# Patient Record
Sex: Male | Born: 1935
Health system: Southern US, Community
[De-identification: ages and names within clinical notes are randomized; demographics above are authoritative.]

## PROBLEM LIST (undated history)

## (undated) DIAGNOSIS — E785 Hyperlipidemia, unspecified: Secondary | ICD-10-CM

## (undated) DIAGNOSIS — D649 Anemia, unspecified: Secondary | ICD-10-CM

## (undated) DIAGNOSIS — E119 Type 2 diabetes mellitus without complications: Secondary | ICD-10-CM

## (undated) DIAGNOSIS — I255 Ischemic cardiomyopathy: Secondary | ICD-10-CM

## (undated) DIAGNOSIS — I6529 Occlusion and stenosis of unspecified carotid artery: Secondary | ICD-10-CM

## (undated) DIAGNOSIS — C189 Malignant neoplasm of colon, unspecified: Secondary | ICD-10-CM

## (undated) DIAGNOSIS — I2510 Atherosclerotic heart disease of native coronary artery without angina pectoris: Secondary | ICD-10-CM

## (undated) HISTORY — PX: INGUINAL HERNIA REPAIR: SUR1180

## (undated) HISTORY — DX: Hyperlipidemia, unspecified: E78.5

## (undated) HISTORY — DX: Type 2 diabetes mellitus without complications: E11.9

## (undated) HISTORY — DX: Anemia, unspecified: D64.9

## (undated) HISTORY — PX: CORONARY ANGIOPLASTY WITH STENT PLACEMENT: SHX49

## (undated) HISTORY — DX: Atherosclerotic heart disease of native coronary artery without angina pectoris: I25.10

## (undated) HISTORY — PX: COLECTOMY: SHX59

## (undated) HISTORY — DX: Ischemic cardiomyopathy: I25.5

## (undated) HISTORY — DX: Occlusion and stenosis of unspecified carotid artery: I65.29

## (undated) HISTORY — PX: CATARACT EXTRACTION W/ INTRAOCULAR LENS  IMPLANT, BILATERAL: SHX1307

---

## 2004-04-18 ENCOUNTER — Ambulatory Visit: Payer: Self-pay | Admitting: Family Medicine

## 2004-07-25 ENCOUNTER — Ambulatory Visit: Payer: Self-pay | Admitting: Family Medicine

## 2004-10-03 ENCOUNTER — Ambulatory Visit: Payer: Self-pay | Admitting: Family Medicine

## 2005-01-04 ENCOUNTER — Ambulatory Visit: Payer: Self-pay | Admitting: Family Medicine

## 2005-02-27 ENCOUNTER — Ambulatory Visit: Payer: Self-pay | Admitting: Family Medicine

## 2005-03-23 ENCOUNTER — Ambulatory Visit: Payer: Self-pay | Admitting: Family Medicine

## 2005-05-24 ENCOUNTER — Ambulatory Visit: Payer: Self-pay | Admitting: Family Medicine

## 2005-08-29 ENCOUNTER — Ambulatory Visit: Payer: Self-pay | Admitting: Family Medicine

## 2005-12-04 ENCOUNTER — Ambulatory Visit: Payer: Self-pay | Admitting: Family Medicine

## 2006-03-12 ENCOUNTER — Ambulatory Visit: Payer: Self-pay | Admitting: Family Medicine

## 2006-05-15 ENCOUNTER — Ambulatory Visit: Payer: Self-pay | Admitting: Family Medicine

## 2006-08-20 ENCOUNTER — Ambulatory Visit: Payer: Self-pay | Admitting: Family Medicine

## 2006-11-26 ENCOUNTER — Ambulatory Visit: Payer: Self-pay | Admitting: Family Medicine

## 2007-12-17 ENCOUNTER — Encounter: Admission: RE | Admit: 2007-12-17 | Discharge: 2007-12-30 | Payer: Self-pay | Admitting: Orthopedic Surgery

## 2009-04-14 ENCOUNTER — Ambulatory Visit: Payer: Self-pay | Admitting: Cardiovascular Disease

## 2009-04-14 ENCOUNTER — Ambulatory Visit: Payer: Self-pay | Admitting: Internal Medicine

## 2009-04-14 ENCOUNTER — Inpatient Hospital Stay (HOSPITAL_COMMUNITY): Admission: RE | Admit: 2009-04-14 | Discharge: 2009-04-20 | Payer: Self-pay | Admitting: Cardiology

## 2009-04-15 ENCOUNTER — Encounter: Payer: Self-pay | Admitting: Cardiovascular Disease

## 2009-04-16 ENCOUNTER — Encounter (INDEPENDENT_AMBULATORY_CARE_PROVIDER_SITE_OTHER): Payer: Self-pay | Admitting: Internal Medicine

## 2009-04-18 ENCOUNTER — Encounter: Payer: Self-pay | Admitting: Cardiology

## 2009-04-30 ENCOUNTER — Encounter: Payer: Self-pay | Admitting: Cardiovascular Disease

## 2009-04-30 DIAGNOSIS — I10 Essential (primary) hypertension: Secondary | ICD-10-CM | POA: Insufficient documentation

## 2009-04-30 DIAGNOSIS — I2510 Atherosclerotic heart disease of native coronary artery without angina pectoris: Secondary | ICD-10-CM

## 2009-04-30 DIAGNOSIS — E119 Type 2 diabetes mellitus without complications: Secondary | ICD-10-CM

## 2009-04-30 DIAGNOSIS — I2589 Other forms of chronic ischemic heart disease: Secondary | ICD-10-CM | POA: Insufficient documentation

## 2009-04-30 DIAGNOSIS — E785 Hyperlipidemia, unspecified: Secondary | ICD-10-CM | POA: Insufficient documentation

## 2009-04-30 DIAGNOSIS — I25118 Atherosclerotic heart disease of native coronary artery with other forms of angina pectoris: Secondary | ICD-10-CM | POA: Insufficient documentation

## 2009-05-04 ENCOUNTER — Ambulatory Visit: Payer: Self-pay | Admitting: Cardiovascular Disease

## 2009-06-21 ENCOUNTER — Ambulatory Visit: Payer: Self-pay | Admitting: Cardiology

## 2009-06-21 ENCOUNTER — Ambulatory Visit: Payer: Self-pay

## 2009-06-21 ENCOUNTER — Encounter: Payer: Self-pay | Admitting: Cardiovascular Disease

## 2009-06-21 ENCOUNTER — Ambulatory Visit (HOSPITAL_COMMUNITY): Admission: RE | Admit: 2009-06-21 | Discharge: 2009-06-21 | Payer: Self-pay | Admitting: Cardiovascular Disease

## 2009-06-21 ENCOUNTER — Ambulatory Visit: Payer: Self-pay | Admitting: Cardiovascular Disease

## 2009-06-23 ENCOUNTER — Encounter: Payer: Self-pay | Admitting: Cardiovascular Disease

## 2009-09-17 ENCOUNTER — Ambulatory Visit: Payer: Self-pay | Admitting: Cardiovascular Disease

## 2009-09-17 DIAGNOSIS — R0989 Other specified symptoms and signs involving the circulatory and respiratory systems: Secondary | ICD-10-CM | POA: Insufficient documentation

## 2009-09-24 ENCOUNTER — Encounter: Payer: Self-pay | Admitting: Cardiovascular Disease

## 2009-10-05 ENCOUNTER — Encounter: Payer: Self-pay | Admitting: Cardiovascular Disease

## 2009-10-05 ENCOUNTER — Ambulatory Visit: Payer: Self-pay

## 2009-11-19 ENCOUNTER — Encounter: Payer: Self-pay | Admitting: Cardiovascular Disease

## 2009-12-27 ENCOUNTER — Ambulatory Visit: Payer: Self-pay | Admitting: Cardiovascular Disease

## 2009-12-27 DIAGNOSIS — I739 Peripheral vascular disease, unspecified: Secondary | ICD-10-CM

## 2010-04-06 ENCOUNTER — Ambulatory Visit: Payer: Self-pay

## 2010-04-06 ENCOUNTER — Encounter: Payer: Self-pay | Admitting: Cardiovascular Disease

## 2010-06-09 ENCOUNTER — Ambulatory Visit
Admission: RE | Admit: 2010-06-09 | Discharge: 2010-06-09 | Payer: Self-pay | Source: Home / Self Care | Attending: Cardiovascular Disease | Admitting: Cardiovascular Disease

## 2010-06-09 ENCOUNTER — Encounter: Payer: Self-pay | Admitting: Cardiovascular Disease

## 2010-07-05 NOTE — Assessment & Plan Note (Signed)
Summary: 3 MONTH/DMILLER   Visit Type:  Follow-up Primary Provider:  dr nyland  CC:  sob with exertion and pt states he stopped taken crestor due to leg and feet pain .  History of Present Illness: The patient is an 77 year old gentleman who presented in November 2010 with an acute anterior wall myocardial infarction. He was found to have a totally occluded LAD at cardiac catheterization. He was treated with bare-metal stent. His LVEF initially was 30%. He also had significant disease in the right coronary artery and left circumflex, that is being managed medically. His LV function recovered post-MI with LVEF 55% on f/u echo.  The pt complains of cough and congestion now for over 2 weeks. He has had white sputum, no fever/chills, mild exertional dyspnea. Denies chest pain or edema. No orthopnea or PND.   He was intolerant to crestor and stopped this medication because of leg pain and weakness.  Current Medications (verified): 1)  Aspirin Ec 325 Mg Tbec (Aspirin) .... Take One Tablet By Mouth Daily 2)  Acetaminophen 325 Mg Tabs (Acetaminophen) .... As Needed 3)  Famotidine 40 Mg Tabs (Famotidine) .... Take 1 Tablet By Mouth Once A Day 4)  Levothyroxine Sodium 100 Mcg Tabs (Levothyroxine Sodium) .... Take 1 Tablet By Mouth Once A Day 5)  Nitrostat 0.4 Mg Subl (Nitroglycerin) .[name].. 1 Tablet Under Tongue At Onset of Chest Pain; You May Repeat Every 5 Minutes For Up To 3 Doses. 6)  Potassium Chloride Crys Cr 20 Meq Cr-Tabs (Potassium Chloride Crys Cr) .... Take One Tablet By Mouth Daily 7)  Lisinopril 10 Mg Tabs (Lisinopril) .... Take One Tablet By Mouth Daily 8)  Carvedilol 12.5 Mg Tabs (Carvedilol) .... Take One Tablet By Mouth Twice A Day 9)  Plavix 75 Mg Tabs (Clopidogrel Bisulfate) .... Take One Tablet By Mouth Daily 10)  Furosemide 40 Mg Tabs (Furosemide) .... Take 1 Tablet By Mouth Two Times A Day  Allergies: No Known Drug Allergies  Past History:  Past medical history reviewed for  relevance to current acute and chronic problems.  Past Medical History: Reviewed history from 05/04/2009 and no changes required. Current Problems:  CAD (ICD-414.00) status post anterior wall MI November 2010 ISCHEMIC CARDIOMYOPATHY (ICD-414.8) ESSENTIAL HYPERTENSION (ICD-401.9) HYPERLIPIDEMIA (ICD-272.4) DIABETES MELLITUS, TYPE II (ICD-250.00)  Review of Systems       Negative except as per HPI   Vital Signs:  Patient profile:   77 year old male Height:      71 inches Weight:      168 pounds Pulse rate:   60 / minute Resp:     18 per minute BP sitting:   144 / 70  (right arm)  Vitals Entered By: Kem Parkinson (September 17, 2009 10:05 AM)  Serial Vital Signs/Assessments:  Time      Position  BP       Pulse  Resp  Temp     By           Lying RA  144/70                         Kimalexis Barnes           Lying LA  140/60                         Kimalexis Barnes   Physical Exam  General:  Pt is alert and oriented, elderly male, in no acute distress. HEENT:  normal Neck: normal carotid upstrokes with a right carotid bruit, JVP normal Lungs: clear with end-expiratory wheezing CV: RRR without murmur or gallop Abd: soft, NT, positive BS, no bruit, no organomegaly Ext: no clubbing, cyanosis, or edema. peripheral pulses 2+ and equal Skin: warm and dry without rash    Impression & Recommendations:  Problem # 1:  CAD (ICD-414.00) Pt stable s/p MI with recovery of LV function. He has no symptoms of angina at present.   Continue current medical therapy - tolerating DAPT without bleeding problems.  His updated medication list for this problem includes:    Aspirin Ec 325 Mg Tbec (Aspirin) .[name]... Take one tablet by mouth daily    Nitrostat 0.4 Mg Subl (Nitroglycerin) .[name]... 1 tablet under tongue at onset of chest pain; you may repeat every 5 minutes for up to 3 doses.    Lisinopril 10 Mg Tabs (Lisinopril) .[name]... Take one tablet by mouth daily    Carvedilol 12.5 Mg Tabs (Carvedilol)  .[name]... Take one tablet by mouth twice a day    Plavix 75 Mg Tabs (Clopidogrel bisulfate) .[name]... Take one tablet by mouth daily  Problem # 2:  CAROTID BRUIT (ICD-785.9) Prominent right carotid bruit on exam. Will check a carotid duplex at the time of his f/u visit in 3 months.  Orders: Carotid Duplex (Carotid Duplex)  Problem # 3:  HYPERLIPIDEMIA (ICD-272.4) Intolerant to Crestor. Lipids were at goal on this medication. Will give him a trial of low-dose Pravachol and ask that lipids repeated with Dr Lysbeth Galas in a few months.  The following medications were removed from the medication list:    Crestor 20 Mg Tabs (Rosuvastatin calcium) .[name]... Take 1 tablet by mouth once a day His updated medication list for this problem includes:    Pravastatin Sodium 20 Mg Tabs (Pravastatin sodium) .[name]... Take one tablet by mouth daily at bedtime  Patient Instructions: 1)  Your physician recommends that you schedule a follow-up appointment in: 3months 2)  Your physician has recommended you make the following change in your medication: please stop your afternoon dose of lasix--also start pravochol 20mg  for your cholesterol and start z-pac for your cold 3)  Your physician has requested that you have a carotid duplex. This test is an ultrasound of the carotid arteries in your neck. It looks at blood flow through these arteries that supply the brain with blood. Allow one hour for this exam. There are no restrictions or special instructions. Prescriptions: PRAVASTATIN SODIUM 20 MG TABS (PRAVASTATIN SODIUM) Take one tablet by mouth daily at bedtime  #90 x 0   Entered by:   Ledon Snare, RN   Authorized by:   Norva Karvonen, MD   Signed by:   Ledon Snare, RN on 09/17/2009   Method used:   Electronically to        [facility] [phone]* (retail)       [address] [place], [place]  [address]       Ph: [phone] or [phone]       Fax: [phone]   RxID:    [id number] ZITHROMAX Z-PAK 250 MG TABS (AZITHROMYCIN) take as directed  #1 x 0   Entered by:   Ledon Snare, RN   Authorized by:   Norva Karvonen, MD   Signed by:   Ledon Snare, RN on 09/17/2009   Method used:   Electronically to  [facility] [phone]* (retail)       [address] [place], [place]  [address]       Ph: [phone] or [phone]       Fax: [phone]   RxID:   [id number]

## 2010-07-05 NOTE — Assessment & Plan Note (Signed)
Summary: per check out/also having echo/saf   Visit Type:  Follow-up Primary Provider:  dr nyland  CC:  No complaints.  History of Present Illness: The patient is an 77 year old gentleman who presented in November 2010 with an acute anterior wall myocardial infarction. He was found to have a totally occluded LAD at cardiac catheterization. He was treated with bare-metal stent. His LVEF initially was 30%. He also had significant disease in the right coronary artery and left circumflex, that is being managed medically. He presents today for followup evaluation.  The patient is feeling well at present. He denies chest pain, dyspnea, edema, outpatient, orthopnea, or PND. He lives in an apartment complex and has been walking down the hallway for exercise. He also rides a stationary bike.   Current Medications (verified): 1)  Aspirin Ec 325 Mg Tbec (Aspirin) .... Take One Tablet By Mouth Daily 2)  Acetaminophen 325 Mg Tabs (Acetaminophen) .... As Needed 3)  Famotidine 40 Mg Tabs (Famotidine) .... Take 1 Tablet By Mouth Once A Day 4)  Levothyroxine Sodium 100 Mcg Tabs (Levothyroxine Sodium) .... Take 1 Tablet By Mouth Once A Day 5)  Nitrostat 0.4 Mg Subl (Nitroglycerin) .[name].. 1 Tablet Under Tongue At Onset of Chest Pain; You May Repeat Every 5 Minutes For Up To 3 Doses. 6)  Potassium Chloride Crys Cr 20 Meq Cr-Tabs (Potassium Chloride Crys Cr) .... Take One Tablet By Mouth Daily 7)  Crestor 20 Mg Tabs (Rosuvastatin Calcium) .... Take 1 Tablet By Mouth Once A Day 8)  Lisinopril 10 Mg Tabs (Lisinopril) .... Take One Tablet By Mouth Daily 9)  Carvedilol 12.5 Mg Tabs (Carvedilol) .... Take One Tablet By Mouth Twice A Day 10)  Plavix 75 Mg Tabs (Clopidogrel Bisulfate) .... Take One Tablet By Mouth Daily 11)  Furosemide 40 Mg Tabs (Furosemide) .... Take 1 Tablet By Mouth Two Times A Day  Allergies (verified): No Known Drug Allergies  Past History:  Past medical history reviewed for relevance to  current acute and chronic problems.  Past Medical History: Reviewed history from 05/04/2009 and no changes required. Current Problems:  CAD (ICD-414.00) status post anterior wall MI November 2010 ISCHEMIC CARDIOMYOPATHY (ICD-414.8) ESSENTIAL HYPERTENSION (ICD-401.9) HYPERLIPIDEMIA (ICD-272.4) DIABETES MELLITUS, TYPE II (ICD-250.00)  Review of Systems       Negative except as per HPI   Vital Signs:  Patient profile:   77 year old male Height:      71 inches Weight:      170.25 pounds Pulse rate:   60 / minute Pulse rhythm:   regular Resp:     18 per minute BP sitting:   130 / 62  (left arm) Cuff size:   large  Vitals Entered By: Vikki Ports (June 21, 2009 11:22 AM)  Physical Exam  General:  Pt is alert and oriented, elderly male, in no acute distress. HEENT: normal Neck: normal carotid upstrokes with a right carotid bruits, JVP normal Lungs: CTA CV: RRR without murmur or gallop Abd: soft, NT, positive BS, no bruit, no organomegaly Ext: no clubbing, cyanosis, or edema. peripheral pulses 2+ and equal Skin: warm and dry without rash    Echocardiogram  Procedure date:  06/21/2009  Findings:      Study Conclusions            - Left ventricle: The cavity size was normal. Wall thickness was       normal. Systolic function was normal. The estimated ejection       fraction was in  the range of 55% to 60%. Wall motion was normal;       there were no regional wall motion abnormalities. Doppler       parameters are consistent with abnormal left ventricular       relaxation (grade 1 diastolic dysfunction).     - Aortic valve: Mildly calcified annulus. Trileaflet. Sclerosis       without significant stenosis. Mean gradient: 9mm Hg (S).     - Mitral valve: Trivial regurgitation.     - Left atrium: The atrium was mildly dilated, LA volume 68 mL.     - Common pulmonary vein: The Doppler velocity and flow profile were       normal.     - Right ventricle: The cavity size  was normal. Systolic function was       normal.     - Pulmonary arteries: PA peak pressure: 28mm Hg (S).     - Inferior vena cava: The vessel was normal in size; the       respirophasic diameter changes were in the normal range (= 50%);       findings are consistent with normal central venous pressure.     Impressions:            - Normal LV size and systolic function, EF 55-60%. No regional wall       motion abnormalities. Mild diastolic dysfunction. Aortic sclerosis       without significant stenosis. No pulmonary hypertension.         Impression & Recommendations:  Problem # 1:  CAD (ICD-414.00) The patient is now 2 months out from his myocardial infarction. His echocardiogram images were reviewed this morning. His initial LVEF was approximately 30%, and this is now improved to the normal range. I do not appreciate any wall motion abnormalities. The LVEF is estimated at 55-60%. I reviewed with the patient that this is an excellent prognostic sign following an anterior infarction. He should remain on dual antiplatelet therapy with aspirin and Plavix, as well as secondary risk reduction measures with his statin, ACE inhibitor, and beta blocker. His updated medication list for this problem includes:    Aspirin Ec 325 Mg Tbec (Aspirin) .[name]... Take one tablet by mouth daily    Nitrostat 0.4 Mg Subl (Nitroglycerin) .[name]... 1 tablet under tongue at onset of chest pain; you may repeat every 5 minutes for up to 3 doses.    Lisinopril 10 Mg Tabs (Lisinopril) .[name]... Take one tablet by mouth daily    Carvedilol 12.5 Mg Tabs (Carvedilol) .[name]... Take one tablet by mouth twice a day    Plavix 75 Mg Tabs (Clopidogrel bisulfate) .[name]... Take one tablet by mouth daily  Problem # 2:  ESSENTIAL HYPERTENSION (ICD-401.9) Blood pressure controlled on current medical regimen. His updated medication list for this problem includes:    Aspirin Ec 325 Mg Tbec (Aspirin) .[name]... Take one tablet by mouth daily    Lisinopril  10 Mg Tabs (Lisinopril) .[name]... Take one tablet by mouth daily    Carvedilol 12.5 Mg Tabs (Carvedilol) .[name]... Take one tablet by mouth twice a day    Furosemide 40 Mg Tabs (Furosemide) .[name]... Take 1 tablet by mouth two times a day  BP today: 130/62 Prior BP: 150/67 (05/04/2009)  Problem # 3:  HYPERLIPIDEMIA (ICD-272.4) The patient is nonfasting today. He was written a prescription for a lipid panel and LFTs. He would like to have this drawn at Dr. Joyce Copa office. Goal LDL is less than  70.  His updated medication list for this problem includes:    Crestor 20 Mg Tabs (Rosuvastatin calcium) .[name]... Take 1 tablet by mouth once a day  Patient Instructions: 1)  Your physician recommends that you schedule a follow-up appointment in:  3 months 2)  Your physician recommends that you return for lab work in: labs will be drawn prior to office visit--CMET/LIVER AND LIPIDS--pt reminded to fast

## 2010-07-05 NOTE — Assessment & Plan Note (Signed)
Summary: 3 month rov   Visit Type:  3 MONTHS FOLLOE UP Primary Provider:  dr nyland  CC:  Chest pain last wednesday morning.  History of Present Illness: The patient is an 77 year old gentleman who presented in November 2010 with an acute anterior wall myocardial infarction. He was found to have a totally occluded LAD at cardiac catheterization. He was treated with bare-metal stent. His LVEF initially was 30%. He also had significant disease in the right coronary artery and left circumflex, that is being managed medically. His LV function recovered post-MI with LVEF 55% on f/u echo.  He developed marked anemia on dual antiplatelet Rx, but this has resolved with Iron supplementation. He apparently declined EGD and colonoscopy. He has been off of ASA and plavix since April 2011. He was also intolerant to statin Rx secondary to leg myalgias.  He had a single episode of chest pain last week when he developed substernal pain in the middle of the night. He sat up and belched, then felt better after less than 10 minutes. Denies exertional chest pain or dyspnea. No edema, dizziness, or other complaints.  Current Medications (verified): 1)  Famotidine 40 Mg Tabs (Famotidine) .... Take 1 Tablet By Mouth Once A Day 2)  Levothyroxine Sodium 100 Mcg Tabs (Levothyroxine Sodium) .... Take 1 Tablet By Mouth Once A Day 3)  Nitrostat 0.4 Mg Subl (Nitroglycerin) .[name].. 1 Tablet Under Tongue At Onset of Chest Pain; You May Repeat Every 5 Minutes For Up To 3 Doses. 4)  Potassium Chloride Crys Cr 20 Meq Cr-Tabs (Potassium Chloride Crys Cr) .... Take One Tablet By Mouth Daily 5)  Lisinopril 10 Mg Tabs (Lisinopril) .... Take One Tablet By Mouth Daily 6)  Carvedilol 12.5 Mg Tabs (Carvedilol) .... Take One Tablet By Mouth Twice A Day 7)  Furosemide 40 Mg Tabs (Furosemide) .... Take 1 Tablet By Mouth Two Times A Day  Allergies (verified): No Known Drug Allergies  Past History:  Past medical history reviewed for  relevance to current acute and chronic problems.  Past Medical History: Current Problems:  CAD (ICD-414.00) status post anterior wall MI November 2010 ISCHEMIC CARDIOMYOPATHY (ICD-414.8) ESSENTIAL HYPERTENSION (ICD-401.9) HYPERLIPIDEMIA (ICD-272.4) DIABETES MELLITUS, TYPE II (ICD-250.00) Carotid Stenosis with probable occlusion of the LICA.  Review of Systems       Negative except as per HPI   Vital Signs:  Patient profile:   77 year old male Height:      71 inches Weight:      173.25 pounds BMI:     24.25 Pulse rate:   55 / minute Pulse rhythm:   regular Resp:     18 per minute BP sitting:   140 / 84  (left arm) Cuff size:   large  Vitals Entered By: Vikki Ports (December 27, 2009 9:41 AM)  Physical Exam  General:  Pt is alert and oriented, in no acute distress. HEENT: normal Neck: normal carotid upstrokes with a right carotid bruit, JVP normal Lungs: CTA CV: RRR without murmur or gallop Abd: soft, NT, positive BS, no bruit, no organomegaly Ext: no clubbing, cyanosis, or edema. peripheral pulses 2+ and equal Skin: warm and dry without rash    EKG  Procedure date:  12/27/2009  Findings:      Sinus brady with rare PAC's, HR 55 bpm, nonspecific ST change, otherwise WNL.  Impression & Recommendations:  Problem # 1:  CAD (ICD-414.00) Stable without exertional angina. I suspect the isolated episode of nocturnal pain last week was GI  in origin since it was transient and resolved with belching. Will follow for now. I spoke with Dr Lysbeth Galas, and decided to restart Mr Fuchs on ASA 81 mg daily. He will continue to have CBC monitoring through Dr Joyce Copa office.  The following medications were removed from the medication list:    Aspirin Ec 325 Mg Tbec (Aspirin) .[name]... Take one tablet by mouth daily    Plavix 75 Mg Tabs (Clopidogrel bisulfate) .[name]... Take one tablet by mouth daily His updated medication list for this problem includes:    Nitrostat 0.4 Mg Subl  (Nitroglycerin) .[name]... 1 tablet under tongue at onset of chest pain; you may repeat every 5 minutes for up to 3 doses.    Lisinopril 10 Mg Tabs (Lisinopril) .[name]... Take one tablet by mouth daily    Carvedilol 12.5 Mg Tabs (Carvedilol) .[name]... Take one tablet by mouth twice a day    Aspirin 81 Mg Tbec (Aspirin) .[name]... Take one tablet by mouth daily  Orders: EKG w/ Interpretation (93000)  Problem # 2:  CAROTID ARTERY DISEASE (ICD-433.10) Pt has probable occlusion of the the LICA. He is asymptomatic. Recommend 6 month f/u carotid duplex and I reviewed stroke/TIA symptoms.  The following medications were removed from the medication list:    Aspirin Ec 325 Mg Tbec (Aspirin) .[name]... Take one tablet by mouth daily    Plavix 75 Mg Tabs (Clopidogrel bisulfate) .[name]... Take one tablet by mouth daily His updated medication list for this problem includes:    Aspirin 81 Mg Tbec (Aspirin) .[name]... Take one tablet by mouth daily  Orders: Carotid Duplex (Carotid Duplex)  Problem # 3:  ESSENTIAL HYPERTENSION (ICD-401.9) BP reasonably controlled on current regimen.  The following medications were removed from the medication list:    Aspirin Ec 325 Mg Tbec (Aspirin) .[name]... Take one tablet by mouth daily His updated medication list for this problem includes:    Lisinopril 10 Mg Tabs (Lisinopril) .[name]... Take one tablet by mouth daily    Carvedilol 12.5 Mg Tabs (Carvedilol) .[name]... Take one tablet by mouth twice a day    Furosemide 40 Mg Tabs (Furosemide) .[name]... Take 1 tablet by mouth two times a day    Aspirin 81 Mg Tbec (Aspirin) .[name]... Take one tablet by mouth daily  BP today: 140/84 Prior BP: 144/70 (09/17/2009)  Problem # 4:  HYPERLIPIDEMIA (ICD-272.4) PT hasn't been able to tolerate statins secondary to myalgias. He has been tried on rosuvastatin and pravastatin. I spoke with Dr Lysbeth Galas and he may give him a trial of another agent.  The following medications were removed from the medication list:    Pravastatin  Sodium 20 Mg Tabs (Pravastatin sodium) .[name]... Take one tablet by mouth daily at bedtime  Patient Instructions: 1)  Start Aspirin 81mg  once daily. 2)  Your physician wants you to follow-up in: 6 months.  You will receive a reminder letter in the mail two months in advance. If you don't receive a letter, please call our office to schedule the follow-up appointment. 3)  Your physician has requested that you have a carotid duplex in November. This test is an ultrasound of the carotid arteries in your neck. It looks at blood flow through these arteries that supply the brain with blood. Allow one hour for this exam. There are no restrictions or special instructions.

## 2010-07-07 NOTE — Assessment & Plan Note (Signed)
Summary: ROV   Visit Type:  Follow-up Primary Provider:  dr nyland  CC:  Chest tightness- cough.  History of Present Illness: The patient is an 77 year old gentleman who presented in November 2010 with an acute anterior wall myocardial infarction. He was found to have a totally occluded LAD at cardiac catheterization. He was treated with bare-metal stent. His LVEF initially was 30%. He also had significant disease in the right coronary artery and left circumflex, that is being managed medically. His LV function recovered post-MI with LVEF 55% on f/u echo.  He developed marked anemia on dual antiplatelet Rx, but this has resolved with Iron supplementation. He apparently declined EGD and colonoscopy.  He had been off of both ASA and plavix, but ASA was restarted at his last visit 3 months ago.  Overall he is doing well, but complains of a productive cough and clear sputum. He has some chest tightness with this, but it is relieved after he coughs. Denies dyspnea or exertional chest pain. He has no symptoms like those of his MI. No edema or other complaints. He notes a recent change from an ACE to ARB because of a cough.     Current Medications (verified): 1)  Famotidine 40 Mg Tabs (Famotidine) .... Take 1 Tablet By Mouth Once A Day 2)  Levothyroxine Sodium 100 Mcg Tabs (Levothyroxine Sodium) .... Take 1 Tablet By Mouth Once A Day 3)  Nitrostat 0.4 Mg Subl (Nitroglycerin) .[name].. 1 Tablet Under Tongue At Onset of Chest Pain; You May Repeat Every 5 Minutes For Up To 3 Doses. 4)  Potassium Chloride Crys Cr 20 Meq Cr-Tabs (Potassium Chloride Crys Cr) .... Take One Tablet By Mouth Daily 5)  Losartan Potassium 100 Mg Tabs (Losartan Potassium) 6)  Carvedilol 12.5 Mg Tabs (Carvedilol) .... Take One Tablet By Mouth Twice A Day 7)  Furosemide 40 Mg Tabs (Furosemide) .... Take 1 Tablet By Mouth Two Times A Day 8)  Aspirin 81 Mg Tbec (Aspirin) .... Take One Tablet By Mouth Daily  Allergies (verified): No  Known Drug Allergies  Past History:  Past medical history reviewed for relevance to current acute and chronic problems.  Past Medical History: Current Problems:  CAD (ICD-414.00) status post anterior wall MI November 2010 ISCHEMIC CARDIOMYOPATHY (ICD-414.8) ESSENTIAL HYPERTENSION (ICD-401.9) HYPERLIPIDEMIA (ICD-272.4) DIABETES MELLITUS, TYPE II (ICD-250.00) Carotid Stenosis with chronic occlusion of the LICA and moderate RICA stenosis. Anemia, Fe deficient  Review of Systems       Negative except as per HPI   Vital Signs:  Patient profile:   77 year old male Height:      71 inches Weight:      177 pounds BMI:     24.78 Pulse rate:   49 / minute Pulse rhythm:   irregular Resp:     18 per minute BP sitting:   194 / 100  (left arm) Cuff size:   large  Vitals Entered By: Vikki Ports (June 09, 2010 10:08 AM)  Physical Exam  General:  Pt is alert and oriented, in no acute distress. HEENT: normal Neck: normal carotid upstrokes with a right carotid bruit, JVP normal Lungs: CTA CV: bradycardic and regular without murmur or gallop Abd: soft, NT, positive BS, no bruit, no organomegaly Ext: no clubbing, cyanosis, or edema. peripheral pulses 2+ and equal Skin: warm and dry without rash    EKG  Procedure date:  06/09/2010  Findings:      Sinus brady 49 bpm, PAC, otherwise within normal limits.  Impression & Recommendations:  Problem # 1:  CAD (ICD-414.00) Pt stable without angina. Continue antiplatelet Rx with ASA and f/u 6 months.  His updated medication list for this problem includes:    Nitrostat 0.4 Mg Subl (Nitroglycerin) .[name]... 1 tablet under tongue at onset of chest pain; you may repeat every 5 minutes for up to 3 doses.    Carvedilol 12.5 Mg Tabs (Carvedilol) .[name]... Take one tablet by mouth twice a day    Aspirin 81 Mg Tbec (Aspirin) .[name]... Take one tablet by mouth daily    Amlodipine Besylate 5 Mg Tabs (Amlodipine besylate) .[name]... Take one tablet by mouth  daily  Orders: EKG w/ Interpretation (93000)  Problem # 2:  ESSENTIAL HYPERTENSION (ICD-401.9) Poorly controlled. Add amlodipine 5 mg. Pt to see Dr Lysbeth Galas next week for followup. Continue coreg and losartan.  His updated medication list for this problem includes:    Losartan Potassium 100 Mg Tabs (Losartan potassium)    Carvedilol 12.5 Mg Tabs (Carvedilol) .[name]... Take one tablet by mouth twice a day    Furosemide 40 Mg Tabs (Furosemide) .[name]... Take 1 tablet by mouth two times a day    Aspirin 81 Mg Tbec (Aspirin) .[name]... Take one tablet by mouth daily    Amlodipine Besylate 5 Mg Tabs (Amlodipine besylate) .[name]... Take one tablet by mouth daily  BP today: 194/100 Prior BP: 140/84 (12/27/2009)  Problem # 3:  CAROTID ARTERY DISEASE (ICD-433.10) Chronic LICA occlusion. No neurologic symptoms. Recent carotid duplex reviewed. Continue surveillance duplex scan at 12 month intervals.  His updated medication list for this problem includes:    Aspirin 81 Mg Tbec (Aspirin) .[name]... Take one tablet by mouth daily  Orders: EKG w/ Interpretation (93000)  Problem # 4:  HYPERLIPIDEMIA (ICD-272.4) Pt statin-intolerant.   Orders: EKG w/ Interpretation (93000)  Patient Instructions: 1)  Your physician has recommended you make the following change in your medication: START Amlodipine 5mg  take one tablet by mouth daily 2)  Your physician wants you to follow-up in: 6 MONTHS.  You will receive a reminder letter in the mail two months in advance. If you don't receive a letter, please call our office to schedule the follow-up appointment. Prescriptions: AMLODIPINE BESYLATE 5 MG TABS (AMLODIPINE BESYLATE) Take one tablet by mouth daily  #90 x 3   Entered by:   Julieta Gutting, RN, BSN   Authorized by:   Norva Karvonen, MD   Signed by:   Julieta Gutting, RN, BSN on 06/09/2010   Method used:   Electronically to        [facility] [phone]* (retail)       [address] [place], [place]  [address]       Ph: [phone] or [phone]       Fax: [phone]   RxID:   [id number]

## 2010-09-07 LAB — CBC
HCT: 37.4 % — ABNORMAL LOW (ref 39.0–52.0)
HCT: 44.8 % (ref 39.0–52.0)
Hemoglobin: 12.8 g/dL — ABNORMAL LOW (ref 13.0–17.0)
Hemoglobin: 14.9 g/dL (ref 13.0–17.0)
Hemoglobin: 15.4 g/dL (ref 13.0–17.0)
MCHC: 34.9 g/dL (ref 30.0–36.0)
MCHC: 34.2 g/dL (ref 30.0–36.0)
MCHC: 34.2 g/dL (ref 30.0–36.0)
MCHC: 34.3 g/dL (ref 30.0–36.0)
MCV: 95.6 fL (ref 78.0–100.0)
MCV: 96.5 fL (ref 78.0–100.0)
Platelets: 119 10*3/uL — ABNORMAL LOW (ref 150–400)
Platelets: 153 10*3/uL (ref 150–400)
Platelets: 195 10*3/uL (ref 150–400)
RBC: 3.25 MIL/uL — ABNORMAL LOW (ref 4.22–5.81)
RBC: 3.87 MIL/uL — ABNORMAL LOW (ref 4.22–5.81)
RBC: 4.51 MIL/uL (ref 4.22–5.81)
RDW: 13.4 % (ref 11.5–15.5)
RDW: 13.5 % (ref 11.5–15.5)
RDW: 13.8 % (ref 11.5–15.5)
WBC: 4 10*3/uL (ref 4.0–10.5)
WBC: 10.3 10*3/uL (ref 4.0–10.5)
WBC: 8.8 10*3/uL (ref 4.0–10.5)

## 2010-09-07 LAB — COMPREHENSIVE METABOLIC PANEL
Alkaline Phosphatase: 59 U/L (ref 39–117)
BUN: 13 mg/dL (ref 6–23)
BUN: 14 mg/dL (ref 6–23)
CO2: 22 mEq/L (ref 19–32)
Calcium: 8.9 mg/dL (ref 8.4–10.5)
Chloride: 103 mEq/L (ref 96–112)
Creatinine, Ser: 1.05 mg/dL (ref 0.4–1.5)
GFR calc non Af Amer: >60 mL/min (ref >60)
GFR calc non Af Amer: >60 mL/min (ref >60)
Glucose, Bld: 196 mg/dL — ABNORMAL HIGH (ref 70–99)
Total Bilirubin: 0.7 mg/dL (ref 0.3–1.2)
Total Protein: 6.9 g/dL (ref 6.0–8.3)

## 2010-09-07 LAB — PROTIME-INR
INR: 1.52 — ABNORMAL HIGH (ref 0.00–1.49)
INR: 1.04 (ref 0.00–1.49)
INR: 5.91 (ref 0.00–1.49)
Prothrombin Time: 18.2 seconds — ABNORMAL HIGH (ref 11.6–15.2)
Prothrombin Time: 13.5 seconds (ref 11.6–15.2)
Prothrombin Time: 13.9 seconds (ref 11.6–15.2)
Prothrombin Time: 52.5 seconds — ABNORMAL HIGH (ref 11.6–15.2)

## 2010-09-07 LAB — GLUCOSE, CAPILLARY
Glucose-Capillary: 114 mg/dL — ABNORMAL HIGH (ref 70–99)
Glucose-Capillary: 115 mg/dL — ABNORMAL HIGH (ref 70–99)
Glucose-Capillary: 123 mg/dL — ABNORMAL HIGH (ref 70–99)
Glucose-Capillary: 124 mg/dL — ABNORMAL HIGH (ref 70–99)
Glucose-Capillary: 128 mg/dL — ABNORMAL HIGH (ref 70–99)
Glucose-Capillary: 136 mg/dL — ABNORMAL HIGH (ref 70–99)
Glucose-Capillary: 152 mg/dL — ABNORMAL HIGH (ref 70–99)
Glucose-Capillary: 195 mg/dL — ABNORMAL HIGH (ref 70–99)

## 2010-09-07 LAB — BASIC METABOLIC PANEL
BUN: 31 mg/dL — ABNORMAL HIGH (ref 6–23)
Calcium: 8.5 mg/dL (ref 8.4–10.5)
Calcium: 8.6 mg/dL (ref 8.4–10.5)
Calcium: 8.8 mg/dL (ref 8.4–10.5)
Creatinine, Ser: 1.39 mg/dL (ref 0.4–1.5)
Creatinine, Ser: 1.35 mg/dL (ref 0.4–1.5)
Creatinine, Ser: 1.36 mg/dL (ref 0.4–1.5)
GFR calc Af Amer: 59 mL/min — ABNORMAL LOW (ref >60)
GFR calc Af Amer: >60 mL/min (ref >60)
GFR calc Af Amer: >60 mL/min (ref >60)
GFR calc non Af Amer: 49 mL/min — ABNORMAL LOW (ref >60)
GFR calc non Af Amer: 50 mL/min — ABNORMAL LOW (ref >60)
GFR calc non Af Amer: 50 mL/min — ABNORMAL LOW (ref >60)
GFR calc non Af Amer: 56 mL/min — ABNORMAL LOW (ref >60)
Glucose, Bld: 136 mg/dL — ABNORMAL HIGH (ref 70–99)
Glucose, Bld: 207 mg/dL — ABNORMAL HIGH (ref 70–99)
Potassium: 4.1 mEq/L (ref 3.5–5.1)
Sodium: 134 mEq/L — ABNORMAL LOW (ref 135–145)
Sodium: 136 mEq/L (ref 135–145)
Sodium: 137 mEq/L (ref 135–145)

## 2010-09-07 LAB — CARDIAC PANEL(CRET KIN+CKTOT+MB+TROPI)
CK, MB: 214.9 ng/mL — ABNORMAL HIGH (ref 0.3–4.0)
CK, MB: 7.5 ng/mL — ABNORMAL HIGH (ref 0.3–4.0)
Troponin I: 0.39 ng/mL — ABNORMAL HIGH (ref 0.00–0.06)

## 2010-09-07 LAB — DIFFERENTIAL
Eosinophils Relative: 0 % (ref 0–5)
Lymphocytes Relative: 11 % — ABNORMAL LOW (ref 12–46)
Lymphs Abs: 1 10*3/uL (ref 0.7–4.0)
Monocytes Absolute: 1 10*3/uL (ref 0.1–1.0)

## 2010-09-07 LAB — LIPID PANEL
Total CHOL/HDL Ratio: 5.6 RATIO
VLDL: 16 mg/dL (ref 0–40)

## 2010-09-07 LAB — HEMOGLOBIN A1C: Hgb A1c MFr Bld: 7.4 % — ABNORMAL HIGH (ref 4.6–6.1)

## 2010-09-07 LAB — TSH: TSH: 1.525 u[IU]/mL (ref 0.350–4.500)

## 2010-09-07 LAB — APTT: aPTT: 135 seconds — ABNORMAL HIGH (ref 24–37)

## 2010-09-07 LAB — POCT I-STAT, CHEM 8
BUN: 14 mg/dL (ref 6–23)
Calcium, Ion: 1.17 mmol/L (ref 1.12–1.32)
TCO2: 22 mmol/L (ref 0–100)

## 2010-09-07 LAB — CK TOTAL AND CKMB (NOT AT ARMC): Total CK: 386 U/L — ABNORMAL HIGH (ref 7–232)

## 2010-09-07 LAB — BRAIN NATRIURETIC PEPTIDE: Pro B Natriuretic peptide (BNP): 1032 pg/mL — ABNORMAL HIGH (ref 0.0–100.0)

## 2010-10-18 NOTE — Letter (Signed)
April 20, 2009    Delaney Meigs, MD  [address]  [place], [place] [address]   RE:  Sean Hartman, Sean Hartman  MRN:  [id number]  /  DOB:  1923/03/02   Dear Darcel Bayley:   This note is to follow up on our conversation regarding Sean Hartman.  As we discussed, Sean Hartman came in on November 10 with a large  anterior wall MI.  He was treated with a bare-metal stent in the  proximal LAD, but had a large infarct with severe LV dysfunction.  His  post MI echo raised a question of LV apical thrombus, but without a  clear diagnosis, I elected not to place him on Coumadin because of the  high bleeding risk on combination of aspirin, Plavix, and Coumadin.  He  is leaving the hospital today on full-dose aspirin 325 mg and Plavix 75  mg.  I wanted to follow up with you because I stopped his metformin in  the setting of new systolic heart failure following his infarct.  [name] is going to follow up with you in the next week or two for  reassessment of his type 2 diabetes.  His blood glucoses have been  running from the 80s into the 160s here in the hospital.  He will  receive a formal discharge summary as well.  Overall, Sean Hartman is  doing quite well considering his advanced age and large infarct size.   He is going to follow up with Dr. Antoine Poche in our [facility].   Darcel Bayley, as always, feel free to call at any time if we can be of  further assistance.    Sincerely,      Veverly Fells. Excell Seltzer, MD  Electronically Signed    MDC/MedQ  DD: 04/20/2009  DT: 04/20/2009  Job #: [id number]   CC:    Rollene Rotunda, MD, [facility]

## 2010-12-23 ENCOUNTER — Encounter: Payer: Self-pay | Admitting: Cardiovascular Disease

## 2010-12-27 ENCOUNTER — Ambulatory Visit (INDEPENDENT_AMBULATORY_CARE_PROVIDER_SITE_OTHER): Payer: 59 | Admitting: Cardiovascular Disease

## 2010-12-27 ENCOUNTER — Encounter: Payer: Self-pay | Admitting: Cardiovascular Disease

## 2010-12-27 DIAGNOSIS — E785 Hyperlipidemia, unspecified: Secondary | ICD-10-CM

## 2010-12-27 DIAGNOSIS — I252 Old myocardial infarction: Secondary | ICD-10-CM

## 2010-12-27 DIAGNOSIS — R0989 Other specified symptoms and signs involving the circulatory and respiratory systems: Secondary | ICD-10-CM

## 2010-12-27 DIAGNOSIS — I2510 Atherosclerotic heart disease of native coronary artery without angina pectoris: Secondary | ICD-10-CM

## 2010-12-27 DIAGNOSIS — I10 Essential (primary) hypertension: Secondary | ICD-10-CM

## 2010-12-27 DIAGNOSIS — R0609 Other forms of dyspnea: Secondary | ICD-10-CM

## 2010-12-27 NOTE — Patient Instructions (Signed)
Your physician wants you to follow-up in: 6 months with Dr. Excell Seltzer. You will receive a reminder letter in the mail two months in advance. If you don't receive a letter, please call our office to schedule the follow-up appointment.  Your physician has requested that you have a lexiscan myoview. For further information please visit [email or web address] Please follow instruction sheet, as given.  Your physician has requested that you have an echocardiogram. Echocardiography is a painless test that uses sound waves to create images of your heart. It provides your doctor with information about the size and shape of your heart and how well your heart's chambers and valves are working. This procedure takes approximately one hour. There are no restrictions for this procedure.

## 2011-01-04 ENCOUNTER — Encounter: Payer: Self-pay | Admitting: *Deleted

## 2011-01-05 ENCOUNTER — Ambulatory Visit (HOSPITAL_COMMUNITY): Payer: Medicare HMO | Attending: Cardiovascular Disease | Admitting: Radiology

## 2011-01-05 ENCOUNTER — Ambulatory Visit (HOSPITAL_COMMUNITY): Payer: 59 | Attending: Cardiovascular Disease | Admitting: Radiology

## 2011-01-05 DIAGNOSIS — E119 Type 2 diabetes mellitus without complications: Secondary | ICD-10-CM | POA: Insufficient documentation

## 2011-01-05 DIAGNOSIS — I2589 Other forms of chronic ischemic heart disease: Secondary | ICD-10-CM | POA: Insufficient documentation

## 2011-01-05 DIAGNOSIS — I10 Essential (primary) hypertension: Secondary | ICD-10-CM | POA: Insufficient documentation

## 2011-01-05 DIAGNOSIS — R0609 Other forms of dyspnea: Secondary | ICD-10-CM | POA: Insufficient documentation

## 2011-01-05 DIAGNOSIS — I252 Old myocardial infarction: Secondary | ICD-10-CM | POA: Insufficient documentation

## 2011-01-05 DIAGNOSIS — E785 Hyperlipidemia, unspecified: Secondary | ICD-10-CM | POA: Insufficient documentation

## 2011-01-05 DIAGNOSIS — R0989 Other specified symptoms and signs involving the circulatory and respiratory systems: Secondary | ICD-10-CM | POA: Insufficient documentation

## 2011-01-05 DIAGNOSIS — I2510 Atherosclerotic heart disease of native coronary artery without angina pectoris: Secondary | ICD-10-CM | POA: Insufficient documentation

## 2011-01-05 DIAGNOSIS — R0602 Shortness of breath: Secondary | ICD-10-CM

## 2011-01-05 MED ORDER — TECHNETIUM TC 99M TETROFOSMIN IV KIT
11.0000 | PACK | Freq: Once | INTRAVENOUS | Status: AC | PRN
  Administered 2011-01-05: 11 via INTRAVENOUS

## 2011-01-05 MED ORDER — TECHNETIUM TC 99M TETROFOSMIN IV KIT
33.0000 | PACK | Freq: Once | INTRAVENOUS | Status: AC | PRN
  Administered 2011-01-05: 33 via INTRAVENOUS

## 2011-01-05 MED ORDER — REGADENOSON 0.4 MG/5ML IV SOLN
0.4000 mg | Freq: Once | INTRAVENOUS | Status: AC
  Administered 2011-01-05: 0.4 mg via INTRAVENOUS

## 2011-01-05 NOTE — Progress Notes (Signed)
[facility] SITE 3 NUCLEAR MED [address] [place] [address] [phone]  Cardiology Nuclear Med Study  SACRAMENTO MONDS is a 77 y.o. male [id number] Aug 16, 1922   Nuclear Med Background Indication for Stress Test:  Evaluation for Ischemia, Stent Patency and PTCA Patency History: 11/10 Angioplasty: LAD, 01/11 Echo: EF 55-60%, 11/10 Heart Catheterization: VFIB, 11/10 Myocardial Infarction: AWMI, '94 Myocardial Perfusion Study: inf defect ? ischemia and 11/10 Stents: LAD with residual Cardiac Risk Factors: Carotid Disease, History of Smoking, Hypertension, Lipids and NIDDM  Symptoms:  DOE   Nuclear Pre-Procedure Caffeine/Decaff Intake:  None NPO After: 9:30pm   Lungs:  clear IV 0.9% NS with Angio Cath:  20g  IV Site: R Antecubital  IV Started by:  Stanton Kidney, EMT-P  Chest Size (in):  42 Cup Size: NA  Height: 5\' 11"  (1.803 m)  Weight:  174 lb (78.926 kg)  BMI:  Body mass index is 24.27 kg/(m^2). Tech Comments:  All meds held this morning, per patient    Nuclear Med Study 1 or 2 day study: 1 day  Stress Test Type:  Eugenie Birks  Reading MD: Olga Millers, MD  Order Authorizing Provider:  M.Cooper  Resting Radionuclide: Technetium 15m Tetrofosmin  Resting Radionuclide Dose: 10.7 mCi   Stress Radionuclide:  Technetium 5m Tetrofosmin  Stress Radionuclide Dose: 33.0 mCi           Stress Protocol Rest HR: 56 Stress HR: 76  Rest BP: 147/72 Stress BP: 132/74  Exercise Time (min): n/a METS: n/a   Predicted Max HR: 135 bpm % Max HR: 56.3 bpm Rate Pressure Product: 21308   Dose of Adenosine (mg):  n/a Dose of Lexiscan: 0.4 mg  Dose of Atropine (mg): n/a Dose of Dobutamine: n/a mcg/kg/min (at max HR)  Stress Test Technologist: Milana Na, EMT-P  Nuclear Technologist:  Doyne Keel, CNMT     Rest Procedure:  Myocardial perfusion imaging was performed at rest 45 minutes following the intravenous administration of Technetium 78m Tetrofosmin. Rest ECG:  Sinus Bradycardia  Stress Procedure:  The patient received IV Lexiscan 0.4 mg over 15-seconds.  Technetium 13m Tetrofosmin injected at 30-seconds.  There were no significant changes with Lexiscan.  Quantitative spect images were obtained after a 45 minute delay. Stress ECG: No significant ST segment change suggestive of ischemia.  QPS Raw Data Images:  Acquisition technically good; normal left ventricular size. Stress Images:  There is decreased uptake in the basal anterior wall, inferoseptal wall and apex. Rest Images:  There is decreased uptake in the basal anterior wall, inferoseptal wall and apex. Subtraction (SDS):  No evidence of ischemia. Transient Ischemic Dilatation (Normal <1.22):  0.99 Lung/Heart Ratio (Normal <0.45):  0.27  Quantitative Gated Spect Images QGS EDV:  98 ml QGS ESV:  40 ml QGS cine images:  NL LV Function; NL Wall Motion QGS EF: 59%  Impression Exercise Capacity:  Lexiscan with no exercise. BP Response:  Normal blood pressure response. Clinical Symptoms:  No chest pain. ECG Impression:  No significant ST segment change suggestive of ischemia. Comparison with Prior Nuclear Study: No images to compare  Overall Impression:  Abnormal stress nuclear study with prior basal anterior, inferoseptal infarcts; no ischemia.   Olga Millers

## 2011-01-06 NOTE — Progress Notes (Signed)
Nuclear report routed to Dr. Cooper. Sean Hartman H  

## 2011-01-08 NOTE — Progress Notes (Signed)
No ischemia noted. Continue current medical therapy. 

## 2011-01-11 ENCOUNTER — Telehealth: Payer: Self-pay | Admitting: Cardiovascular Disease

## 2011-01-11 NOTE — Telephone Encounter (Signed)
Pt aware of echo results 

## 2011-01-11 NOTE — Telephone Encounter (Signed)
Test results

## 2011-01-15 ENCOUNTER — Encounter: Payer: Self-pay | Admitting: Cardiovascular Disease

## 2011-01-15 NOTE — Assessment & Plan Note (Signed)
The patient is statin intolerant. He is treated with WelChol.

## 2011-01-15 NOTE — Progress Notes (Signed)
HPI:  This is an 77 year old gentleman presented for followup evaluation. The patient has a history of coronary artery disease and myocardial infarction. He was treated with PCI. He's had no recurrent ischemic event since his initial presentation a few years ago. However, the patient complains of worsening shortness of breath with exertion. He describes dyspnea with fairly low level exertion such as carrying a jug of milk. He denies chest pain or pressure. He has no edema. His symptoms are unlike those at the time of his myocardial infarction when he experienced significant chest pain.  Outpatient Encounter Prescriptions as of 12/27/2010  Medication Sig Dispense Refill  . amLODipine (NORVASC) 5 MG tablet Take 5 mg by mouth daily.        [name] aspirin (ASPIR-81) 81 MG EC tablet Take 81 mg by mouth daily.        . carvedilol (COREG) 12.5 MG tablet Take 12.5 mg by mouth 2 (two) times daily.        . colesevelam (WELCHOL) 625 MG tablet 1 tab twice  A day       . famotidine (PEPCID) 40 MG tablet Take 40 mg by mouth daily.        . furosemide (LASIX) 40 MG tablet Take 40 mg by mouth 2 (two) times daily.        [name] levothyroxine (SYNTHROID, LEVOTHROID) 100 MCG tablet Take 100 mcg by mouth daily.        [name] losartan (COZAAR) 100 MG tablet Take 100 mg by mouth.        . nitroGLYCERIN (NITROSTAT) 0.4 MG SL tablet Place 0.4 mg under the tongue every 5 (five) minutes as needed.        [name] DISCONTD: potassium chloride (KLOR-CON) 20 MEQ packet Take 20 mEq by mouth daily.          No Known Allergies  Past Medical History  Diagnosis Date  . CAD (coronary artery disease)     status post anterior wall MI November 2010  . Ischemic cardiomyopathy   . Essential hypertension   . HLD (hyperlipidemia)   . DM2 (diabetes mellitus, type 2)   . Carotid stenosis     with chronic occlusion of LICA and moderate RICA stenosis  . Anemia     fe deficient    ROS: Negative except as per HPI  BP 150/74  Pulse 55  Ht 5\' 11"   (1.803 m)  Wt 175 lb (79.379 kg)  BMI 24.41 kg/m2  PHYSICAL EXAM: Pt is alert and oriented,elderly male in NAD HEENT: normal Neck: JVP - normal, carotids 2+= with a right carotid bruit Lungs: CTA bilaterally with prolonged expiration CV: RRR without murmur or gallop Abd: soft, NT, Positive BS, no hepatomegaly Ext: no C/C/E, distal pulses intact and equal Skin: warm/dry no rash  EKG:  Sinus bradycardia 51 beats per minute, nonspecific ST/T-wave abnormality.  ASSESSMENT AND PLAN:

## 2011-01-15 NOTE — Assessment & Plan Note (Signed)
Systolic blood pressure is greater than 140, but in this gentleman and his mid 55s and going to stick with his current medical program which includes carvedilol and losartan. He also is on amlodipine. I don't think we can increase his beta blocker anymore because of resting bradycardia.

## 2011-01-15 NOTE — Assessment & Plan Note (Signed)
The patient does not have clear angina, however he does describe progressive dyspnea. I'm concerned that this may be an anginal equivalent.  Will check an echocardiogram to evaluate for new LV dysfunction or diastolic abnormalities. Also will check a pharmacologic nuclear scan to rule out significant ischemia.

## 2011-01-16 NOTE — Progress Notes (Signed)
Left message to call back  

## 2011-01-16 NOTE — Progress Notes (Signed)
Son notified 

## 2011-01-23 ENCOUNTER — Other Ambulatory Visit: Payer: Self-pay

## 2011-01-23 MED ORDER — CARVEDILOL 12.5 MG PO TABS: 12.5000 mg | ORAL_TABLET | Freq: Two times a day (BID) | ORAL | Status: DC

## 2011-02-22 ENCOUNTER — Other Ambulatory Visit: Payer: Self-pay | Admitting: Cardiovascular Disease

## 2011-04-13 ENCOUNTER — Other Ambulatory Visit: Payer: Self-pay | Admitting: Cardiology

## 2011-04-13 DIAGNOSIS — I6529 Occlusion and stenosis of unspecified carotid artery: Secondary | ICD-10-CM

## 2011-04-17 ENCOUNTER — Encounter (INDEPENDENT_AMBULATORY_CARE_PROVIDER_SITE_OTHER): Payer: Medicare HMO | Admitting: Cardiology

## 2011-04-17 DIAGNOSIS — I6529 Occlusion and stenosis of unspecified carotid artery: Secondary | ICD-10-CM

## 2011-04-18 ENCOUNTER — Other Ambulatory Visit: Payer: Self-pay | Admitting: Cardiovascular Disease

## 2011-06-02 ENCOUNTER — Other Ambulatory Visit: Payer: Self-pay | Admitting: Cardiovascular Disease

## 2011-06-05 ENCOUNTER — Other Ambulatory Visit: Payer: Self-pay | Admitting: Cardiovascular Disease

## 2011-06-29 ENCOUNTER — Ambulatory Visit (INDEPENDENT_AMBULATORY_CARE_PROVIDER_SITE_OTHER): Payer: Medicare HMO | Admitting: Cardiovascular Disease

## 2011-06-29 ENCOUNTER — Encounter: Payer: Self-pay | Admitting: Cardiovascular Disease

## 2011-06-29 DIAGNOSIS — I6529 Occlusion and stenosis of unspecified carotid artery: Secondary | ICD-10-CM

## 2011-06-29 DIAGNOSIS — I2510 Atherosclerotic heart disease of native coronary artery without angina pectoris: Secondary | ICD-10-CM

## 2011-06-29 DIAGNOSIS — I10 Essential (primary) hypertension: Secondary | ICD-10-CM

## 2011-06-29 NOTE — Patient Instructions (Signed)
Your physician has requested that you have a carotid duplex in 6 MONTHS. This test is an ultrasound of the carotid arteries in your neck. It looks at blood flow through these arteries that supply the brain with blood. Allow one hour for this exam. There are no restrictions or special instructions.  Your physician wants you to follow-up in: 6 MONTHS. You will receive a reminder letter in the mail two months in advance. If you don't receive a letter, please call our office to schedule the follow-up appointment.  Your physician recommends that you continue on your current medications as directed. Please refer to the Current Medication list given to you today.  Please send [place] a copy of your recent lab results.

## 2011-06-29 NOTE — Progress Notes (Signed)
HPI:  This is an 78 year old gentleman presenting for followup of coronary artery disease. The patient has a history of myocardial infarction treated with primary PCI.  His left ventricular function has been preserved. He is doing well at present and specifically denies chest pain or pressure. He denies dyspnea, edema, orthopnea, or PND. The patient has no complaints today.  Outpatient Encounter Prescriptions as of 06/29/2011  Medication Sig Dispense Refill  . amLODipine (NORVASC) 5 MG tablet TAKE ONE TABLET BY MOUTH ONE TIME DAILY  90 tablet  3  . aspirin (ASPIR-81) 81 MG EC tablet Take 81 mg by mouth daily.        . carvedilol (COREG) 12.5 MG tablet TAKE ONE TABLET BY MOUTH TWICE A DAY  60 tablet  5  . colesevelam (WELCHOL) 625 MG tablet 1 tab twice  A day       . famotidine (PEPCID) 40 MG tablet TAKE ONE TABLET BY MOUTH ONE TIME DAILY  30 tablet  9  . furosemide (LASIX) 40 MG tablet TAKE ONE TABLET BY MOUTH TWICE DAILY  60 tablet  10  . levothyroxine (SYNTHROID, LEVOTHROID) 100 MCG tablet Take 100 mcg by mouth daily.        [name] losartan (COZAAR) 100 MG tablet Take 100 mg by mouth.        . nitroGLYCERIN (NITROSTAT) 0.4 MG SL tablet Place 0.4 mg under the tongue every 5 (five) minutes as needed.          No Known Allergies  Past Medical History  Diagnosis Date  . CAD (coronary artery disease)     status post anterior wall MI November 2010  . Ischemic cardiomyopathy   . Essential hypertension   . HLD (hyperlipidemia)   . DM2 (diabetes mellitus, type 2)   . Carotid stenosis     with chronic occlusion of LICA and moderate RICA stenosis  . Anemia     fe deficient    ROS: Negative except as per HPI  BP 122/66  Pulse 51  Ht 5\' 10"  (1.778 m)  Wt 79.561 kg (175 lb 6.4 oz)  BMI 25.17 kg/m2  PHYSICAL EXAM: Pt is alert and oriented, pleasant elderly man in NAD HEENT: normal Neck: JVP - normal, carotids 2+= with bilateral bruits Lungs: CTA bilaterally CV: RRR without murmur or  gallop Abd: soft, NT, Positive BS, no hepatomegaly Ext: no C/C/E, distal pulses intact and equal Skin: warm/dry no rash  EKG:  Sinus bradycardia with first degree AV block, heart rate 51 beats per minute, otherwise within normal limits.  MYOVIEW: Quantitative Gated Spect Images  QGS EDV: 98 ml  QGS ESV: 40 ml  QGS cine images: NL LV Function; NL Wall Motion  QGS EF: 59%  Impression  Exercise Capacity: Lexiscan with no exercise.  BP Response: Normal blood pressure response.  Clinical Symptoms: No chest pain.  ECG Impression: No significant ST segment change suggestive of ischemia.  Comparison with Prior Nuclear Study: No images to compare  Overall Impression: Abnormal stress nuclear study with prior basal anterior, inferoseptal infarcts; no ischemia.   ASSESSMENT AND PLAN:

## 2011-07-05 NOTE — Assessment & Plan Note (Signed)
The patient has total occlusion of the left internal carotid artery. He has never had a neurologic event. He has moderate right internal carotid artery stenosis and is followed with serial imaging.

## 2011-07-05 NOTE — Assessment & Plan Note (Signed)
Well-controlled on current medical therapy. 

## 2011-07-05 NOTE — Assessment & Plan Note (Signed)
The patient is stable without anginal symptoms. He will continue on his current medical program which includes aspirin, and beta blocker, and an ARB.

## 2011-09-21 ENCOUNTER — Other Ambulatory Visit: Payer: Self-pay | Admitting: *Deleted

## 2011-09-21 MED ORDER — CARVEDILOL 12.5 MG PO TABS: 12.5000 mg | ORAL_TABLET | Freq: Two times a day (BID) | ORAL | Status: DC

## 2011-12-27 ENCOUNTER — Ambulatory Visit: Payer: Medicare HMO | Admitting: Cardiovascular Disease

## 2012-01-03 ENCOUNTER — Encounter (INDEPENDENT_AMBULATORY_CARE_PROVIDER_SITE_OTHER): Payer: Medicare HMO

## 2012-01-03 ENCOUNTER — Ambulatory Visit (INDEPENDENT_AMBULATORY_CARE_PROVIDER_SITE_OTHER): Payer: Medicare HMO | Admitting: Cardiovascular Disease

## 2012-01-03 ENCOUNTER — Ambulatory Visit (INDEPENDENT_AMBULATORY_CARE_PROVIDER_SITE_OTHER)
Admission: RE | Admit: 2012-01-03 | Discharge: 2012-01-03 | Disposition: A | Discharge status: Home / Self Care | Payer: Medicare HMO | Source: Ambulatory Visit | Attending: Cardiovascular Disease | Admitting: Cardiovascular Disease

## 2012-01-03 ENCOUNTER — Encounter: Payer: Self-pay | Admitting: Cardiovascular Disease

## 2012-01-03 VITALS — BP 175/85 | HR 55 | Ht 70.5 in | Wt 171.0 lb

## 2012-01-03 DIAGNOSIS — R05 Cough: Secondary | ICD-10-CM

## 2012-01-03 DIAGNOSIS — I10 Essential (primary) hypertension: Secondary | ICD-10-CM

## 2012-01-03 DIAGNOSIS — I6529 Occlusion and stenosis of unspecified carotid artery: Secondary | ICD-10-CM

## 2012-01-03 DIAGNOSIS — R0602 Shortness of breath: Secondary | ICD-10-CM

## 2012-01-03 DIAGNOSIS — I2510 Atherosclerotic heart disease of native coronary artery without angina pectoris: Secondary | ICD-10-CM

## 2012-01-03 DIAGNOSIS — R0989 Other specified symptoms and signs involving the circulatory and respiratory systems: Secondary | ICD-10-CM

## 2012-01-03 MED ORDER — AZITHROMYCIN 250 MG PO TABS: ORAL_TABLET | ORAL | Status: AC

## 2012-01-03 NOTE — Patient Instructions (Addendum)
Please have a chest x-ray performed at the [place] office located on [facility].  Your physician has recommended you make the following change in your medication: START Z-pak  Your physician wants you to follow-up in: 6 MONTHS with Dr Excell Seltzer. You will receive a reminder letter in the mail two months in advance. If you don't receive a letter, please call our office to schedule the follow-up appointment.

## 2012-01-03 NOTE — Assessment & Plan Note (Signed)
Known carotid disease with no prior history of stroke or TIA. Continue conservative management.

## 2012-01-03 NOTE — Assessment & Plan Note (Signed)
Suspect this is multifactorial. I don't appreciate signs of heart failure, but he probably has diastolic dysfunction considering his ischemic heart disease and advanced age. His exam is suggestive of some component of COPD. With this prolonged cough I think it would be reasonable to give him a course of oral antibiotics as he has failed conservative management. I'm going to have him get a chest x-ray today. He follows up with Dr. Lysbeth Galas next week and this should give some time to see if he has improved with these measures. A prescription for azithromycin will be written.

## 2012-01-03 NOTE — Progress Notes (Signed)
   HPI:  Mr. Sean Hartman returns for followup. He is an 78 year old gentleman with coronary artery disease who presented with an anterior MI about 3 years ago. He was treated with stenting of the LAD and has been noted to have preserved LV function. He was noted to have moderately tight left circumflex stenosis at the time of cardiac cath and he has been treated medically. His LAD was treated with a bare-metal stent platform. A followup echocardiogram in August 2012 demonstrated normal left ventricular systolic function with an ejection fraction of 55-60% and no significant valvular disease.  The patient complains of continued shortness of breath. He's had a cough now for greater than one month. He has taken Mucinex but continues to have productive cough with clear sputum. His breathing has been a little bit worse. He's had no exertional chest pain. He's had some pain in the chest that resolves when he coughs up sputum. He denies orthopnea, PND, or leg swelling. He's had no fever or chills.  Outpatient Encounter Prescriptions as of 01/03/2012  Medication Sig Dispense Refill  . amLODipine (NORVASC) 5 MG tablet TAKE ONE TABLET BY MOUTH ONE TIME DAILY  90 tablet  3  . aspirin (ASPIR-81) 81 MG EC tablet Take 81 mg by mouth daily.        . carvedilol (COREG) 12.5 MG tablet Take 1 tablet (12.5 mg total) by mouth 2 (two) times daily with a meal.  60 tablet  5  . famotidine (PEPCID) 40 MG tablet TAKE ONE TABLET BY MOUTH ONE TIME DAILY  30 tablet  9  . furosemide (LASIX) 40 MG tablet TAKE ONE TABLET BY MOUTH TWICE DAILY  60 tablet  10  . levothyroxine (SYNTHROID, LEVOTHROID) 100 MCG tablet Take 100 mcg by mouth daily.        [name] losartan (COZAAR) 100 MG tablet Take 100 mg by mouth.        . nitroGLYCERIN (NITROSTAT) 0.4 MG SL tablet Place 0.4 mg under the tongue every 5 (five) minutes as needed.        [name] DISCONTD: colesevelam (WELCHOL) 625 MG tablet 1 tab twice  A day         No Known Allergies  Past Medical  History  Diagnosis Date  . CAD (coronary artery disease)     status post anterior wall MI November 2010  . Ischemic cardiomyopathy   . Essential hypertension   . HLD (hyperlipidemia)   . DM2 (diabetes mellitus, type 2)   . Carotid stenosis     with chronic occlusion of LICA and moderate RICA stenosis  . Anemia     fe deficient    ROS: Negative except as per HPI  BP 175/85  Pulse 55  Ht 5' 10.5" (1.791 m)  Wt 171 lb (77.565 kg)  BMI 24.19 kg/m2  PHYSICAL EXAM: Pt is alert and oriented, pleasant elderly male in NAD HEENT: normal Neck: JVP - normal, carotids 2+= with soft bilateral bruits Lungs: CTA bilaterally but prolonged expiratory phase noted CV: RRR without murmur or gallop, distant heart sounds Abd: soft, NT, Positive BS, no hepatomegaly Ext: no C/C/E, distal pulses intact and equal Skin: warm/dry no rash  EKG:  Sinus bradycardia 51 beats per minute, first degree AV block, otherwise within normal limits the  ASSESSMENT AND PLAN:

## 2012-01-03 NOTE — Assessment & Plan Note (Signed)
Blood pressure is elevated today, but the patient is not feeling well and he has a respiratory infection. I'm not going to change his antihypertensive medicines today

## 2012-01-03 NOTE — Assessment & Plan Note (Signed)
The patient is stable without angina. His most recent echocardiogram was reviewed and shows normal LV function. He will continue on his same medications and followup in 6 months

## 2012-01-29 ENCOUNTER — Other Ambulatory Visit: Payer: Self-pay | Admitting: Cardiovascular Disease

## 2012-03-15 ENCOUNTER — Other Ambulatory Visit: Payer: Self-pay | Admitting: Cardiovascular Disease

## 2012-03-15 NOTE — Telephone Encounter (Signed)
Fax Received. Refill Completed. Sean Hartman (R.M.A)   

## 2012-04-22 ENCOUNTER — Emergency Department (HOSPITAL_COMMUNITY): Payer: Medicare HMO

## 2012-04-22 ENCOUNTER — Encounter (HOSPITAL_COMMUNITY): Payer: Self-pay | Admitting: Emergency Medicine

## 2012-04-22 ENCOUNTER — Emergency Department (HOSPITAL_COMMUNITY)
Admission: EM | Admit: 2012-04-22 | Discharge: 2012-04-22 | Disposition: A | Discharge status: Home / Self Care | Payer: Medicare HMO | Attending: Emergency Medicine | Admitting: Emergency Medicine

## 2012-04-22 DIAGNOSIS — S0003XA Contusion of scalp, initial encounter: Secondary | ICD-10-CM | POA: Insufficient documentation

## 2012-04-22 DIAGNOSIS — E119 Type 2 diabetes mellitus without complications: Secondary | ICD-10-CM | POA: Insufficient documentation

## 2012-04-22 DIAGNOSIS — Z87891 Personal history of nicotine dependence: Secondary | ICD-10-CM | POA: Insufficient documentation

## 2012-04-22 DIAGNOSIS — R059 Cough, unspecified: Secondary | ICD-10-CM | POA: Insufficient documentation

## 2012-04-22 DIAGNOSIS — E785 Hyperlipidemia, unspecified: Secondary | ICD-10-CM | POA: Insufficient documentation

## 2012-04-22 DIAGNOSIS — Z79899 Other long term (current) drug therapy: Secondary | ICD-10-CM | POA: Insufficient documentation

## 2012-04-22 DIAGNOSIS — I10 Essential (primary) hypertension: Secondary | ICD-10-CM | POA: Insufficient documentation

## 2012-04-22 DIAGNOSIS — I658 Occlusion and stenosis of other precerebral arteries: Secondary | ICD-10-CM | POA: Insufficient documentation

## 2012-04-22 DIAGNOSIS — Y9389 Activity, other specified: Secondary | ICD-10-CM | POA: Insufficient documentation

## 2012-04-22 DIAGNOSIS — Y9289 Other specified places as the place of occurrence of the external cause: Secondary | ICD-10-CM | POA: Insufficient documentation

## 2012-04-22 DIAGNOSIS — I2510 Atherosclerotic heart disease of native coronary artery without angina pectoris: Secondary | ICD-10-CM | POA: Insufficient documentation

## 2012-04-22 DIAGNOSIS — S0083XA Contusion of other part of head, initial encounter: Secondary | ICD-10-CM | POA: Insufficient documentation

## 2012-04-22 DIAGNOSIS — R42 Dizziness and giddiness: Secondary | ICD-10-CM | POA: Insufficient documentation

## 2012-04-22 DIAGNOSIS — R404 Transient alteration of awareness: Secondary | ICD-10-CM | POA: Insufficient documentation

## 2012-04-22 DIAGNOSIS — R296 Repeated falls: Secondary | ICD-10-CM | POA: Insufficient documentation

## 2012-04-22 DIAGNOSIS — R05 Cough: Secondary | ICD-10-CM | POA: Insufficient documentation

## 2012-04-22 DIAGNOSIS — Z7982 Long term (current) use of aspirin: Secondary | ICD-10-CM | POA: Insufficient documentation

## 2012-04-22 DIAGNOSIS — R55 Syncope and collapse: Secondary | ICD-10-CM | POA: Insufficient documentation

## 2012-04-22 DIAGNOSIS — I2589 Other forms of chronic ischemic heart disease: Secondary | ICD-10-CM | POA: Insufficient documentation

## 2012-04-22 DIAGNOSIS — D509 Iron deficiency anemia, unspecified: Secondary | ICD-10-CM | POA: Insufficient documentation

## 2012-04-22 LAB — CBC WITH DIFFERENTIAL/PLATELET
Basophils Relative: 0 % (ref 0–1)
Eosinophils Absolute: 0.2 10*3/uL (ref 0.0–0.7)
Eosinophils Relative: 3 % (ref 0–5)
Lymphs Abs: 1.2 10*3/uL (ref 0.7–4.0)
MCH: 32 pg (ref 26.0–34.0)
MCHC: 35 g/dL (ref 30.0–36.0)
MCV: 91.5 fL (ref 78.0–100.0)
Platelets: 170 10*3/uL (ref 150–400)
RBC: 4.37 MIL/uL (ref 4.22–5.81)
RDW: 12.8 % (ref 11.5–15.5)

## 2012-04-22 LAB — POCT I-STAT TROPONIN I

## 2012-04-22 LAB — BASIC METABOLIC PANEL
Calcium: 9.5 mg/dL (ref 8.4–10.5)
GFR calc Af Amer: 84 mL/min — ABNORMAL LOW (ref >90)
GFR calc non Af Amer: 72 mL/min — ABNORMAL LOW (ref >90)
Glucose, Bld: 152 mg/dL — ABNORMAL HIGH (ref 70–99)
Sodium: 139 mEq/L (ref 135–145)

## 2012-04-22 NOTE — ED Provider Notes (Signed)
Medical screening examination/treatment/procedure(s) were conducted as a shared visit with non-physician practitioner(s) and myself.  I personally evaluated the patient during the encounter  Patient's labs and x-rays reviewed. Patient evaluated. Large frontal hematoma. Patient likely with cough syncope. Stable for discharge  Toy Baker, MD 04/22/12 [id number]

## 2012-04-22 NOTE — ED Notes (Signed)
Passed out and hit his head has lasrge bruise to rt forehead and cheek

## 2012-04-22 NOTE — ED Provider Notes (Signed)
History     CSN: [id number]  Arrival date & time 04/22/12  1131   First MD Initiated Contact with Patient 04/22/12 1532      No chief complaint on file.   (Consider location/radiation/quality/duration/timing/severity/associated sxs/prior treatment) HPI Comments: Patient presents today after he had a syncopal episode this morning.  Patient reports that he was sitting down and began coughing very hard.  He states that he then became dizzy and had a syncopal event.  He did hit his forehead on the floor when he fell.  He thinks that he loss consciousness lasted for approximately one minute.  He was ambulatory after the event and did not have any symptoms after.  He is currently asymptomatic.  He denies SOB and CP.  He denies dizziness and lightheadedness at this time.  He does have a hematoma on the right side of his forehead.  He is having mild pain at the site of the hematoma, but no other pain.  He denies nausea, vomiting, or vision changes.  Denies neck or back pain.   He is currently not on any blood thinning medication, aside from a daily 81 mg Aspirin.  He reports that he has had a productive cough for several months.  He has a history of COPD.  Denies fever or chills.    The history is provided by the patient.    Past Medical History  Diagnosis Date  . CAD (coronary artery disease)     status post anterior wall MI November 2010  . Ischemic cardiomyopathy   . Essential hypertension   . HLD (hyperlipidemia)   . DM2 (diabetes mellitus, type 2)   . Carotid stenosis     with chronic occlusion of LICA and moderate RICA stenosis  . Anemia     fe deficient    Past Surgical History  Procedure Date  . Left inguinal hernia repair   . Percutaneous transluminal coronary angioplasty and bare-metal stent to left anterior descending     No family history on file.  History  Substance Use Topics  . Smoking status: Former Games developer  . Smokeless tobacco: Never Used  . Alcohol Use: No       Review of Systems  Constitutional: Negative for fever and chills.  HENT: Negative for neck pain and neck stiffness.   Eyes: Negative for visual disturbance.  Respiratory: Positive for cough. Negative for chest tightness, shortness of breath and wheezing.   Cardiovascular: Negative for chest pain, palpitations and leg swelling.  Gastrointestinal: Negative for nausea and vomiting.  Musculoskeletal: Negative for back pain and gait problem.  Skin:       Forehead hematoma  Neurological: Positive for dizziness and syncope. Negative for seizures, facial asymmetry, speech difficulty, weakness, numbness and headaches.  All other systems reviewed and are negative.    Allergies  Review of patient's allergies indicates no known allergies.  Home Medications   Current Outpatient Rx  Name  Route  Sig  Dispense  Refill  . AMLODIPINE BESYLATE 5 MG PO TABS   Oral   Take 5 mg by mouth daily.         . ASPIRIN 81 MG PO TBEC   Oral   Take 81 mg by mouth daily.           [name] CARVEDILOL 12.5 MG PO TABS   Oral   Take 12.5 mg by mouth 2 (two) times daily with a meal.         . MUCINEX DM PO  Oral   Take 10 mLs by mouth every 8 (eight) hours as needed. For cough         . FAMOTIDINE 40 MG PO TABS   Oral   Take 40 mg by mouth daily.         . FUROSEMIDE 40 MG PO TABS   Oral   Take 40 mg by mouth 2 (two) times daily.         [name] LEVOTHYROXINE SODIUM 100 MCG PO TABS   Oral   Take 100 mcg by mouth daily.           [name] LOSARTAN POTASSIUM 100 MG PO TABS   Oral   Take 100 mg by mouth daily.          [name] NITROGLYCERIN 0.4 MG SL SUBL   Sublingual   Place 0.4 mg under the tongue every 5 (five) minutes as needed.             BP 162/58  Pulse 60  Temp 98.3 F (36.8 C) (Oral)  Resp 20  SpO2 94%  Physical Exam  Nursing note and vitals reviewed. Constitutional: He appears well-developed and well-nourished. No distress.  HENT:  Head:    Mouth/Throat: Oropharynx is  clear and moist.  Eyes: Conjunctivae normal and EOM are normal. Pupils are equal, round, and reactive to light.  Neck: Normal range of motion. Neck supple.  Cardiovascular: Normal rate, regular rhythm and normal heart sounds.   Pulmonary/Chest: Effort normal and breath sounds normal.  Abdominal: Soft. There is no tenderness.  Musculoskeletal: Normal range of motion.       Cervical back: He exhibits normal range of motion, no tenderness, no bony tenderness, no swelling and no edema.       Thoracic back: He exhibits normal range of motion, no tenderness, no bony tenderness, no swelling, no edema and no deformity.       Lumbar back: He exhibits normal range of motion, no tenderness, no bony tenderness, no swelling, no edema and no deformity.       Full ROM of all extremities without pain  Neurological: He is alert. He has normal strength. No cranial nerve deficit or sensory deficit. Gait normal.  Skin: Skin is warm and dry. He is not diaphoretic.  Psychiatric: He has a normal mood and affect.    ED Course  Procedures (including critical care time)  Labs Reviewed  BASIC METABOLIC PANEL - Abnormal; Notable for the following:    Glucose, Bld 152 (*)     GFR calc non Af Amer 72 (*)     GFR calc Af Amer 84 (*)     All other components within normal limits  CBC WITH DIFFERENTIAL  POCT I-STAT TROPONIN I    Date: 04/23/2012  Rate:  58  Rhythm: atrial flutter  QRS Axis: normal  Intervals: normal  ST/T Wave abnormalities: normal  Conduction Disutrbances:none  Narrative Interpretation:   Old EKG Reviewed: changes noted   Dg Chest 2 View  04/22/2012  *RADIOLOGY REPORT*  Clinical Data: Cough, dizziness  CHEST - 2 VIEW  Comparison: 01/03/2012  Findings: Chronic left perihilar and lingular densities, suspect scarring.  Stable borderline heart size but normal vascularity.  No CHF or definite pneumonia.  Mild hyperinflation noted, suspect background COPD/emphysema.  No effusion or pneumothorax.   Trachea is midline. Atherosclerosis noted of the aorta.  Mild scoliosis of the spine.  IMPRESSION: Chronic lingula scarring.  Hyperinflation.  No superimposed acute process   Original Report  Authenticated By: Judie Petit. Miles Costain, M.D.    Ct Head Wo Contrast  04/22/2012  *RADIOLOGY REPORT*  Clinical Data: Fall,  large scalp hematoma.  CT HEAD WITHOUT CONTRAST  Technique:  Contiguous axial images were obtained from the base of the skull through the vertex without contrast.  Comparison: None.  Findings: Right frontal large scalp hematoma noted with swelling. No underlying depressed skull fracture.  Mild brain atrophy noted.  No acute intracranial hemorrhage, mass lesion, infarction, midline shift, herniation, or extra-axial fluid collection.  Gray-white matter differentiation maintained. Cisterns patent.  No cerebellar abnormality.  Symmetric orbits. Extensive maxillary sinus opacification compatible with chronic sinus disease and a.  Mastoids clear.  IMPRESSION: Large right frontal scalp hematoma.  No underlying depressed fracture or acute intracranial hemorrhage.  Mild brain atrophy.   Original Report Authenticated By: Judie Petit. Miles Costain, M.D.      No diagnosis found.    MDM  Patient presenting today after a syncopal event that occurred after coughing really hard.  He did hit his head and had a hematoma on his forehead.  CT head negative.  No acute findings on CXR.  Labs unremarkable.  No ischemic changes on EKG.  Troponin negative.  Patient is completely asymptomatic upon arrival in the ED.  Patient did not have any CP or SOB with this syncopal event.  Therefore, feel that patient can be discharged home.  Return precautions discussed.        Pascal Lux [place], PA-C 04/23/12 1535

## 2012-04-22 NOTE — ED Notes (Signed)
Has a bad cold coughed and got dizzy and passed out

## 2012-04-23 NOTE — ED Provider Notes (Signed)
Medical screening examination/treatment/procedure(s) were conducted as a shared visit with non-physician practitioner(s) and myself.  I personally evaluated the patient during the encounter  Sou Nohr T Kemesha Mosey, MD 04/23/12 2240 

## 2012-05-27 ENCOUNTER — Other Ambulatory Visit: Payer: Self-pay | Admitting: Cardiovascular Disease

## 2012-07-04 ENCOUNTER — Encounter: Payer: Self-pay | Admitting: Cardiovascular Disease

## 2012-07-04 ENCOUNTER — Ambulatory Visit (INDEPENDENT_AMBULATORY_CARE_PROVIDER_SITE_OTHER): Payer: Medicare Other | Admitting: Cardiovascular Disease

## 2012-07-04 VITALS — BP 146/78 | HR 63 | Ht 70.0 in | Wt 176.0 lb

## 2012-07-04 DIAGNOSIS — I6529 Occlusion and stenosis of unspecified carotid artery: Secondary | ICD-10-CM

## 2012-07-04 DIAGNOSIS — I2510 Atherosclerotic heart disease of native coronary artery without angina pectoris: Secondary | ICD-10-CM

## 2012-07-04 NOTE — Patient Instructions (Addendum)
Your physician wants you to follow-up in: 6 MONTHS with Dr Cooper.  You will receive a reminder letter in the mail two months in advance. If you don't receive a letter, please call our office to schedule the follow-up appointment.  Your physician has requested that you have a carotid duplex in 6 MONTHS. This test is an ultrasound of the carotid arteries in your neck. It looks at blood flow through these arteries that supply the brain with blood. Allow one hour for this exam. There are no restrictions or special instructions.  Your physician recommends that you continue on your current medications as directed. Please refer to the Current Medication list given to you today.   

## 2012-07-04 NOTE — Progress Notes (Signed)
HPI:  79 year old gentleman presenting for followup evaluation. The patient has coronary artery disease and he initially presented with an anterior wall MI in 2010. He was treated with stenting of the LAD. He had residual coronary disease in the left circumflex and this was treated medically.  Overall patient is doing well. He did have a syncopal episode in December that was associated with heavy coughing. He's had no further episodes of lightheadedness or syncope. He denies chest pain or pressure. He has chronic dyspnea unchanged over time. He was prescribed Spiriva and this has helped. He's been statin intolerant. Dr. Lysbeth Galas gave him some WelChol samples and I have encouraged him to start this.  Outpatient Encounter Prescriptions as of 07/04/2012  Medication Sig Dispense Refill  . amLODipine (NORVASC) 5 MG tablet Take 5 mg by mouth daily.      [name] amLODipine (NORVASC) 5 MG tablet TAKE ONE TABLET BY MOUTH ONE  TIME DAILY  90 tablet  2  . aspirin (ASPIR-81) 81 MG EC tablet Take 81 mg by mouth daily.        . carvedilol (COREG) 12.5 MG tablet Take 12.5 mg by mouth 2 (two) times daily with a meal.      . Dextromethorphan-Guaifenesin (MUCINEX DM PO) Take 10 mLs by mouth every 8 (eight) hours as needed. For cough      . famotidine (PEPCID) 40 MG tablet Take 40 mg by mouth daily.      . furosemide (LASIX) 40 MG tablet Take 40 mg by mouth 2 (two) times daily.      [name] levothyroxine (SYNTHROID, LEVOTHROID) 100 MCG tablet Take 100 mcg by mouth daily.        [name] losartan (COZAAR) 100 MG tablet Take 100 mg by mouth daily.       . nitroGLYCERIN (NITROSTAT) 0.4 MG SL tablet Place 0.4 mg under the tongue every 5 (five) minutes as needed.        [name] SPIRIVA HANDIHALER 18 MCG inhalation capsule Take one tablet daily        No Known Allergies  Past Medical History  Diagnosis Date  . CAD (coronary artery disease)     status post anterior wall MI November 2010  . Ischemic cardiomyopathy   . Essential hypertension    . HLD (hyperlipidemia)   . DM2 (diabetes mellitus, type 2)   . Carotid stenosis     with chronic occlusion of LICA and moderate RICA stenosis  . Anemia     fe deficient   ROS: Positive for knee and ankle pain bilaterally, otherwise negative except as per HPI  BP 146/78  Pulse 63  Ht 5\' 10"  (1.778 m)  Wt 79.833 kg (176 lb)  BMI 25.25 kg/m2  SpO2 94%  PHYSICAL EXAM: Pt is alert and oriented, pleasant elderly male in NAD HEENT: normal Neck: JVP - normal, carotids 2+= with a right carotid bruit Lungs: Clear with prolonged expiration CV: RRR without murmur or gallop Abd: soft, NT, Positive BS, no hepatomegaly Ext: no C/C/E, distal pulses intact and equal Skin: warm/dry no rash  ASSESSMENT AND PLAN: 1. Coronary artery disease, native vessel. The patient is stable without anginal symptoms. We'll continue his current medical program.  2. Essential hypertension. He's on a combination of amlodipine and carvedilol as well as losartan. Will continue his same medical program. Recent labs show creatinine of 0.9. Potassium was 3.6.  3. Carotid stenosis. The patient has chronic occlusion of the left internal carotid artery and 60-79% stenosis on  the right. We are going to repeat her carotid duplex scan when he returns in 6 months.  4. Hyperlipidemia. He is statin intolerant. Recent lipids showed a cholesterol of 195, triglycerides 1:30, HDL 37, and LDL 132. He is going to start WelChol.  Tonny Bollman 07/04/2012 12:50 PM

## 2012-07-06 ENCOUNTER — Other Ambulatory Visit: Payer: Self-pay | Admitting: Cardiovascular Disease

## 2012-07-15 ENCOUNTER — Encounter (INDEPENDENT_AMBULATORY_CARE_PROVIDER_SITE_OTHER): Payer: Medicare Other

## 2012-07-15 DIAGNOSIS — I2510 Atherosclerotic heart disease of native coronary artery without angina pectoris: Secondary | ICD-10-CM

## 2012-07-15 DIAGNOSIS — I6529 Occlusion and stenosis of unspecified carotid artery: Secondary | ICD-10-CM

## 2012-09-12 ENCOUNTER — Other Ambulatory Visit: Payer: Self-pay | Admitting: Cardiovascular Disease

## 2012-11-21 ENCOUNTER — Encounter: Payer: Self-pay | Admitting: Cardiovascular Disease

## 2012-11-28 ENCOUNTER — Other Ambulatory Visit: Payer: Self-pay | Admitting: Cardiovascular Disease

## 2012-11-28 NOTE — Telephone Encounter (Signed)
Medication filled per Julieta Gutting, RN

## 2012-12-30 ENCOUNTER — Ambulatory Visit: Payer: Medicare Other | Admitting: Cardiovascular Disease

## 2012-12-31 ENCOUNTER — Ambulatory Visit: Payer: Medicare Other | Admitting: Cardiovascular Disease

## 2013-01-10 ENCOUNTER — Encounter: Payer: Self-pay | Admitting: Cardiovascular Disease

## 2013-01-10 ENCOUNTER — Ambulatory Visit (INDEPENDENT_AMBULATORY_CARE_PROVIDER_SITE_OTHER): Payer: Medicare Other | Admitting: Cardiovascular Disease

## 2013-01-10 VITALS — BP 132/60 | HR 58 | Ht 71.0 in | Wt 169.4 lb

## 2013-01-10 DIAGNOSIS — I252 Old myocardial infarction: Secondary | ICD-10-CM

## 2013-01-10 DIAGNOSIS — I2510 Atherosclerotic heart disease of native coronary artery without angina pectoris: Secondary | ICD-10-CM

## 2013-01-10 MED ORDER — FLUTICASONE PROPIONATE 50 MCG/ACT NA SUSP: 2.0000 | Freq: Every day | NASAL | Status: DC

## 2013-01-10 NOTE — Progress Notes (Signed)
HPI:  79 year old gentleman presenting for followup evaluation. The patient is followed for coronary artery disease. He's had an anterior MI with good recovery of LV function. He also has carotid disease with chronic occlusion of the left internal carotid artery and 40-59% stenosis of the right internal carotid artery. He has no history of stroke or TIA. His last carotid duplex was in February 2014 and 1 year followup is recommended.  His only complaint is sinus congestion and difficulty breathing at night. He denies chest pain, cough, orthopnea, PND, edema, amaurosis, or extremity weakness. We discussed his history of statin intolerance and he cannot take these medications because of severe myalgias and weakness. He was unable to take WelChol because of constipation.  Outpatient Encounter Prescriptions as of 01/10/2013  Medication Sig Dispense Refill  . amLODipine (NORVASC) 5 MG tablet Take 5 mg by mouth daily.      [name] aspirin (ASPIR-81) 81 MG EC tablet Take 81 mg by mouth daily.        . carvedilol (COREG) 12.5 MG tablet TAKE ONE TABLET BY MOUTH TWICE A DAY WITH MEALS  60 tablet  4  . Dextromethorphan-Guaifenesin (MUCINEX DM PO) Take 10 mLs by mouth every 8 (eight) hours as needed. For cough      . famotidine (PEPCID) 40 MG tablet TAKE ONE TABLET BY MOUTH ONE TIME DAILY  30 tablet  8  . furosemide (LASIX) 40 MG tablet Take 40 mg by mouth 2 (two) times daily.      [name] levothyroxine (SYNTHROID, LEVOTHROID) 100 MCG tablet Take 100 mcg by mouth daily.        [name] losartan (COZAAR) 100 MG tablet Take 100 mg by mouth daily.       . nitroGLYCERIN (NITROSTAT) 0.4 MG SL tablet Place 0.4 mg under the tongue every 5 (five) minutes as needed.        [name] SPIRIVA HANDIHALER 18 MCG inhalation capsule Take one tablet daily      . fluticasone (FLONASE) 50 MCG/ACT nasal spray Place 2 sprays into the nose daily.  16 g  6  . [DISCONTINUED] famotidine (PEPCID) 40 MG tablet Take 40 mg by mouth daily.      . [DISCONTINUED]  furosemide (LASIX) 40 MG tablet TAKE ONE TABLET BY MOUTH TWICE DAILY  60 tablet  10   No facility-administered encounter medications on file as of 01/10/2013.    No Known Allergies  Past Medical History  Diagnosis Date  . CAD (coronary artery disease)     status post anterior wall MI November 2010  . Ischemic cardiomyopathy   . Essential hypertension   . HLD (hyperlipidemia)   . DM2 (diabetes mellitus, type 2)   . Carotid stenosis     with chronic occlusion of LICA and moderate RICA stenosis  . Anemia     fe deficient    ROS: Negative except as per HPI  BP 132/60  Pulse 58  Ht 5\' 11"  (1.803 m)  Wt 76.839 kg (169 lb 6.4 oz)  BMI 23.64 kg/m2  SpO2 94%  PHYSICAL EXAM: Pt is alert and oriented, pleasant elderly man in NAD HEENT: normal Neck: JVP - normal, carotids 2+= with bilateral bruits, high-pitched on the left Lungs: CTA bilaterally CV: RRR without murmur or gallop Abd: soft, NT, Positive BS, no hepatomegaly Ext: no C/C/E, distal pulses intact and equal Skin: warm/dry no rash  EKG:  Sinus rhythm 58 beats per minute, within normal limits. Rare PAC.  ASSESSMENT AND PLAN: 1. Coronary  artery disease, native vessel. The patient is stable without anginal symptoms. He will continue on aspirin, carvedilol, and losartan. I'll see him back in 6 months.  2. Carotid stenosis without history of stroke or TIA. Followup carotid duplex in February 2015 with an office visit that same day. Continue risk reduction measures.  3. Hypertension. Blood pressure is well controlled on his current program which includes a calcium blocker, beta blocker, and ARB.  4. Sinus congestion. He requested antibiotics. There is no sign of infection. I wrote him a prescription for Flonase.  He brought labs in for review today. Labs from June 19 showed a creatinine of 1.03, normal liver function tests, cholesterol 205, trig was read 163, HDL 31, and LDL 141. We discussed statin drugs and non-statin  lipid-lowering drugs. He is not interested in cholesterol-lowering medication after discussion of the pros and cons.  Tonny Bollman 01/10/2013 1:23 PM

## 2013-01-10 NOTE — Patient Instructions (Addendum)
Your physician wants you to follow-up in: February 2015 You will receive a reminder letter in the mail two months in advance. If you don't receive a letter, please call our office to schedule the follow-up appointment.   Start flonase two sprays to each nostril once daily  Your physician has requested that you have a carotid duplex. This test is an ultrasound of the carotid arteries in your neck. It looks at blood flow through these arteries that supply the brain with blood. Allow one hour for this exam. There are no restrictions or special instructions. Schedule in February sameday as appt with dr Excell Seltzer

## 2013-02-07 ENCOUNTER — Ambulatory Visit: Payer: Medicare Other | Admitting: Cardiovascular Disease

## 2013-02-11 ENCOUNTER — Other Ambulatory Visit: Payer: Self-pay | Admitting: Cardiovascular Disease

## 2013-02-28 ENCOUNTER — Other Ambulatory Visit: Payer: Self-pay | Admitting: Cardiovascular Disease

## 2013-07-08 ENCOUNTER — Ambulatory Visit (HOSPITAL_COMMUNITY): Payer: Medicare HMO | Attending: Cardiology

## 2013-07-08 DIAGNOSIS — I658 Occlusion and stenosis of other precerebral arteries: Secondary | ICD-10-CM | POA: Insufficient documentation

## 2013-07-08 DIAGNOSIS — I2510 Atherosclerotic heart disease of native coronary artery without angina pectoris: Secondary | ICD-10-CM | POA: Insufficient documentation

## 2013-07-08 DIAGNOSIS — E119 Type 2 diabetes mellitus without complications: Secondary | ICD-10-CM | POA: Insufficient documentation

## 2013-07-08 DIAGNOSIS — I10 Essential (primary) hypertension: Secondary | ICD-10-CM | POA: Insufficient documentation

## 2013-07-08 DIAGNOSIS — E785 Hyperlipidemia, unspecified: Secondary | ICD-10-CM | POA: Insufficient documentation

## 2013-07-08 DIAGNOSIS — I6529 Occlusion and stenosis of unspecified carotid artery: Secondary | ICD-10-CM | POA: Insufficient documentation

## 2013-07-09 ENCOUNTER — Encounter: Payer: Self-pay | Admitting: Cardiovascular Disease

## 2013-07-09 ENCOUNTER — Ambulatory Visit (INDEPENDENT_AMBULATORY_CARE_PROVIDER_SITE_OTHER): Payer: Medicare HMO | Admitting: Cardiovascular Disease

## 2013-07-09 VITALS — BP 134/58 | HR 64 | Ht 71.0 in | Wt 170.4 lb

## 2013-07-09 DIAGNOSIS — I2510 Atherosclerotic heart disease of native coronary artery without angina pectoris: Secondary | ICD-10-CM

## 2013-07-09 NOTE — Patient Instructions (Signed)
Your physician recommends that you continue on your current medications as directed. Please refer to the Current Medication list given to you today.  Your physician wants you to follow-up in: 6 months with Dr. Cooper.  You will receive a reminder letter in the mail two months in advance. If you don't receive a letter, please call our office to schedule the follow-up appointment.  

## 2013-07-09 NOTE — Progress Notes (Signed)
HPI:  80 year old gentleman presenting for followup evaluation. The patient has coronary artery disease and he initially presented with an anterior MI in 2010. He was treated with PCI of the LAD and has done well since that time. He also has carotid stenosis with chronic occlusion of the left internal carotid artery and moderate stenosis of the right internal carotid artery. He has no history of clinical stroke or TIA.  The patient has been doing well. He denies cough, shortness of breath, or chest pain. He's had no leg swelling. He does experience pain in both ankles and lower legs with ambulation. No symptoms with standing. Pain resolves with rest.  Outpatient Encounter Prescriptions as of 07/09/2013  Medication Sig  . amLODipine (NORVASC) 5 MG tablet Take 5 mg by mouth daily.  [name] aspirin (ASPIR-81) 81 MG EC tablet Take 81 mg by mouth daily.    . carvedilol (COREG) 12.5 MG tablet TAKE ONE TABLET BY MOUTH TWICE A DAY WITH MEALS  . Dextromethorphan-Guaifenesin (MUCINEX DM PO) Take 10 mLs by mouth every 8 (eight) hours as needed. For cough  . famotidine (PEPCID) 40 MG tablet TAKE ONE TABLET BY MOUTH ONE TIME DAILY  . fluticasone (FLONASE) 50 MCG/ACT nasal spray Place 2 sprays into the nose daily.  . furosemide (LASIX) 40 MG tablet Take 40 mg by mouth 2 (two) times daily.  [name] levothyroxine (SYNTHROID, LEVOTHROID) 100 MCG tablet Take 100 mcg by mouth daily.    [name] losartan (COZAAR) 100 MG tablet Take 100 mg by mouth daily.   . nitroGLYCERIN (NITROSTAT) 0.4 MG SL tablet Place 0.4 mg under the tongue every 5 (five) minutes as needed.    [name] SPIRIVA HANDIHALER 18 MCG inhalation capsule Take one tablet daily  . [DISCONTINUED] amLODipine (NORVASC) 5 MG tablet TAKE ONE TABLET BY MOUTH ONE TIME DAILY    No Known Allergies  Past Medical History  Diagnosis Date  . CAD (coronary artery disease)     status post anterior wall MI November 2010  . Ischemic cardiomyopathy   . Essential hypertension   . HLD  (hyperlipidemia)   . DM2 (diabetes mellitus, type 2)   . Carotid stenosis     with chronic occlusion of LICA and moderate RICA stenosis  . Anemia     fe deficient    ROS: Negative except as per HPI  BP 134/58  Pulse 64  Ht 5\' 11"  (1.803 m)  Wt 170 lb 6.4 oz (77.293 kg)  BMI 23.78 kg/m2  SpO2 96%  PHYSICAL EXAM: Pt is alert and oriented, pleasant elderly male in NAD HEENT: normal Neck: JVP - normal, carotids 2+= with bilateral bruits Lungs: CTA bilaterally CV: RRR without murmur or gallop Abd: soft, NT, Positive BS, no hepatomegaly Ext: no C/C/E, distal pulses intact and equal Skin: warm/dry no rash  ASSESSMENT AND PLAN: 1. Coronary artery disease, native vessel. The patient is stable without symptoms of angina. He will continue on his current medical program. This was reviewed today and includes aspirin, a beta blocker, calcium channel blocker, and losartan. He's had presumed GI blood loss on dual antiplatelet therapy.  2. Carotid stenosis with no history of stroke. Will repeat carotid duplex in 1 year. I reviewed his recent study which demonstrated stability of the right internal carotid artery and chronic occlusion of the left.  3. Hyperlipidemia. He is unable to tolerate lipid-lowering therapy. He has tried statin and on statin drugs and has had side effects to all of these.  4. Hypertension.  Blood pressure is well controlled on his current regimen.  For followup I will see him back in 6 months.  [name] 07/09/2013 1:59 PM

## 2013-07-14 ENCOUNTER — Other Ambulatory Visit: Payer: Self-pay | Admitting: Cardiovascular Disease

## 2013-08-27 ENCOUNTER — Other Ambulatory Visit: Payer: Self-pay | Admitting: Cardiovascular Disease

## 2013-09-02 ENCOUNTER — Other Ambulatory Visit: Payer: Self-pay | Admitting: Cardiovascular Disease

## 2013-09-02 NOTE — Telephone Encounter (Signed)
Is this ok to refill for patient? Please advise. Thanks, MI

## 2013-09-17 DIAGNOSIS — E1159 Type 2 diabetes mellitus with other circulatory complications: Secondary | ICD-10-CM | POA: Insufficient documentation

## 2013-09-17 DIAGNOSIS — I152 Hypertension secondary to endocrine disorders: Secondary | ICD-10-CM | POA: Insufficient documentation

## 2013-09-17 DIAGNOSIS — E1169 Type 2 diabetes mellitus with other specified complication: Secondary | ICD-10-CM | POA: Insufficient documentation

## 2013-10-16 ENCOUNTER — Encounter: Payer: Self-pay | Admitting: Cardiovascular Disease

## 2013-10-16 NOTE — Telephone Encounter (Signed)
This encounter was created in error - please disregard.

## 2013-10-16 NOTE — Telephone Encounter (Signed)
Left message on machine for pt's son to contact the office and speak with a scheduler. The pt needs to be scheduled for a ROV with Dr Burt Knack in [place].

## 2013-10-16 NOTE — Telephone Encounter (Signed)
New Message:  Pt's son is requesting his father be worked in sometime in August or September for a f/u w/ Burt Knack. Requests a call back from the nurse

## 2013-11-10 ENCOUNTER — Other Ambulatory Visit: Payer: Self-pay | Admitting: Cardiovascular Disease

## 2013-11-13 ENCOUNTER — Other Ambulatory Visit: Payer: Self-pay | Admitting: Cardiovascular Disease

## 2013-11-28 ENCOUNTER — Other Ambulatory Visit: Payer: Self-pay | Admitting: Cardiovascular Disease

## 2013-12-17 DIAGNOSIS — J449 Chronic obstructive pulmonary disease, unspecified: Secondary | ICD-10-CM | POA: Insufficient documentation

## 2014-02-06 ENCOUNTER — Other Ambulatory Visit (HOSPITAL_COMMUNITY): Payer: Self-pay | Admitting: *Deleted

## 2014-02-06 DIAGNOSIS — I6529 Occlusion and stenosis of unspecified carotid artery: Secondary | ICD-10-CM

## 2014-02-18 ENCOUNTER — Encounter: Payer: Self-pay | Admitting: Cardiovascular Disease

## 2014-02-18 ENCOUNTER — Ambulatory Visit (INDEPENDENT_AMBULATORY_CARE_PROVIDER_SITE_OTHER): Payer: Medicare HMO | Admitting: Cardiovascular Disease

## 2014-02-18 ENCOUNTER — Ambulatory Visit (HOSPITAL_COMMUNITY): Payer: Medicare HMO | Attending: Cardiovascular Disease | Admitting: Cardiology

## 2014-02-18 VITALS — BP 112/68 | HR 51 | Ht 71.0 in | Wt 163.0 lb

## 2014-02-18 DIAGNOSIS — I2510 Atherosclerotic heart disease of native coronary artery without angina pectoris: Secondary | ICD-10-CM | POA: Diagnosis not present

## 2014-02-18 DIAGNOSIS — I10 Essential (primary) hypertension: Secondary | ICD-10-CM | POA: Insufficient documentation

## 2014-02-18 DIAGNOSIS — E119 Type 2 diabetes mellitus without complications: Secondary | ICD-10-CM | POA: Insufficient documentation

## 2014-02-18 DIAGNOSIS — I2589 Other forms of chronic ischemic heart disease: Secondary | ICD-10-CM

## 2014-02-18 DIAGNOSIS — E785 Hyperlipidemia, unspecified: Secondary | ICD-10-CM | POA: Insufficient documentation

## 2014-02-18 DIAGNOSIS — I6529 Occlusion and stenosis of unspecified carotid artery: Secondary | ICD-10-CM | POA: Diagnosis not present

## 2014-02-18 NOTE — Progress Notes (Signed)
Carotid duplex performed 

## 2014-02-18 NOTE — Patient Instructions (Signed)
Your physician wants you to follow-up in: 1 YEAR with Dr Burt Knack.  You will receive a reminder letter in the mail two months in advance. If you don't receive a letter, please call our office to schedule the follow-up appointment.  Your physician has requested that you have a carotid duplex in 1 YEAR. This test is an ultrasound of the carotid arteries in your neck. It looks at blood flow through these arteries that supply the brain with blood. Allow one hour for this exam. There are no restrictions or special instructions.  Your physician recommends that you continue on your current medications as directed. Please refer to the Current Medication list given to you today.

## 2014-02-18 NOTE — Progress Notes (Signed)
HPI:  80 year old gentleman presenting for followup evaluation. The patient has coronary artery disease and he initially presented with an anterior MI in 2010. He was treated with PCI of the LAD and has done well since that time. He also has carotid stenosis with chronic occlusion of the left internal carotid artery and moderate stenosis of the right internal carotid artery. He has no history of clinical stroke or TIA.  The patient is doing well. He denies chest pain or pressure. No lightheadedness, dizziness, amaurosis fugax, speech problems, or extremity weakness. His biggest limitation relates to pain in his feet. He has no cardiac related complaints today. Chronic dyspnea is unchanged.   Outpatient Encounter Prescriptions as of 02/18/2014  Medication Sig  . amLODipine (NORVASC) 5 MG tablet TAKE ONE TABLET BY MOUTH ONE TIME DAILY  . aspirin (ASPIR-81) 81 MG EC tablet Take 81 mg by mouth daily.    . carvedilol (COREG) 12.5 MG tablet TAKE ONE TABLET BY MOUTH TWICE A DAY WITH MEALS  . colesevelam (WELCHOL) 625 MG tablet Take 1,875 mg by mouth as needed.  [name] Dextromethorphan-Guaifenesin (MUCINEX DM PO) Take 10 mLs by mouth every 8 (eight) hours as needed. For cough  . famotidine (PEPCID) 40 MG tablet TAKE ONE TABLET BY MOUTH ONE TIME DAILY  . fluticasone (FLONASE) 50 MCG/ACT nasal spray Place 2 sprays into the nose daily.  . furosemide (LASIX) 40 MG tablet TAKE ONE TABLET BY MOUTH TWICE DAILY  . levothyroxine (SYNTHROID, LEVOTHROID) 100 MCG tablet Take 100 mcg by mouth daily.    [name] losartan (COZAAR) 100 MG tablet Take 100 mg by mouth daily.   . nitroGLYCERIN (NITROSTAT) 0.4 MG SL tablet Place 0.4 mg under the tongue every 5 (five) minutes as needed.    . sitaGLIPtin (JANUVIA) 100 MG tablet Take 100 mg by mouth as needed.  [name] SPIRIVA HANDIHALER 18 MCG inhalation capsule Take one tablet daily  . [DISCONTINUED] amLODipine (NORVASC) 5 MG tablet Take 5 mg by mouth daily.  . [DISCONTINUED] amLODipine  (NORVASC) 5 MG tablet TAKE ONE TABLET BY MOUTH ONE TIME DAILY  . [DISCONTINUED] furosemide (LASIX) 40 MG tablet Take 40 mg by mouth 2 (two) times daily.  . [DISCONTINUED] furosemide (LASIX) 40 MG tablet TAKE ONE TABLET BY MOUTH TWICE DAILY    No Known Allergies  Past Medical History  Diagnosis Date  . CAD (coronary artery disease)     status post anterior wall MI November 2010  . Ischemic cardiomyopathy   . Essential hypertension   . HLD (hyperlipidemia)   . DM2 (diabetes mellitus, type 2)   . Carotid stenosis     with chronic occlusion of LICA and moderate RICA stenosis  . Anemia     fe deficient    ROS: Negative except as per HPI  BP 112/68  Pulse 51  Ht 5\' 11"  (1.803 m)  Wt 163 lb (73.936 kg)  BMI 22.74 kg/m2  PHYSICAL EXAM: Pt is alert and oriented, elderly gentleman in NAD HEENT: normal Neck: JVP - normal, carotids 2+= with a right carotid bruit Lungs: CTA bilaterally CV: RRR without murmur or gallop, distant heart sounds Abd: soft, NT, Positive BS, no hepatomegaly Ext: no C/C/E Skin: warm/dry no rash  EKG:  Sinus bradycardia 51 beats per minute, otherwise within normal limits.  ASSESSMENT AND PLAN: 1. Coronary artery disease, native vessel. The patient is stable without symptoms of angina. He will continue on his current medical program. This was reviewed today and includes aspirin at  low dose, a beta blocker, calcium channel blocker, and losartan.   2. Carotid stenosis with no history of stroke. Preliminary report from carotid study earlier today demonstrated stability with chronic left internal carotid artery occlusion and moderate 60-79% stenosis on the right. No stroke or TIA symptoms.  3. Hyperlipidemia. He is unable to tolerate lipid-lowering therapy. He has tried statin and non-statin drugs and has had side effects to all of these.   4. Hypertension. Blood pressure is well controlled on his current regimen.   I will see him back in one year with a  carotid duplex scan the same day as his office visit.   [name] MD 02/18/2014 12:03 PM

## 2014-03-04 ENCOUNTER — Other Ambulatory Visit: Payer: Self-pay

## 2014-03-04 MED ORDER — AMLODIPINE BESYLATE 5 MG PO TABS: ORAL_TABLET | ORAL | Status: DC

## 2014-03-10 ENCOUNTER — Other Ambulatory Visit: Payer: Self-pay | Admitting: *Deleted

## 2014-03-10 MED ORDER — FUROSEMIDE 40 MG PO TABS: ORAL_TABLET | ORAL | Status: DC

## 2014-04-27 ENCOUNTER — Other Ambulatory Visit: Payer: Self-pay | Admitting: Cardiovascular Disease

## 2014-04-28 ENCOUNTER — Other Ambulatory Visit: Payer: Self-pay | Admitting: Cardiovascular Disease

## 2014-05-12 ENCOUNTER — Other Ambulatory Visit: Payer: Self-pay | Admitting: Cardiovascular Disease

## 2014-11-26 ENCOUNTER — Other Ambulatory Visit: Payer: Self-pay | Admitting: Cardiovascular Disease

## 2015-03-24 ENCOUNTER — Other Ambulatory Visit: Payer: Self-pay | Admitting: Cardiovascular Disease

## 2015-03-24 DIAGNOSIS — I6523 Occlusion and stenosis of bilateral carotid arteries: Secondary | ICD-10-CM

## 2015-03-25 ENCOUNTER — Other Ambulatory Visit: Payer: Self-pay | Admitting: Cardiovascular Disease

## 2015-03-31 ENCOUNTER — Ambulatory Visit (HOSPITAL_COMMUNITY)
Admission: RE | Admit: 2015-03-31 | Discharge: 2015-03-31 | Disposition: A | Discharge status: Home / Self Care | Payer: Medicare HMO | Source: Ambulatory Visit | Attending: Cardiology | Admitting: Cardiology

## 2015-03-31 ENCOUNTER — Ambulatory Visit (INDEPENDENT_AMBULATORY_CARE_PROVIDER_SITE_OTHER): Payer: Medicare HMO | Admitting: Cardiovascular Disease

## 2015-03-31 ENCOUNTER — Encounter: Payer: Self-pay | Admitting: Cardiovascular Disease

## 2015-03-31 VITALS — BP 142/78 | HR 44 | Ht 71.0 in | Wt 161.0 lb

## 2015-03-31 DIAGNOSIS — I6523 Occlusion and stenosis of bilateral carotid arteries: Secondary | ICD-10-CM | POA: Diagnosis not present

## 2015-03-31 DIAGNOSIS — I10 Essential (primary) hypertension: Secondary | ICD-10-CM

## 2015-03-31 DIAGNOSIS — E785 Hyperlipidemia, unspecified: Secondary | ICD-10-CM | POA: Insufficient documentation

## 2015-03-31 DIAGNOSIS — R001 Bradycardia, unspecified: Secondary | ICD-10-CM

## 2015-03-31 DIAGNOSIS — E119 Type 2 diabetes mellitus without complications: Secondary | ICD-10-CM | POA: Insufficient documentation

## 2015-03-31 MED ORDER — CARVEDILOL 6.25 MG PO TABS: 6.2500 mg | ORAL_TABLET | Freq: Two times a day (BID) | ORAL | Status: DC

## 2015-03-31 NOTE — Progress Notes (Signed)
Cardiology Office Note Date:  04/02/2015   ID:  Sean Hartman, DOB 07/21/1922, MRN [id number]  PCP:  [name], MD  Cardiologist:  [name], MD    Chief Complaint  Patient presents with  . Leg Pain   History of Present Illness: Sean Hartman is a 81 y.o. male who presents for followup evaluation. The patient has coronary artery disease and he initially presented with an anterior MI in 2010. He was treated with PCI of the LAD and has done well since that time. He also has carotid stenosis with chronic occlusion of the left internal carotid artery and moderate stenosis of the right internal carotid artery. He has no history of clinical stroke or TIA.   the patient is doing well. He recently celebrated his 89th birthday. His primary limitation is bilateral foot pain with walking. He denies shortness of breath or chest pain  At rest. He denies lightheadedness, dizziness, or syncope.  He does admit to shortness of breath with activity, unchanged over time. He denies orthopnea or PND. He denies leg swelling.  Past Medical History  Diagnosis Date  . CAD (coronary artery disease)     status post anterior wall MI November 2010  . Ischemic cardiomyopathy   . Essential hypertension   . HLD (hyperlipidemia)   . DM2 (diabetes mellitus, type 2) ([place])   . Carotid stenosis     with chronic occlusion of LICA and moderate RICA stenosis  . Anemia     fe deficient    Past Surgical History  Procedure Laterality Date  . Left inguinal hernia repair    . Percutaneous transluminal coronary angioplasty and bare-metal stent to left anterior descending      Current Outpatient Prescriptions  Medication Sig Dispense Refill  . amLODipine (NORVASC) 5 MG tablet TAKE ONE TABLET BY MOUTH ONE  TIME DAILY 90 tablet 1  . aspirin (ASPIR-81) 81 MG EC tablet Take 81 mg by mouth daily.      . carvedilol (COREG) 6.25 MG tablet Take 1 tablet (6.25 mg total) by mouth 2 (two) times daily  with a meal. 60 tablet 11  . famotidine (PEPCID) 40 MG tablet TAKE ONE TABLET BY MOUTH ONE TIME DAILY 30 tablet 0  . furosemide (LASIX) 40 MG tablet TAKE ONE TABLET BY MOUTH TWICE DAILY 60 tablet 10  . levothyroxine (SYNTHROID, LEVOTHROID) 100 MCG tablet Take 100 mcg by mouth daily.      [name] losartan (COZAAR) 100 MG tablet Take 100 mg by mouth daily.     . nitroGLYCERIN (NITROSTAT) 0.4 MG SL tablet DISSOLVE 1 TABLET UNDER TONGUE EVERY 5 MINUTES AS NEEDED FOR CHEST PAIN UP TO 3 DOSES 25 tablet 6  . Dextromethorphan-Guaifenesin (MUCINEX DM PO) Take 10 mLs by mouth every 8 (eight) hours as needed. For cough    . fluticasone (FLONASE) 50 MCG/ACT nasal spray Place 2 sprays into the nose daily. (Patient not taking: Reported on 03/31/2015) 16 g 6  . sitaGLIPtin (JANUVIA) 100 MG tablet Take 100 mg by mouth as needed.    [name] SPIRIVA HANDIHALER 18 MCG inhalation capsule Place 18 mcg into inhaler and inhale daily as needed.      No current facility-administered medications for this visit.    Allergies:   Review of patient's allergies indicates no known allergies.   Social History:  The patient  reports that he has quit smoking. He has never used smokeless tobacco. He reports that he does not drink alcohol.   Family  History:  The patient's  family history is not on file.    ROS:  Please see the history of present illness.  Otherwise, review of systems is positive for  Hearing loss, cough, shortness of breath with activity, blood in stool, leg pain, constipation, balance problems.  All other systems are reviewed and negative.    PHYSICAL EXAM: VS:  BP 142/78 mmHg  Pulse 44  Ht 5\' 11"  (1.803 m)  Wt 161 lb (73.029 kg)  BMI 22.46 kg/m2 , BMI Body mass index is 22.46 kg/(m^2). GEN: Well nourished, well developed, pleasant elderly male in no acute distress HEENT: normal Neck: no JVD, no masses. Bilateral carotid bruits Cardiac: bradycardiac and regular without murmur or gallop  Distant heart sounds               Respiratory:  clear to auscultation bilaterally, normal work of breathing, prolonged expiration GI: soft, nontender, nondistended, + BS MS: no deformity or atrophy Ext: no pretibial edema Skin: warm and dry, no rash Neuro:  Strength and sensation are intact Psych: euthymic mood, full affect  EKG:  EKG is ordered today. The ekg ordered today shows  Marked sinus bradycardia 44 bpm, otherwise within normal limits.  Recent Labs: No results found for requested labs within last 365 days.   Lipid Panel     Component Value Date/Time   CHOL * 04/15/2009 0320    212        ATP III CLASSIFICATION:  <200     mg/dL   Desirable  200-239  mg/dL   Borderline High  >=240    mg/dL   High          TRIG 79 04/15/2009 0320   HDL 38* 04/15/2009 0320   CHOLHDL 5.6 04/15/2009 0320   VLDL 16 04/15/2009 0320   LDLCALC * 04/15/2009 0320    158        Total Cholesterol/HDL:CHD Risk Coronary Heart Disease Risk Table                     Men   Women  1/2 Average Risk   3.4   3.3  Average Risk       5.0   4.4  2 X Average Risk   9.6   7.1  3 X Average Risk  23.4   11.0        Use the calculated Patient Ratio above and the CHD Risk Table to determine the patient's CHD Risk.        ATP III CLASSIFICATION (LDL):  <100     mg/dL   Optimal  100-129  mg/dL   Near or Above                    Optimal  130-159  mg/dL   Borderline  160-189  mg/dL   High  >190     mg/dL   Very High      Wt Readings from Last 3 Encounters:  03/31/15 161 lb (73.029 kg)  02/18/14 163 lb (73.936 kg)  07/09/13 170 lb 6.4 oz (77.293 kg)     Cardiac Studies Reviewed:  carotid duplex: Pending  ASSESSMENT AND PLAN: 1.   CAD, native vessel, without symptoms of angina: The patient will continue on aspirin, a beta blocker at reduced dose , and ARB, and lipid-lowering therapy with WelChol because of statin intolerance.  2. Bradycardia, asymptomatic: Recommend reducing carvedilol from 12.5 down to 6.25 mg twice daily  3. Essential hypertension: Blood pressure controlled on amlodipine, carvedilol, and losartan  4. Carotid stenosis without history of stroke: last year's duplex scan was reviewed. A repeat duplex was performed today with the results currently pending. Will follow-up once results available. He is on appropriate medical therapy with no signs of stroke or TIA.  Current medicines are reviewed with the patient today.  The patient does not have concerns regarding medicines.  Labs/ tests ordered today include:   Orders Placed This Encounter  Procedures  . EKG 12-Lead    Disposition:   FU one year  Signed, [name], MD  04/02/2015 9:17 PM    [address] [address], [place], [place]  [address] Phone: [phone]; Fax: [phone]

## 2015-03-31 NOTE — Patient Instructions (Signed)
Medication Instructions:  Your physician has recommended you make the following change in your medication:  1. DECREASE Carvedilol to 6.25mg  take one tablet by mouth twice a day  Labwork: No new orders.   Testing/Procedures: No new orders.   Follow-Up: Your physician wants you to follow-up in: 1 YEAR with Dr Burt Knack.  You will receive a reminder letter in the mail two months in advance. If you don't receive a letter, please call our office to schedule the follow-up appointment.   Any Other Special Instructions Will Be Listed Below (If Applicable).     If you need a refill on your cardiac medications before your next appointment, please call your pharmacy.

## 2015-04-02 ENCOUNTER — Encounter: Payer: Self-pay | Admitting: Cardiovascular Disease

## 2015-04-23 ENCOUNTER — Other Ambulatory Visit: Payer: Self-pay | Admitting: Cardiovascular Disease

## 2015-05-31 ENCOUNTER — Other Ambulatory Visit: Payer: Self-pay | Admitting: Cardiovascular Disease

## 2015-07-22 ENCOUNTER — Other Ambulatory Visit: Payer: Self-pay | Admitting: Cardiovascular Disease

## 2015-07-22 MED ORDER — FUROSEMIDE 40 MG PO TABS: ORAL_TABLET | ORAL | Status: DC

## 2015-11-03 DIAGNOSIS — R918 Other nonspecific abnormal finding of lung field: Secondary | ICD-10-CM | POA: Insufficient documentation

## 2015-11-03 DIAGNOSIS — R5382 Chronic fatigue, unspecified: Secondary | ICD-10-CM | POA: Insufficient documentation

## 2015-11-03 DIAGNOSIS — D509 Iron deficiency anemia, unspecified: Secondary | ICD-10-CM | POA: Insufficient documentation

## 2015-11-03 DIAGNOSIS — R195 Other fecal abnormalities: Secondary | ICD-10-CM | POA: Insufficient documentation

## 2015-11-05 DIAGNOSIS — C187 Malignant neoplasm of sigmoid colon: Secondary | ICD-10-CM | POA: Insufficient documentation

## 2015-11-09 ENCOUNTER — Encounter: Payer: Self-pay | Admitting: Cardiovascular Disease

## 2015-11-09 ENCOUNTER — Ambulatory Visit (INDEPENDENT_AMBULATORY_CARE_PROVIDER_SITE_OTHER): Payer: Medicare HMO | Admitting: Cardiovascular Disease

## 2015-11-09 VITALS — BP 150/52 | HR 52 | Ht 71.0 in | Wt 161.0 lb

## 2015-11-09 DIAGNOSIS — I2510 Atherosclerotic heart disease of native coronary artery without angina pectoris: Secondary | ICD-10-CM | POA: Diagnosis not present

## 2015-11-09 DIAGNOSIS — C19 Malignant neoplasm of rectosigmoid junction: Secondary | ICD-10-CM | POA: Insufficient documentation

## 2015-11-09 DIAGNOSIS — I10 Essential (primary) hypertension: Secondary | ICD-10-CM | POA: Diagnosis not present

## 2015-11-09 NOTE — Progress Notes (Signed)
Cardiology Office Note Date:  11/09/2015   ID:  CY BARRISH, DOB Sep 27, 1922, MRN [id number]  PCP:  [name], MD  Cardiologist:  [name], MD    Chief Complaint  Patient presents with  . Coronary Artery Disease    No chest pain,shortness of breath,lee,or claudication  . essential hypertension    History of Present Illness: Sean Hartman is a 82 y.o. male who presents for preoperative cardiac evaluation.  The patient has coronary artery disease and he initially presented with an anterior MI in 2010. He was treated with PCI of the LAD and has done well since that time. He also has carotid stenosis with chronic occlusion of the left internal carotid artery and moderate stenosis of the right internal carotid artery. He has no history of clinical stroke or TIA.  The patient was recently hospitalized with anemia, hemoglobin a proximally 6 mg/dL. He complained of symptoms of shortness of breath, weakness, and lightheadedness. Even with his significant anemia, he had no chest pain or pressure. He was ultimately diagnosed with a colon mass and he is referred now for preoperative evaluation for colon surgery. The patient underwent colonoscopy and blood transfusion. He's feeling much better now. He is here with his son today. He is to have surgery in the near future.  Past Medical History  Diagnosis Date  . CAD (coronary artery disease)     status post anterior wall MI November 2010  . Ischemic cardiomyopathy   . Essential hypertension   . HLD (hyperlipidemia)   . DM2 (diabetes mellitus, type 2) ([place])   . Carotid stenosis     with chronic occlusion of LICA and moderate RICA stenosis  . Anemia     fe deficient    Past Surgical History  Procedure Laterality Date  . Left inguinal hernia repair    . Percutaneous transluminal coronary angioplasty and bare-metal stent to left anterior descending      Current Outpatient Prescriptions  Medication Sig Dispense  Refill  . amLODipine (NORVASC) 5 MG tablet TAKE ONE TABLET BY MOUTH ONE TIME DAILY 90 tablet 1  . aspirin (ASPIR-81) 81 MG EC tablet Take 81 mg by mouth daily.      . carvedilol (COREG) 6.25 MG tablet Take 1 tablet (6.25 mg total) by mouth 2 (two) times daily with a meal. 60 tablet 11  . Dextromethorphan-Guaifenesin (MUCINEX DM PO) Take 10 mLs by mouth every 8 (eight) hours as needed. For cough    . famotidine (PEPCID) 40 MG tablet TAKE ONE TABLET BY MOUTH ONE  TIME DAILY 30 tablet 11  . ferrous sulfate 325 (65 FE) MG tablet Take 1 tablet by mouth every morning.    . furosemide (LASIX) 40 MG tablet TAKE ONE TABLET BY MOUTH TWICE DAILY 60 tablet 10  . gabapentin (NEURONTIN) 100 MG capsule Take 100 mg by mouth daily.    [name] levothyroxine (SYNTHROID, LEVOTHROID) 100 MCG tablet Take 100 mcg by mouth daily.      [name] losartan (COZAAR) 100 MG tablet Take 100 mg by mouth daily.     . nitroGLYCERIN (NITROSTAT) 0.4 MG SL tablet DISSOLVE 1 TABLET UNDER TONGUE EVERY 5 MINUTES AS NEEDED FOR CHEST PAIN UP TO 3 DOSES 25 tablet 6  . sitaGLIPtin (JANUVIA) 100 MG tablet Take 100 mg by mouth as needed (diabetes).     . SPIRIVA HANDIHALER 18 MCG inhalation capsule Place 18 mcg into inhaler and inhale daily as needed (shortness of breath).  No current facility-administered medications for this visit.    Allergies:   Review of patient's allergies indicates no known allergies.   Social History:  The patient  reports that he has quit smoking. He has never used smokeless tobacco. He reports that he does not drink alcohol.   Family History:  The patient's family history is not on file.    ROS:  Please see the history of present illness.  Otherwise, review of systems is positive for Leg swelling, hearing loss, shortness of breath, blood in stool, muscle pain, dizziness, leg pain, constipation.  All other systems are reviewed and negative.    PHYSICAL EXAM: VS:  BP 150/52 mmHg  Pulse 52  Ht 5\' 11"  (1.803 m)  Wt  161 lb (73.029 kg)  BMI 22.46 kg/m2 , BMI Body mass index is 22.46 kg/(m^2). GEN: Well nourished, well developed, pleasant elderly male in no acute distress HEENT: normal Neck: no JVD, no masses. bilateral carotid bruits Cardiac: RRR without murmur or gallop                Respiratory:  clear to auscultation bilaterally, normal work of breathing GI: soft, nontender, nondistended, + BS MS: no deformity or atrophy Ext: trace pretibial edema Skin: warm and dry, no rash Neuro:  Strength and sensation are intact Psych: euthymic mood, full affect  EKG:  EKG is ordered today. The ekg ordered today shows sinus bradycardia 53 bpm, otherwise within normal limits.  Recent Labs: No results found for requested labs within last 365 days.   Lipid Panel     Component Value Date/Time   CHOL * 04/15/2009 0320    212        ATP III CLASSIFICATION:  <200     mg/dL   Desirable  200-239  mg/dL   Borderline High  >=240    mg/dL   High          TRIG 79 04/15/2009 0320   HDL 38* 04/15/2009 0320   CHOLHDL 5.6 04/15/2009 0320   VLDL 16 04/15/2009 0320   LDLCALC * 04/15/2009 0320    158        Total Cholesterol/HDL:CHD Risk Coronary Heart Disease Risk Table                     Men   Women  1/2 Average Risk   3.4   3.3  Average Risk       5.0   4.4  2 X Average Risk   9.6   7.1  3 X Average Risk  23.4   11.0        Use the calculated Patient Ratio above and the CHD Risk Table to determine the patient's CHD Risk.        ATP III CLASSIFICATION (LDL):  <100     mg/dL   Optimal  100-129  mg/dL   Near or Above                    Optimal  130-159  mg/dL   Borderline  160-189  mg/dL   High  >190     mg/dL   Very High      Wt Readings from Last 3 Encounters:  11/09/15 161 lb (73.029 kg)  03/31/15 161 lb (73.029 kg)  02/18/14 163 lb (73.936 kg)    ASSESSMENT AND PLAN: 1.  CAD, native vessel: no angina. He's been stable without recurrent ischemic events since his initial presentation in  2010.  He has no symptoms of heart failure. His cardiac risk of surgery is moderate based on his known carotid and coronary disease as well as his advanced age. However, surgery is necessary to potentially cure localized colon cancer and treat a source of serious GI bleeding. Preoperative cardiac testing is not indicated. He should continue on his current cardiac medical program in the perioperative period.   2. Sinus bradycardia: asymptomatic. Continue observation.  3. Essential HTN: BP reasonably controlled at his advanced age.   4. Carotid stenosis without history of stroke: known chronic occlusion of the LICA. Moderate stenosis of the RICA. No hx of cerebrovascular events.   Current medicines are reviewed with the patient today.  The patient does not have concerns regarding medicines.  Labs/ tests ordered today include:  Orders Placed This Encounter  Procedures  . EKG 12-Lead   Disposition:   FU 6 months  Signed, [name], MD  11/09/2015 4:44 PM    [facility] [address], [place], [place]  [address] Phone: [phone]; Fax: [phone]

## 2015-11-09 NOTE — Patient Instructions (Signed)

## 2015-11-19 DIAGNOSIS — Z9049 Acquired absence of other specified parts of digestive tract: Secondary | ICD-10-CM

## 2015-11-19 HISTORY — DX: Acquired absence of other specified parts of digestive tract: Z90.49

## 2015-12-16 ENCOUNTER — Other Ambulatory Visit: Payer: Self-pay

## 2015-12-16 MED ORDER — AMLODIPINE BESYLATE 5 MG PO TABS: 5.0000 mg | ORAL_TABLET | Freq: Every day | ORAL | Status: DC

## 2016-05-02 ENCOUNTER — Other Ambulatory Visit: Payer: Self-pay

## 2016-05-02 DIAGNOSIS — I10 Essential (primary) hypertension: Secondary | ICD-10-CM

## 2016-05-02 DIAGNOSIS — R001 Bradycardia, unspecified: Secondary | ICD-10-CM

## 2016-05-02 MED ORDER — CARVEDILOL 6.25 MG PO TABS: 6.2500 mg | ORAL_TABLET | Freq: Two times a day (BID) | ORAL | 1 refills | Status: DC

## 2016-05-03 MED ORDER — FAMOTIDINE 40 MG PO TABS: 40.0000 mg | ORAL_TABLET | Freq: Every day | ORAL | 2 refills | Status: DC

## 2016-05-03 MED ORDER — CARVEDILOL 6.25 MG PO TABS: 6.2500 mg | ORAL_TABLET | Freq: Two times a day (BID) | ORAL | 2 refills | Status: DC

## 2016-05-03 NOTE — Addendum Note (Signed)
Addended by: Derl Barrow on: 05/03/2016 09:28 AM   Modules accepted: Orders

## 2016-06-16 ENCOUNTER — Other Ambulatory Visit: Payer: Self-pay | Admitting: *Deleted

## 2016-06-16 MED ORDER — AMLODIPINE BESYLATE 5 MG PO TABS: 5.0000 mg | ORAL_TABLET | Freq: Every day | ORAL | 1 refills | Status: DC

## 2016-06-30 ENCOUNTER — Other Ambulatory Visit: Payer: Self-pay | Admitting: Cardiovascular Disease

## 2016-06-30 DIAGNOSIS — I6523 Occlusion and stenosis of bilateral carotid arteries: Secondary | ICD-10-CM

## 2016-07-03 ENCOUNTER — Encounter: Payer: Self-pay | Admitting: Cardiovascular Disease

## 2016-07-03 ENCOUNTER — Ambulatory Visit (HOSPITAL_COMMUNITY)
Admission: RE | Admit: 2016-07-03 | Discharge: 2016-07-03 | Disposition: A | Discharge status: Home / Self Care | Payer: Medicare HMO | Source: Ambulatory Visit | Attending: Cardiovascular Disease | Admitting: Cardiovascular Disease

## 2016-07-03 ENCOUNTER — Ambulatory Visit (INDEPENDENT_AMBULATORY_CARE_PROVIDER_SITE_OTHER): Payer: Medicare HMO | Admitting: Cardiovascular Disease

## 2016-07-03 VITALS — BP 140/70 | HR 52 | Ht 71.0 in | Wt 162.0 lb

## 2016-07-03 DIAGNOSIS — R001 Bradycardia, unspecified: Secondary | ICD-10-CM

## 2016-07-03 DIAGNOSIS — I2510 Atherosclerotic heart disease of native coronary artery without angina pectoris: Secondary | ICD-10-CM

## 2016-07-03 DIAGNOSIS — I6523 Occlusion and stenosis of bilateral carotid arteries: Secondary | ICD-10-CM

## 2016-07-03 DIAGNOSIS — I10 Essential (primary) hypertension: Secondary | ICD-10-CM

## 2016-07-03 MED ORDER — CARVEDILOL 3.125 MG PO TABS: 3.1250 mg | ORAL_TABLET | Freq: Two times a day (BID) | ORAL | 11 refills | Status: DC

## 2016-07-03 NOTE — Patient Instructions (Signed)
Medication Instructions:  Your physician has recommended you make the following change in your medication:  1. DECREASE Carvedilol to 3.125mg  take one tablet by mouth twice a day  Labwork: No new orders.   Testing/Procedures: No new orders.   Follow-Up: Your physician wants you to follow-up in: 1 YEAR with Dr Burt Knack.  You will receive a reminder letter in the mail two months in advance. If you don't receive a letter, please call our office to schedule the follow-up appointment.   Any Other Special Instructions Will Be Listed Below (If Applicable).     If you need a refill on your cardiac medications before your next appointment, please call your pharmacy.

## 2016-07-03 NOTE — Progress Notes (Signed)
Cardiology Office Note Date:  07/03/2016   ID:  Sean Hartman, DOB Apr 25, 1923, MRN [id number]  PCP:  [name], MD  Cardiologist:  [name], MD    Chief Complaint  Patient presents with  . Follow-up    1 year     History of Present Illness: Sean Hartman is a 83 y.o. male who presents for follow-up evaluation.  The patient has coronary artery disease and he initially presented with an anterior MI in 2010. He was treated with PCI of the LAD and has done well since that time. He also has carotid stenosis with chronic occlusion of the left internal carotid artery and moderate stenosis of the right internal carotid artery. He has no history of clinical stroke or TIA.  He is here with his son today. He's been feeling well. He denies any cardiac related complaints and specifically denies chest pain, chest pressure, lightheadedness, syncope, or heart palpitations. He denies shortness of breath. He continues to get together with his friends at the local [employer] for coffee every morning. His biggest limitation relates to his knees. His knees are weak and it takes him some time to get going when he walks.  Since I saw the patient last year he underwent partial colectomy and remarkably did very well with this without any major complications.  Past Medical History:  Diagnosis Date  . Anemia    fe deficient  . CAD (coronary artery disease)    status post anterior wall MI November 2010  . Carotid stenosis    with chronic occlusion of LICA and moderate RICA stenosis  . DM2 (diabetes mellitus, type 2) ([place])   . Essential hypertension   . HLD (hyperlipidemia)   . Ischemic cardiomyopathy     Past Surgical History:  Procedure Laterality Date  . left inguinal hernia repair    . percutaneous transluminal coronary angioplasty and bare-metal stent to left anterior descending      Current Outpatient Prescriptions  Medication Sig Dispense Refill  . amLODipine  (NORVASC) 5 MG tablet Take 1 tablet (5 mg total) by mouth daily. 90 tablet 1  . aspirin (ASPIR-81) 81 MG EC tablet Take 81 mg by mouth daily.      . carvedilol (COREG) 3.125 MG tablet Take 1 tablet (3.125 mg total) by mouth 2 (two) times daily with a meal. 60 tablet 11  . Dextromethorphan-Guaifenesin (MUCINEX DM PO) Take 10 mLs by mouth every 8 (eight) hours as needed. For cough    . famotidine (PEPCID) 40 MG tablet Take 1 tablet (40 mg total) by mouth daily. 90 tablet 2  . ferrous sulfate 325 (65 FE) MG tablet Take 1 tablet by mouth every morning.    . furosemide (LASIX) 40 MG tablet TAKE ONE TABLET BY MOUTH TWICE DAILY 60 tablet 10  . gabapentin (NEURONTIN) 100 MG capsule Take 100 mg by mouth daily.    [name] levothyroxine (SYNTHROID, LEVOTHROID) 100 MCG tablet Take 100 mcg by mouth daily.      [name] losartan (COZAAR) 100 MG tablet Take 100 mg by mouth daily.     . nitroGLYCERIN (NITROSTAT) 0.4 MG SL tablet DISSOLVE 1 TABLET UNDER TONGUE EVERY 5 MINUTES AS NEEDED FOR CHEST PAIN UP TO 3 DOSES 25 tablet 6  . sitaGLIPtin (JANUVIA) 100 MG tablet Take 100 mg by mouth as needed (diabetes).     . SPIRIVA HANDIHALER 18 MCG inhalation capsule Place 18 mcg into inhaler and inhale daily as needed (shortness of breath).  No current facility-administered medications for this visit.     Allergies:   Patient has no known allergies.   Social History:  The patient  reports that he has quit smoking. He has never used smokeless tobacco. He reports that he does not drink alcohol.   Family History:  The patient's  family history is not on file.    ROS:  Please see the history of present illness.  Otherwise, review of systems is positive for Weight loss, hearing loss, muscle pain, leg pain, wheezing.  All other systems are reviewed and negative.    PHYSICAL EXAM: VS:  BP 140/70   Pulse (!) 52   Ht 5\' 11"  (1.803 m)   Wt 162 lb (73.5 kg)   BMI 22.59 kg/m  , BMI Body mass index is 22.59 kg/m. GEN: Well  nourished, well developed, pleasant elderly male in no acute distress  HEENT: normal  Neck: no JVD, no masses. Bilateral carotid bruits right greater than left Cardiac: Bradycardic and regular without murmur or gallop                Respiratory:  clear to auscultation bilaterally, normal work of breathing GI: soft, nontender, nondistended, + BS MS: no deformity or atrophy  Ext: no pretibial edema Skin: warm and dry, no rash Neuro:  Strength and sensation are intact Psych: euthymic mood, full affect  EKG:  EKG is ordered today. The ekg ordered today shows sinus bradycardia 52 bpm, otherwise within normal limits.  Recent Labs: No results found for requested labs within last 8760 hours.   Lipid Panel     Component Value Date/Time   CHOL (H) 04/15/2009 0320    212        ATP III CLASSIFICATION:  <200     mg/dL   Desirable  200-239  mg/dL   Borderline High  >=240    mg/dL   High          TRIG 79 04/15/2009 0320   HDL 38 (L) 04/15/2009 0320   CHOLHDL 5.6 04/15/2009 0320   VLDL 16 04/15/2009 0320   LDLCALC (H) 04/15/2009 0320    158        Total Cholesterol/HDL:CHD Risk Coronary Heart Disease Risk Table                     Men   Women  1/2 Average Risk   3.4   3.3  Average Risk       5.0   4.4  2 X Average Risk   9.6   7.1  3 X Average Risk  23.4   11.0        Use the calculated Patient Ratio above and the CHD Risk Table to determine the patient's CHD Risk.        ATP III CLASSIFICATION (LDL):  <100     mg/dL   Optimal  100-129  mg/dL   Near or Above                    Optimal  130-159  mg/dL   Borderline  160-189  mg/dL   High  >190     mg/dL   Very High      Wt Readings from Last 3 Encounters:  07/03/16 162 lb (73.5 kg)  11/09/15 161 lb (73 kg)  03/31/15 161 lb (73 kg)     ASSESSMENT AND PLAN: 1.  CAD, native vessel: Patient without symptoms of angina. He is tolerating  aspirin, a beta blocker, and an ARB. He is intolerant to all statin drugs. He will return  for follow-up in one year.  2. Marked sinus bradycardia: Advised him to reduce his carvedilol to 3.125 mg twice daily. He appears to be asymptomatic.  3. Carotid stenosis without history of stroke or TIA: Carotid duplex was done today. We'll touch base with him as soon as the results are available. He continues on antiplatelet therapy. He has known chronic occlusion of the left internal carotid artery and moderate stenosis of the right internal carotid artery.  4. Hypertension: Blood pressure is controlled with amlodipine, carvedilol, and losartan  5. Hyperlipidemia: Statin intolerant. Not interested in alternatives.  Current medicines are reviewed with the patient today.  The patient does not have concerns regarding medicines.  Labs/ tests ordered today include:   Orders Placed This Encounter  Procedures  . EKG 12-Lead    Disposition:   FU One year  Signed, [name], MD  07/03/2016 1:42 PM    [facility] [address], [place], [place]  [address] Phone: [phone]; Fax: [phone]

## 2016-07-12 DIAGNOSIS — E1140 Type 2 diabetes mellitus with diabetic neuropathy, unspecified: Secondary | ICD-10-CM | POA: Insufficient documentation

## 2016-07-12 DIAGNOSIS — K219 Gastro-esophageal reflux disease without esophagitis: Secondary | ICD-10-CM | POA: Insufficient documentation

## 2016-07-12 DIAGNOSIS — E1149 Type 2 diabetes mellitus with other diabetic neurological complication: Secondary | ICD-10-CM | POA: Insufficient documentation

## 2016-09-11 ENCOUNTER — Other Ambulatory Visit: Payer: Self-pay | Admitting: Cardiovascular Disease

## 2016-09-25 ENCOUNTER — Other Ambulatory Visit: Payer: Self-pay | Admitting: Cardiovascular Disease

## 2016-10-11 ENCOUNTER — Encounter (INDEPENDENT_AMBULATORY_CARE_PROVIDER_SITE_OTHER): Payer: Medicare HMO | Admitting: Ophthalmology

## 2016-10-11 DIAGNOSIS — I10 Essential (primary) hypertension: Secondary | ICD-10-CM | POA: Diagnosis not present

## 2016-10-11 DIAGNOSIS — H2512 Age-related nuclear cataract, left eye: Secondary | ICD-10-CM | POA: Diagnosis not present

## 2016-10-11 DIAGNOSIS — E11311 Type 2 diabetes mellitus with unspecified diabetic retinopathy with macular edema: Secondary | ICD-10-CM

## 2016-10-11 DIAGNOSIS — H35033 Hypertensive retinopathy, bilateral: Secondary | ICD-10-CM | POA: Diagnosis not present

## 2016-10-11 DIAGNOSIS — H43813 Vitreous degeneration, bilateral: Secondary | ICD-10-CM

## 2016-10-11 DIAGNOSIS — H353122 Nonexudative age-related macular degeneration, left eye, intermediate dry stage: Secondary | ICD-10-CM | POA: Diagnosis not present

## 2016-10-11 DIAGNOSIS — H353111 Nonexudative age-related macular degeneration, right eye, early dry stage: Secondary | ICD-10-CM | POA: Diagnosis not present

## 2016-10-11 DIAGNOSIS — E113312 Type 2 diabetes mellitus with moderate nonproliferative diabetic retinopathy with macular edema, left eye: Secondary | ICD-10-CM | POA: Diagnosis not present

## 2016-10-31 ENCOUNTER — Other Ambulatory Visit: Payer: Self-pay | Admitting: Cardiovascular Disease

## 2016-11-01 ENCOUNTER — Encounter (INDEPENDENT_AMBULATORY_CARE_PROVIDER_SITE_OTHER): Payer: Self-pay | Admitting: Ophthalmology

## 2017-02-12 ENCOUNTER — Ambulatory Visit (INDEPENDENT_AMBULATORY_CARE_PROVIDER_SITE_OTHER): Payer: Medicare HMO | Admitting: Ophthalmology

## 2017-02-12 DIAGNOSIS — H2512 Age-related nuclear cataract, left eye: Secondary | ICD-10-CM | POA: Diagnosis not present

## 2017-02-12 DIAGNOSIS — H353111 Nonexudative age-related macular degeneration, right eye, early dry stage: Secondary | ICD-10-CM

## 2017-02-12 DIAGNOSIS — I10 Essential (primary) hypertension: Secondary | ICD-10-CM

## 2017-02-12 DIAGNOSIS — H43813 Vitreous degeneration, bilateral: Secondary | ICD-10-CM

## 2017-02-12 DIAGNOSIS — H35033 Hypertensive retinopathy, bilateral: Secondary | ICD-10-CM

## 2017-02-12 DIAGNOSIS — E11319 Type 2 diabetes mellitus with unspecified diabetic retinopathy without macular edema: Secondary | ICD-10-CM | POA: Diagnosis not present

## 2017-02-12 DIAGNOSIS — E113292 Type 2 diabetes mellitus with mild nonproliferative diabetic retinopathy without macular edema, left eye: Secondary | ICD-10-CM | POA: Diagnosis not present

## 2017-02-12 DIAGNOSIS — H353122 Nonexudative age-related macular degeneration, left eye, intermediate dry stage: Secondary | ICD-10-CM

## 2017-03-15 ENCOUNTER — Encounter (INDEPENDENT_AMBULATORY_CARE_PROVIDER_SITE_OTHER): Payer: Medicare HMO | Admitting: Ophthalmology

## 2017-03-16 ENCOUNTER — Encounter (INDEPENDENT_AMBULATORY_CARE_PROVIDER_SITE_OTHER): Payer: Medicare HMO | Admitting: Ophthalmology

## 2017-03-16 DIAGNOSIS — I10 Essential (primary) hypertension: Secondary | ICD-10-CM | POA: Diagnosis not present

## 2017-03-16 DIAGNOSIS — H4313 Vitreous hemorrhage, bilateral: Secondary | ICD-10-CM | POA: Diagnosis not present

## 2017-03-16 DIAGNOSIS — E11319 Type 2 diabetes mellitus with unspecified diabetic retinopathy without macular edema: Secondary | ICD-10-CM

## 2017-03-16 DIAGNOSIS — H59022 Cataract (lens) fragments in eye following cataract surgery, left eye: Secondary | ICD-10-CM | POA: Diagnosis not present

## 2017-03-16 DIAGNOSIS — H35033 Hypertensive retinopathy, bilateral: Secondary | ICD-10-CM

## 2017-03-16 DIAGNOSIS — H353132 Nonexudative age-related macular degeneration, bilateral, intermediate dry stage: Secondary | ICD-10-CM

## 2017-03-16 NOTE — H&P (Signed)
Sean Hartman is an 83 y.o. male.   Chief Complaint:large floaters left eye after cataract surgery  HPI: Had cataract surgery 3 days ago.  Has retained lens material left eye  Past Medical History:  Diagnosis Date  . Anemia    fe deficient  . CAD (coronary artery disease)    status post anterior wall MI November 2010  . Carotid stenosis    with chronic occlusion of LICA and moderate RICA stenosis  . DM2 (diabetes mellitus, type 2) ([place])   . Essential hypertension   . HLD (hyperlipidemia)   . Ischemic cardiomyopathy     Past Surgical History:  Procedure Laterality Date  . left inguinal hernia repair    . percutaneous transluminal coronary angioplasty and bare-metal stent to left anterior descending      No family history on file. Social History:  reports that he has quit smoking. He has never used smokeless tobacco. He reports that he does not drink alcohol. His drug history is not on file.  Allergies: No Known Allergies  No prescriptions prior to admission.    Review of systems otherwise negative  There were no vitals taken for this visit.  Physical exam: Mental status: oriented x3. Eyes: See eye exam associated with this date of surgery in media tab.  Scanned in by scanning center Ears, Nose, Throat: within normal limits Neck: Within Normal limits General: within normal limits Chest: Within normal limits Breast: deferred Heart: Within normal limits Abdomen: Within normal limits GU: deferred Extremities: within normal limits Skin: within normal limits  Assessment/Plan Retained lens material left eye Plan: To [facility] for Pars plana vitrectomy, removal of lens material, laser, possible removal of intraocular lens and placement of intraocular lens with suture left eye  MATTHEWS, JOHN D 03/16/2017, 10:42 AM

## 2017-03-17 ENCOUNTER — Encounter (HOSPITAL_COMMUNITY): Payer: Self-pay | Admitting: *Deleted

## 2017-03-17 NOTE — Progress Notes (Signed)
Denies chest pain/ shortness of breath. All preop instructions per Dr. Zigmund Daniel.

## 2017-03-19 NOTE — Progress Notes (Signed)
Anesthesia Chart Review:  Pt is a same day work up.   Pt is a 83 year old male scheduled for L pars plana vitrectomy for retained lens material (from recent cataract surgery) on 03/20/2017 with Tempie Hoist, MD  - PCP is Dione Housekeeper, MD (notes in care everywhere)  - Cardiologist is [name], MD. Last office visit 07/03/16, f/u in 1 year recommended.   PMH includes:  CAD (PCI/BMS to proximal LAD 2010), ischemic cardiomyopathy (recovered by 2012 echo), HTN, DM, hyperlipidemia, carotid stenosis (chronic occlusion LICA), anemia. Former smoker. S/p partial colectomy.   Medications include: Amlodipine, ASA 81 mg, carvedilol, Pepcid, iron, Lasix, levothyroxine, losartan, sitagliptin, Spiriva  Labs will be obtained DOS  EKG 07/03/16: sinus bradycardia 52 bpm  Carotid duplex 07/03/16:  - Heterogeneous plaque, bilaterally. - 60-79% RICA stenosis  - Chronically occluded LICA with trickle flow proximally and reconstitution of flow distally. - Normal subclavian arteries, bilaterally. - Patent vertebral arteries with aberrant flow in the right and antegrade flow in the left.  Echo 01/05/11:  - Left ventricle: The cavity size was normal. There was mild focalbasal hypertrophy of the septum. Systolic function was normal. Theestimated ejection fraction was in the range of 55% to 60%. Wallmotion was normal; there were no regional wall motion abnormalities. Doppler parameters are consistent with abnormal left ventricular relaxation (grade 1 diastolic dysfunction).    Cardiac cath 04/14/09:  1. Acute anterolateral MI secondary to proximal LAD occlusion 2. Moderately severe LCx involving OM1 (80%) and OM2 (75%) 3. Severe RCA stenosis (subtotally occluded at midportion; bridging collaterals noted). RCA is nondominant.  4. Severe LV dysfunction with an LV EF 20-25% consistent with large anterolateral and anteroapical MI.  5. Successful PCI proximal LAD with BMS.   If labs acceptable DOS, I  anticipate pt can proceed as scheduled.   Willeen Cass, FNP-BC [facility]/Anesthesiology Phone: [phone] 03/19/2017 1:34 PM

## 2017-03-20 ENCOUNTER — Ambulatory Visit (HOSPITAL_COMMUNITY): Payer: Medicare HMO | Admitting: Certified Registered Nurse Anesthetist

## 2017-03-20 ENCOUNTER — Ambulatory Visit (HOSPITAL_COMMUNITY)
Admission: RE | Admit: 2017-03-20 | Discharge: 2017-03-21 | Disposition: A | Discharge status: Home / Self Care | Payer: Medicare HMO | Source: Ambulatory Visit | Attending: Ophthalmology | Admitting: Ophthalmology

## 2017-03-20 ENCOUNTER — Encounter (HOSPITAL_COMMUNITY): Payer: Self-pay

## 2017-03-20 ENCOUNTER — Encounter (HOSPITAL_COMMUNITY)
Admission: RE | Disposition: A | Discharge status: Home / Self Care | Payer: Self-pay | Source: Ambulatory Visit | Attending: Ophthalmology

## 2017-03-20 DIAGNOSIS — I252 Old myocardial infarction: Secondary | ICD-10-CM | POA: Insufficient documentation

## 2017-03-20 DIAGNOSIS — Z7984 Long term (current) use of oral hypoglycemic drugs: Secondary | ICD-10-CM | POA: Diagnosis not present

## 2017-03-20 DIAGNOSIS — Z9842 Cataract extraction status, left eye: Secondary | ICD-10-CM | POA: Insufficient documentation

## 2017-03-20 DIAGNOSIS — E785 Hyperlipidemia, unspecified: Secondary | ICD-10-CM | POA: Insufficient documentation

## 2017-03-20 DIAGNOSIS — Z87891 Personal history of nicotine dependence: Secondary | ICD-10-CM | POA: Insufficient documentation

## 2017-03-20 DIAGNOSIS — H59022 Cataract (lens) fragments in eye following cataract surgery, left eye: Secondary | ICD-10-CM | POA: Diagnosis present

## 2017-03-20 DIAGNOSIS — Z79899 Other long term (current) drug therapy: Secondary | ICD-10-CM | POA: Insufficient documentation

## 2017-03-20 DIAGNOSIS — I10 Essential (primary) hypertension: Secondary | ICD-10-CM | POA: Diagnosis not present

## 2017-03-20 DIAGNOSIS — I2510 Atherosclerotic heart disease of native coronary artery without angina pectoris: Secondary | ICD-10-CM | POA: Insufficient documentation

## 2017-03-20 DIAGNOSIS — E119 Type 2 diabetes mellitus without complications: Secondary | ICD-10-CM | POA: Insufficient documentation

## 2017-03-20 HISTORY — DX: Malignant neoplasm of colon, unspecified: C18.9

## 2017-03-20 HISTORY — PX: 25 GAUGE PARS PLANA VITRECTOMY WITH 20 GAUGE MVR PORT: SHX6041

## 2017-03-20 LAB — CBC
HCT: 40.9 % (ref 39.0–52.0)
HEMOGLOBIN: 13.6 g/dL (ref 13.0–17.0)
MCH: 30.4 pg (ref 26.0–34.0)
MCHC: 33.3 g/dL (ref 30.0–36.0)
MCV: 91.3 fL (ref 78.0–100.0)
PLATELETS: 191 10*3/uL (ref 150–400)
RBC: 4.48 MIL/uL (ref 4.22–5.81)
RDW: 13.8 % (ref 11.5–15.5)
WBC: 6.1 10*3/uL (ref 4.0–10.5)

## 2017-03-20 LAB — BASIC METABOLIC PANEL
ANION GAP: 8 (ref 5–15)
BUN: 16 mg/dL (ref 6–20)
CALCIUM: 9.4 mg/dL (ref 8.9–10.3)
CHLORIDE: 106 mmol/L (ref 101–111)
CO2: 23 mmol/L (ref 22–32)
CREATININE: 0.98 mg/dL (ref 0.61–1.24)
GFR calc non Af Amer: >60 mL/min (ref >60)
Glucose, Bld: 164 mg/dL — ABNORMAL HIGH (ref 65–99)
Potassium: 3.6 mmol/L (ref 3.5–5.1)
SODIUM: 137 mmol/L (ref 135–145)

## 2017-03-20 LAB — GLUCOSE, CAPILLARY
GLUCOSE-CAPILLARY: 156 mg/dL — AB (ref 65–99)
Glucose-Capillary: 130 mg/dL — ABNORMAL HIGH (ref 65–99)

## 2017-03-20 SURGERY — 25 GAUGE PARS PLANA VITRECTOMY WITH 20 GAUGE MVR PORT
Anesthesia: General | Site: Eye | Laterality: Left

## 2017-03-20 MED ORDER — TETRACAINE HCL 0.5 % OP SOLN
2.0000 [drp] | Freq: Once | OPHTHALMIC | Status: DC
  Filled 2017-03-20: qty 4

## 2017-03-20 MED ORDER — HYPROMELLOSE (GONIOSCOPIC) 2.5 % OP SOLN
OPHTHALMIC | Status: AC
Start: 2017-03-20 — End: 2017-03-20
  Filled 2017-03-20: qty 15

## 2017-03-20 MED ORDER — BACITRACIN-POLYMYXIN B 500-10000 UNIT/GM OP OINT
TOPICAL_OINTMENT | OPHTHALMIC | Status: DC | PRN
  Administered 2017-03-20: 1 via OPHTHALMIC

## 2017-03-20 MED ORDER — LATANOPROST 0.005 % OP SOLN
1.0000 [drp] | Freq: Every day | OPHTHALMIC | Status: DC
  Filled 2017-03-20: qty 2.5

## 2017-03-20 MED ORDER — ONDANSETRON HCL 4 MG/2ML IJ SOLN
4.0000 mg | Freq: Four times a day (QID) | INTRAMUSCULAR | Status: DC
  Administered 2017-03-20 – 2017-03-21 (×3): 4 mg via INTRAVENOUS
  Filled 2017-03-20 (×3): qty 2

## 2017-03-20 MED ORDER — BRIMONIDINE TARTRATE 0.2 % OP SOLN
1.0000 [drp] | Freq: Two times a day (BID) | OPHTHALMIC | Status: DC
  Filled 2017-03-20: qty 5

## 2017-03-20 MED ORDER — GATIFLOXACIN 0.5 % OP SOLN
1.0000 [drp] | Freq: Four times a day (QID) | OPHTHALMIC | Status: DC
  Filled 2017-03-20: qty 2.5

## 2017-03-20 MED ORDER — SODIUM CHLORIDE 0.45 % IV SOLN
INTRAVENOUS | Status: DC
  Administered 2017-03-20: 16:00:00 via INTRAVENOUS

## 2017-03-20 MED ORDER — POLYMYXIN B SULFATE 500000 UNITS IJ SOLR
INTRAMUSCULAR | Status: AC
  Filled 2017-03-20: qty 500000

## 2017-03-20 MED ORDER — CYCLOPENTOLATE HCL 1 % OP SOLN
1.0000 [drp] | OPHTHALMIC | Status: AC | PRN
  Administered 2017-03-20: 1 [drp] via OPHTHALMIC

## 2017-03-20 MED ORDER — MAGNESIUM HYDROXIDE 400 MG/5ML PO SUSP: 15.0000 mL | Freq: Four times a day (QID) | ORAL | Status: DC | PRN

## 2017-03-20 MED ORDER — EPHEDRINE SULFATE-NACL 50-0.9 MG/10ML-% IV SOSY
PREFILLED_SYRINGE | INTRAVENOUS | Status: DC | PRN
  Administered 2017-03-20: 5 mg via INTRAVENOUS
  Administered 2017-03-20: 10 mg via INTRAVENOUS
  Administered 2017-03-20: 15 mg via INTRAVENOUS

## 2017-03-20 MED ORDER — LINAGLIPTIN 5 MG PO TABS: 5.0000 mg | ORAL_TABLET | Freq: Every day | ORAL | Status: DC

## 2017-03-20 MED ORDER — GLYCOPYRROLATE 0.2 MG/ML IJ SOLN
INTRAMUSCULAR | Status: DC | PRN
  Administered 2017-03-20: 0.2 mg via INTRAVENOUS

## 2017-03-20 MED ORDER — SODIUM CHLORIDE 0.9 % IJ SOLN
INTRAMUSCULAR | Status: AC
Start: 2017-03-20 — End: 2017-03-20
  Filled 2017-03-20: qty 10

## 2017-03-20 MED ORDER — BSS IO SOLN
INTRAOCULAR | Status: AC
  Filled 2017-03-20: qty 15

## 2017-03-20 MED ORDER — DEXAMETHASONE SODIUM PHOSPHATE 10 MG/ML IJ SOLN
INTRAMUSCULAR | Status: AC
  Filled 2017-03-20: qty 1

## 2017-03-20 MED ORDER — BUPIVACAINE HCL (PF) 0.75 % IJ SOLN
INTRAMUSCULAR | Status: DC | PRN
  Administered 2017-03-20: 10 mL

## 2017-03-20 MED ORDER — MORPHINE SULFATE (PF) 4 MG/ML IV SOLN: 1.0000 mg | INTRAVENOUS | Status: DC | PRN

## 2017-03-20 MED ORDER — STERILE WATER FOR INJECTION IJ SOLN
INTRAMUSCULAR | Status: AC
  Filled 2017-03-20: qty 20

## 2017-03-20 MED ORDER — SUGAMMADEX SODIUM 200 MG/2ML IV SOLN
INTRAVENOUS | Status: DC | PRN
  Administered 2017-03-20: 200 mg via INTRAVENOUS

## 2017-03-20 MED ORDER — GATIFLOXACIN 0.5 % OP SOLN
1.0000 [drp] | OPHTHALMIC | Status: AC | PRN
  Administered 2017-03-20 (×2): 1 [drp] via OPHTHALMIC
  Filled 2017-03-20: qty 2.5

## 2017-03-20 MED ORDER — CARVEDILOL 3.125 MG PO TABS
3.1250 mg | ORAL_TABLET | Freq: Two times a day (BID) | ORAL | Status: DC
  Administered 2017-03-20 – 2017-03-21 (×2): 3.125 mg via ORAL
  Filled 2017-03-20 (×2): qty 1

## 2017-03-20 MED ORDER — PROPOFOL 10 MG/ML IV BOLUS
INTRAVENOUS | Status: DC | PRN
  Administered 2017-03-20: 100 mg via INTRAVENOUS

## 2017-03-20 MED ORDER — PREDNISOLONE ACETATE 1 % OP SUSP
1.0000 [drp] | Freq: Four times a day (QID) | OPHTHALMIC | Status: DC
  Filled 2017-03-20: qty 5

## 2017-03-20 MED ORDER — ASPIRIN 81 MG PO CHEW
81.0000 mg | CHEWABLE_TABLET | Freq: Every day | ORAL | Status: DC
  Administered 2017-03-20: 81 mg via ORAL
  Filled 2017-03-20: qty 1

## 2017-03-20 MED ORDER — CEFTAZIDIME 1 G IJ SOLR
INTRAMUSCULAR | Status: AC
  Filled 2017-03-20: qty 1

## 2017-03-20 MED ORDER — EPINEPHRINE PF 1 MG/ML IJ SOLN
INTRAMUSCULAR | Status: AC
  Filled 2017-03-20: qty 1

## 2017-03-20 MED ORDER — ACETAZOLAMIDE SODIUM 500 MG IJ SOLR
INTRAMUSCULAR | Status: AC
  Filled 2017-03-20: qty 500

## 2017-03-20 MED ORDER — LOSARTAN POTASSIUM 50 MG PO TABS
100.0000 mg | ORAL_TABLET | Freq: Every day | ORAL | Status: DC
  Administered 2017-03-20: 100 mg via ORAL
  Filled 2017-03-20: qty 2

## 2017-03-20 MED ORDER — DEXAMETHASONE SODIUM PHOSPHATE 10 MG/ML IJ SOLN
INTRAMUSCULAR | Status: DC | PRN
  Administered 2017-03-20: 10 mg

## 2017-03-20 MED ORDER — ONDANSETRON HCL 4 MG/2ML IJ SOLN
INTRAMUSCULAR | Status: DC | PRN
  Administered 2017-03-20: 4 mg via INTRAVENOUS

## 2017-03-20 MED ORDER — SODIUM CHLORIDE 0.9 % IV SOLN
INTRAVENOUS | Status: DC
  Administered 2017-03-20: 10:00:00 via INTRAVENOUS

## 2017-03-20 MED ORDER — LIDOCAINE 2% (20 MG/ML) 5 ML SYRINGE
INTRAMUSCULAR | Status: DC | PRN
  Administered 2017-03-20: 40 mg via INTRAVENOUS

## 2017-03-20 MED ORDER — GABAPENTIN 100 MG PO CAPS
100.0000 mg | ORAL_CAPSULE | Freq: Every day | ORAL | Status: DC
  Administered 2017-03-20: 100 mg via ORAL
  Filled 2017-03-20: qty 1

## 2017-03-20 MED ORDER — ACETAZOLAMIDE SODIUM 500 MG IJ SOLR
500.0000 mg | Freq: Once | INTRAMUSCULAR | Status: AC
  Administered 2017-03-21: 500 mg via INTRAVENOUS
  Filled 2017-03-20: qty 500

## 2017-03-20 MED ORDER — PHENYLEPHRINE HCL 2.5 % OP SOLN
1.0000 [drp] | OPHTHALMIC | Status: AC | PRN
  Administered 2017-03-20 (×2): 1 [drp] via OPHTHALMIC
  Filled 2017-03-20: qty 2

## 2017-03-20 MED ORDER — HYALURONIDASE HUMAN 150 UNIT/ML IJ SOLN
INTRAMUSCULAR | Status: AC
  Filled 2017-03-20: qty 1

## 2017-03-20 MED ORDER — CEFAZOLIN SODIUM-DEXTROSE 2-4 GM/100ML-% IV SOLN
2.0000 g | INTRAVENOUS | Status: AC
  Administered 2017-03-20: 2 g via INTRAVENOUS
  Filled 2017-03-20: qty 100

## 2017-03-20 MED ORDER — SUGAMMADEX SODIUM 200 MG/2ML IV SOLN
INTRAVENOUS | Status: AC
  Filled 2017-03-20: qty 2

## 2017-03-20 MED ORDER — BRIMONIDINE TARTRATE 0.2 % OP SOLN: 2.0000 [drp] | OPHTHALMIC | Status: DC

## 2017-03-20 MED ORDER — ROCURONIUM BROMIDE 10 MG/ML (PF) SYRINGE
PREFILLED_SYRINGE | INTRAVENOUS | Status: DC | PRN
Start: 2017-03-20 — End: 2017-03-20
  Administered 2017-03-20: 5 mg via INTRAVENOUS
  Administered 2017-03-20: 40 mg via INTRAVENOUS

## 2017-03-20 MED ORDER — PHENYLEPHRINE HCL 2.5 % OP SOLN
1.0000 [drp] | OPHTHALMIC | Status: AC | PRN
  Administered 2017-03-20: 1 [drp] via OPHTHALMIC

## 2017-03-20 MED ORDER — NITROGLYCERIN 0.4 MG SL SUBL: 0.4000 mg | SUBLINGUAL_TABLET | SUBLINGUAL | Status: DC | PRN

## 2017-03-20 MED ORDER — FERROUS SULFATE 325 (65 FE) MG PO TABS
325.0000 mg | ORAL_TABLET | ORAL | Status: DC
  Administered 2017-03-21: 325 mg via ORAL
  Filled 2017-03-20: qty 1

## 2017-03-20 MED ORDER — BRIMONIDINE TARTRATE 0.15 % OP SOLN
1.0000 [drp] | Freq: Two times a day (BID) | OPHTHALMIC | Status: DC
  Administered 2017-03-20: 1 [drp] via OPHTHALMIC
  Filled 2017-03-20: qty 5

## 2017-03-20 MED ORDER — BACITRACIN-POLYMYXIN B 500-10000 UNIT/GM OP OINT
TOPICAL_OINTMENT | OPHTHALMIC | Status: AC
  Filled 2017-03-20: qty 3.5

## 2017-03-20 MED ORDER — TROPICAMIDE 1 % OP SOLN
1.0000 [drp] | OPHTHALMIC | Status: AC | PRN
  Administered 2017-03-20 (×2): 1 [drp] via OPHTHALMIC
  Filled 2017-03-20: qty 15

## 2017-03-20 MED ORDER — TRIAMCINOLONE ACETONIDE 40 MG/ML IJ SUSP
INTRAMUSCULAR | Status: AC
  Filled 2017-03-20: qty 5

## 2017-03-20 MED ORDER — FUROSEMIDE 40 MG PO TABS
40.0000 mg | ORAL_TABLET | Freq: Two times a day (BID) | ORAL | Status: DC
  Administered 2017-03-21: 40 mg via ORAL
  Filled 2017-03-20 (×2): qty 1

## 2017-03-20 MED ORDER — EPINEPHRINE PF 1 MG/ML IJ SOLN
INTRAMUSCULAR | Status: DC | PRN
  Administered 2017-03-20: 1 mg

## 2017-03-20 MED ORDER — BUPIVACAINE HCL (PF) 0.75 % IJ SOLN
INTRAMUSCULAR | Status: AC
  Filled 2017-03-20: qty 10

## 2017-03-20 MED ORDER — PROPOFOL 10 MG/ML IV BOLUS
INTRAVENOUS | Status: AC
  Filled 2017-03-20: qty 20

## 2017-03-20 MED ORDER — ONDANSETRON HCL 4 MG/2ML IJ SOLN: 4.0000 mg | Freq: Once | INTRAMUSCULAR | Status: DC | PRN

## 2017-03-20 MED ORDER — BSS PLUS IO SOLN
INTRAOCULAR | Status: DC | PRN
  Administered 2017-03-20: 1 via INTRAOCULAR

## 2017-03-20 MED ORDER — HEMOSTATIC AGENTS (NO CHARGE) OPTIME
TOPICAL | Status: DC | PRN
  Administered 2017-03-20: 1 via TOPICAL

## 2017-03-20 MED ORDER — BSS IO SOLN
INTRAOCULAR | Status: DC | PRN
  Administered 2017-03-20: 15 mL via INTRAOCULAR

## 2017-03-20 MED ORDER — STERILE WATER FOR INJECTION IJ SOLN
INTRAMUSCULAR | Status: DC | PRN
  Administered 2017-03-20: 20 mL

## 2017-03-20 MED ORDER — TIOTROPIUM BROMIDE MONOHYDRATE 18 MCG IN CAPS
18.0000 ug | ORAL_CAPSULE | Freq: Every day | RESPIRATORY_TRACT | Status: DC | PRN
  Filled 2017-03-20: qty 5

## 2017-03-20 MED ORDER — ACETAMINOPHEN 325 MG PO TABS
325.0000 mg | ORAL_TABLET | ORAL | Status: DC | PRN
Start: 2017-03-20 — End: 2017-03-21

## 2017-03-20 MED ORDER — LEVOTHYROXINE SODIUM 100 MCG PO TABS
100.0000 ug | ORAL_TABLET | Freq: Every day | ORAL | Status: DC
  Administered 2017-03-21: 100 ug via ORAL
  Filled 2017-03-20: qty 1

## 2017-03-20 MED ORDER — SODIUM HYALURONATE 10 MG/ML IO SOLN
INTRAOCULAR | Status: DC | PRN
  Administered 2017-03-20: 0.85 mL via INTRAOCULAR

## 2017-03-20 MED ORDER — SODIUM HYALURONATE 10 MG/ML IO SOLN
INTRAOCULAR | Status: AC
  Filled 2017-03-20: qty 0.85

## 2017-03-20 MED ORDER — BACITRACIN-POLYMYXIN B 500-10000 UNIT/GM OP OINT
1.0000 "application " | TOPICAL_OINTMENT | Freq: Three times a day (TID) | OPHTHALMIC | Status: DC
  Filled 2017-03-20: qty 3.5

## 2017-03-20 MED ORDER — AMLODIPINE BESYLATE 5 MG PO TABS
5.0000 mg | ORAL_TABLET | Freq: Every day | ORAL | Status: DC
  Administered 2017-03-20: 5 mg via ORAL
  Filled 2017-03-20: qty 1

## 2017-03-20 MED ORDER — ATROPINE SULFATE 1 % OP SOLN
OPHTHALMIC | Status: AC
  Filled 2017-03-20: qty 5

## 2017-03-20 MED ORDER — BUPIVACAINE-EPINEPHRINE (PF) 0.25% -1:200000 IJ SOLN
INTRAMUSCULAR | Status: AC
  Filled 2017-03-20: qty 30

## 2017-03-20 MED ORDER — DORZOLAMIDE HCL 2 % OP SOLN
1.0000 [drp] | Freq: Three times a day (TID) | OPHTHALMIC | Status: DC
  Filled 2017-03-20: qty 10

## 2017-03-20 MED ORDER — BSS PLUS IO SOLN
INTRAOCULAR | Status: AC
  Filled 2017-03-20: qty 500

## 2017-03-20 MED ORDER — FENTANYL CITRATE (PF) 100 MCG/2ML IJ SOLN
INTRAMUSCULAR | Status: DC | PRN
  Administered 2017-03-20: 50 ug via INTRAVENOUS
  Administered 2017-03-20: 25 ug via INTRAVENOUS

## 2017-03-20 MED ORDER — TEMAZEPAM 15 MG PO CAPS
15.0000 mg | ORAL_CAPSULE | Freq: Every evening | ORAL | Status: DC | PRN
  Administered 2017-03-21: 15 mg via ORAL
  Filled 2017-03-20: qty 1

## 2017-03-20 MED ORDER — LIDOCAINE 2% (20 MG/ML) 5 ML SYRINGE
INTRAMUSCULAR | Status: AC
  Filled 2017-03-20: qty 5

## 2017-03-20 MED ORDER — HYDROCODONE-ACETAMINOPHEN 5-325 MG PO TABS: 1.0000 | ORAL_TABLET | ORAL | Status: DC | PRN

## 2017-03-20 MED ORDER — FENTANYL CITRATE (PF) 250 MCG/5ML IJ SOLN
INTRAMUSCULAR | Status: AC
  Filled 2017-03-20: qty 5

## 2017-03-20 MED ORDER — TROPICAMIDE 1 % OP SOLN
1.0000 [drp] | OPHTHALMIC | Status: AC | PRN
  Administered 2017-03-20: 1 [drp] via OPHTHALMIC

## 2017-03-20 MED ORDER — GATIFLOXACIN 0.5 % OP SOLN
1.0000 [drp] | OPHTHALMIC | Status: AC | PRN
  Administered 2017-03-20: 1 [drp] via OPHTHALMIC

## 2017-03-20 MED ORDER — LIDOCAINE HCL 2 % IJ SOLN
INTRAMUSCULAR | Status: AC
  Filled 2017-03-20: qty 20

## 2017-03-20 MED ORDER — PHENYLEPHRINE 40 MCG/ML (10ML) SYRINGE FOR IV PUSH (FOR BLOOD PRESSURE SUPPORT)
PREFILLED_SYRINGE | INTRAVENOUS | Status: DC | PRN
  Administered 2017-03-20: 80 ug via INTRAVENOUS
  Administered 2017-03-20: 40 ug via INTRAVENOUS

## 2017-03-20 MED ORDER — CYCLOPENTOLATE HCL 1 % OP SOLN
1.0000 [drp] | OPHTHALMIC | Status: AC | PRN
  Administered 2017-03-20 (×2): 1 [drp] via OPHTHALMIC
  Filled 2017-03-20: qty 2

## 2017-03-20 MED ORDER — FAMOTIDINE 20 MG PO TABS
40.0000 mg | ORAL_TABLET | Freq: Every day | ORAL | Status: DC
  Administered 2017-03-20: 40 mg via ORAL
  Filled 2017-03-20: qty 2

## 2017-03-20 SURGICAL SUPPLY — 77 items
APL SRG 3 HI ABS STRL LF PLS (MISCELLANEOUS)
APPLICATOR DR MATTHEWS STRL (MISCELLANEOUS) IMPLANT
BALL CTTN LRG ABS STRL LF (GAUZE/BANDAGES/DRESSINGS) ×3
BLADE EYE CATARACT 19 1.4 BEAV (BLADE) IMPLANT
BLADE MVR KNIFE 19G (BLADE) IMPLANT
BLADE MVR KNIFE 20G (BLADE) IMPLANT
CANNULA DUAL BORE 23G (CANNULA) IMPLANT
CANNULA VLV SOFT TIP 25GA (OPHTHALMIC) ×2 IMPLANT
CORDS BIPOLAR (ELECTRODE) ×2 IMPLANT
COTTONBALL LRG STERILE PKG (GAUZE/BANDAGES/DRESSINGS) ×6 IMPLANT
COVER MAYO STAND STRL (DRAPES) ×2 IMPLANT
DRAPE INCISE 51X51 W/FILM STRL (DRAPES) IMPLANT
DRAPE OPHTHALMIC 77X100 STRL (CUSTOM PROCEDURE TRAY) ×2 IMPLANT
FILTER BLUE MILLIPORE (MISCELLANEOUS) IMPLANT
FILTER STRAW FLUID ASPIR (MISCELLANEOUS) IMPLANT
FORCEPS ECKARDT ILM 25G SERR (OPHTHALMIC RELATED) IMPLANT
FORCEPS GRIESHABER ILM 25G A (INSTRUMENTS) IMPLANT
FORCEPS ILM 25G DSP TIP (MISCELLANEOUS) IMPLANT
GLOVE BIOGEL PI IND STRL 8 (GLOVE) IMPLANT
GLOVE BIOGEL PI INDICATOR 8 (GLOVE) ×1
GLOVE ECLIPSE 8.0 STRL XLNG CF (GLOVE) ×2 IMPLANT
GLOVE SS BIOGEL STRL SZ 6.5 (GLOVE) ×1 IMPLANT
GLOVE SS BIOGEL STRL SZ 7 (GLOVE) ×1 IMPLANT
GLOVE SUPERSENSE BIOGEL SZ 6.5 (GLOVE) ×1
GLOVE SUPERSENSE BIOGEL SZ 7 (GLOVE) ×1
GLOVE SURG 8.5 LATEX PF (GLOVE) ×3 IMPLANT
GOWN STRL REUS W/ TWL LRG LVL3 (GOWN DISPOSABLE) ×3 IMPLANT
GOWN STRL REUS W/ TWL XL LVL3 (GOWN DISPOSABLE) IMPLANT
GOWN STRL REUS W/TWL LRG LVL3 (GOWN DISPOSABLE) ×4
GOWN STRL REUS W/TWL XL LVL3 (GOWN DISPOSABLE) ×2
HANDLE PNEUMATIC FOR CONSTEL (OPHTHALMIC) IMPLANT
KIT BASIN OR (CUSTOM PROCEDURE TRAY) ×2 IMPLANT
KIT ROOM TURNOVER OR (KITS) ×2 IMPLANT
MICROPICK 25G (MISCELLANEOUS)
NDL 18GX1X1/2 (RX/OR ONLY) (NEEDLE) ×1 IMPLANT
NDL 25GX 5/8IN NON SAFETY (NEEDLE) ×1 IMPLANT
NDL FILTER BLUNT 18X1 1/2 (NEEDLE) ×1 IMPLANT
NDL HYPO 30X.5 LL (NEEDLE) ×2 IMPLANT
NDL PRECISIONGLIDE 27X1.5 (NEEDLE) IMPLANT
NEEDLE 18GX1X1/2 (RX/OR ONLY) (NEEDLE) ×2 IMPLANT
NEEDLE 25GX 5/8IN NON SAFETY (NEEDLE) ×2 IMPLANT
NEEDLE FILTER BLUNT 18X 1/2SAF (NEEDLE) ×1
NEEDLE FILTER BLUNT 18X1 1/2 (NEEDLE) ×1 IMPLANT
NEEDLE HYPO 30X.5 LL (NEEDLE) ×4 IMPLANT
NEEDLE PRECISIONGLIDE 27X1.5 (NEEDLE) IMPLANT
NS IRRIG 1000ML POUR BTL (IV SOLUTION) ×2 IMPLANT
PACK VITRECTOMY CUSTOM (CUSTOM PROCEDURE TRAY) ×2 IMPLANT
PAD ARMBOARD 7.5X6 YLW CONV (MISCELLANEOUS) ×4 IMPLANT
PAK PIK VITRECTOMY CVS 25GA (OPHTHALMIC) ×2 IMPLANT
PENCIL BIPOLAR 25GA STR DISP (OPHTHALMIC RELATED) IMPLANT
PIC ILLUMINATED 25G (OPHTHALMIC) ×2
PICK MICROPICK 25G (MISCELLANEOUS) IMPLANT
PIK ILLUMINATED 25G (OPHTHALMIC) ×1 IMPLANT
PROBE LASER ILLUM FLEX CVD 25G (OPHTHALMIC) ×2 IMPLANT
REPL STRA BRUSH NDL (NEEDLE) IMPLANT
REPL STRA BRUSH NEEDLE (NEEDLE) IMPLANT
RESERVOIR BACK FLUSH (MISCELLANEOUS) IMPLANT
ROLLS DENTAL (MISCELLANEOUS) ×4 IMPLANT
SCRAPER DIAMOND 25GA (OPHTHALMIC RELATED) IMPLANT
SCRAPER DIAMOND DUST MEMBRANE (MISCELLANEOUS) ×2 IMPLANT
SPONGE SURGIFOAM ABS GEL 12-7 (HEMOSTASIS) ×2 IMPLANT
STOPCOCK 4 WAY LG BORE MALE ST (IV SETS) IMPLANT
SUT CHROMIC 7 0 TG140 8 (SUTURE) IMPLANT
SUT ETHILON 10 0 CS140 6 (SUTURE) IMPLANT
SUT ETHILON 9 0 TG140 8 (SUTURE) ×2 IMPLANT
SUT POLY NON ABSORB 10-0 8 STR (SUTURE) IMPLANT
SUT SILK 4 0 RB 1 (SUTURE) IMPLANT
SUT VICRYL 7 0 TG140 8 (SUTURE) ×1 IMPLANT
SYR 10ML LL (SYRINGE) IMPLANT
SYR 20CC LL (SYRINGE) ×2 IMPLANT
SYR BULB 3OZ (MISCELLANEOUS) ×2 IMPLANT
SYR TB 1ML LUER SLIP (SYRINGE) ×2 IMPLANT
TOWEL OR 17X24 6PK STRL BLUE (TOWEL DISPOSABLE) ×2 IMPLANT
TOWEL OR 17X26 10 PK STRL BLUE (TOWEL DISPOSABLE) ×2 IMPLANT
TUBING HIGH PRESS EXTEN 6IN (TUBING) IMPLANT
WATER STERILE IRR 1000ML POUR (IV SOLUTION) ×2 IMPLANT
WIPE INSTRUMENT VISIWIPE 73X73 (MISCELLANEOUS) ×2 IMPLANT

## 2017-03-20 NOTE — Anesthesia Preprocedure Evaluation (Addendum)
Anesthesia Evaluation  Patient identified by MRN, date of birth, ID band Patient awake    Reviewed: Allergy & Precautions, NPO status , Patient's Chart, lab work & pertinent test results  Airway Mallampati: I  TM Distance: >3 FB Neck ROM: Full    Dental  (+) Lower Dentures, Upper Dentures   Pulmonary shortness of breath and with exertion, former smoker,    Pulmonary exam normal breath sounds clear to auscultation       Cardiovascular hypertension, Pt. on medications and Pt. on home beta blockers + CAD, + Past MI and + Peripheral Vascular Disease  Normal cardiovascular exam Rhythm:Regular Rate:Normal  MI Anterior Wall 05/2009 Chronic occlusion of LICA, moderate stenosis or RICA Ischemic CM LVEF 55-60% on last echo 2012 Proximal LAD occlusion, LCx OM1- 80%, OM2 75%   Neuro/Psych Lens fragment OS negative psych ROS   GI/Hepatic negative GI ROS, Neg liver ROS,   Endo/Other  diabetes, Well Controlled, Type 2, Oral Hypoglycemic AgentsHyperlipidemia  Renal/GU   negative genitourinary   Musculoskeletal  (+) Arthritis ,   Abdominal (+) - obese,   Peds  Hematology  (+) anemia ,   Anesthesia Other Findings   Reproductive/Obstetrics                            Anesthesia Physical Anesthesia Plan  ASA: III  Anesthesia Plan: General   Post-op Pain Management:    Induction: Intravenous  PONV Risk Score and Plan: 3 and Ondansetron, Dexamethasone, Propofol infusion and Treatment may vary due to age or medical condition  Airway Management Planned: Oral ETT  Additional Equipment:   Intra-op Plan:   Post-operative Plan: Extubation in OR  Informed Consent: I have reviewed the patients History and Physical, chart, labs and discussed the procedure including the risks, benefits and alternatives for the proposed anesthesia with the patient or authorized representative who has indicated his/her  understanding and acceptance.   Dental advisory given  Plan Discussed with: CRNA, Anesthesiologist and Surgeon  Anesthesia Plan Comments:         Anesthesia Quick Evaluation

## 2017-03-20 NOTE — Anesthesia Procedure Notes (Signed)
Procedure Name: Intubation Date/Time: 03/20/2017 11:40 AM Performed by: Imagene Riches Pre-anesthesia Checklist: Patient identified, Emergency Drugs available, Suction available and Patient being monitored Patient Re-evaluated:Patient Re-evaluated prior to induction Oxygen Delivery Method: Circle system utilized Preoxygenation: Pre-oxygenation with 100% oxygen Induction Type: IV induction Ventilation: Mask ventilation without difficulty and Oral airway inserted - appropriate to patient size Laryngoscope Size: Miller and 2 Grade View: Grade I Tube type: Oral Tube size: 7.5 mm Number of attempts: 1 Airway Equipment and Method: Stylet Placement Confirmation: ETT inserted through vocal cords under direct vision,  positive ETCO2 and breath sounds checked- equal and bilateral Secured at: 22 cm Tube secured with: Tape Dental Injury: Teeth and Oropharynx as per pre-operative assessment

## 2017-03-20 NOTE — Op Note (Signed)
NAME:  Sean Hartman, Sean Hartman                  ACCOUNT NO.:  MEDICAL RECORD NO.:  [id number]  LOCATION:                                 FACILITY:  PHYSICIAN:  John D. Zigmund Daniel, M.D.      DATE OF BIRTH:  DATE OF PROCEDURE:  03/20/2017 DATE OF DISCHARGE:                              OPERATIVE REPORT   ADMISSION DIAGNOSIS:  Retained lens material after cataract surgery, left eye.  PROCEDURES:  Pars plana vitrectomy, removal of retained lens material from vitreous retinal photocoagulation, gas-fluid exchange in the left eye.  SURGEON:  Chrystie Nose. Zigmund Daniel, M.D.  ASSISTANT:  Deatra Ina.  ANESTHESIA:  General.  DETAILS:  After proper endotracheal anesthesia, the attention was carried to the retinal periphery.  The indirect ophthalmoscope laser was moved into place.  The 723 burns were placed around the retinal periphery with a power between 500 and 800 mW, 1000 microns each, and 0.1 seconds each.  The attention was then carried to the pars plana area where 25-gauge trocars were placed at 10 o'clock, 2 o'clock, and 4 o'clock; infusion at 4 o'clock.  Provisc placed on the corneal surface. The pars plana vitrectomy was begun just behind the pseudophakias. Infusion was attached at 4 o'clock.  The pseudophakias was securely positioned in the capsular bag.  The posterior capsule was open.  No vitreous strands were seen from the wound to the iris.  Vitrectomy was carried into the mid vitreous where a small white cortical lens material pieces were seen.  These were carefully removed under low suction and rapid cutting.  Vitrectomy was carried in a core fashion down to the macular region.  Macular degeneration was seen with drusen and pigment. Additional small cortical remnants were seen.  Several large sheets of lens material were encountered.  These were carefully removed with the vitreous cutter.  Attention was carried to the 6 o'clock area.  Some small nuclear fragments and medium-sized  white lens cortical fragments were seen.  These were carefully removed under low suction and rapid cutting.  The wide field viewing lens was placed on the BIOM for extreme wide viewing.  The vitrectomy was carried into the mid periphery and then into the far periphery down to the vitreous base for 360 degrees. Scleral depression was used at 6 o'clock to bring lens pieces into place.  These were carefully removed.  At this point, additional areas that were had not been previously lasered were seen.  The endolaser was positioned in the eye.  The 380 burns were placed around the extreme periphery with a power of 360 mW, 1000 microns each, and 0.1 seconds each.  Additional rinsing and cleaning of the vitreous cavity were performed with suction and several small pieces were seen to move past the cutter and then into the cutter.  Once all pieces were removed, a 30% gas-fluid exchange was carried out.  The instruments were removed from the eye.  The trocars were removed from the eye.  The wounds were tested and found to be secure.  Polymyxin and ceftazidime were rinsed around the globe for antibiotic coverage.  Closing pressure was 10 with a Barraquer tonometer. Decadron 10 mg  was injected into the lower subconjunctival space. Marcaine was injected around the globe for postop pain.  Polysporin ophthalmic ointment, a patch, and shield were placed.  The patient was awakened and taken to recovery in satisfactory condition.     Chrystie Nose. Zigmund Daniel, M.D.     JDM/MEDQ  D:  03/20/2017  T:  03/20/2017  Job:  [id number]

## 2017-03-20 NOTE — Anesthesia Postprocedure Evaluation (Signed)
Anesthesia Post Note  Patient: Sean Hartman  Procedure(s) Performed: 25 GAUGE PARS PLANA VITRECTOMY WITH 20 GAUGE MVR PORT (Left Eye)     Patient location during evaluation: PACU Anesthesia Type: General Level of consciousness: awake and alert and oriented Pain management: pain level controlled Vital Signs Assessment: post-procedure vital signs reviewed and stable Respiratory status: spontaneous breathing, nonlabored ventilation and respiratory function stable Cardiovascular status: blood pressure returned to baseline and stable Postop Assessment: no apparent nausea or vomiting Anesthetic complications: no    Last Vitals:  Vitals:   03/20/17 1344 03/20/17 1345  BP: 130/75   Pulse: 72 (!) 59  Resp: 17 16  Temp:    SpO2: 94% 93%    Last Pain:  Vitals:   03/20/17 1345  TempSrc:   PainSc: 0-No pain                 Camarion Weier A.

## 2017-03-20 NOTE — Transfer of Care (Signed)
Immediate Anesthesia Transfer of Care Note  Patient: Sean Hartman  Procedure(s) Performed: 25 GAUGE PARS PLANA VITRECTOMY WITH 20 GAUGE MVR PORT (Left Eye)  Patient Location: PACU  Anesthesia Type:General  Level of Consciousness: awake, alert  and oriented  Airway & Oxygen Therapy: Patient Spontanous Breathing and Patient connected to nasal cannula oxygen  Post-op Assessment: Report given to RN and Post -op Vital signs reviewed and stable  Post vital signs: Reviewed and stable  Last Vitals:  Vitals:   03/20/17 0912 03/20/17 1245  BP: 137/77   Pulse: (!) 51   Resp: 18   Temp: (!) 36.2 C (!) (P) 36.3 C  SpO2: 97%     Last Pain:  Vitals:   03/20/17 0912  TempSrc: Oral         Complications: No apparent anesthesia complications

## 2017-03-20 NOTE — H&P (Signed)
I examined the patient today and there is no change in the medical status 

## 2017-03-20 NOTE — Brief Op Note (Signed)
Brief Operative note   Preoperative diagnosis:  retained lens material left eye Postoperative diagnosis  Retained lens material left eye  Procedures: Pars plana vitrectomy, laser, removal of lens material from the vitreous, gas injection, left eye  Surgeon:  Hayden Pedro, MD...  Assistant:  Deatra Ina SA    Anesthesia: General  Specimen: none  Estimated blood loss:  1cc  Complications: none  Patient sent to PACU in good condition  Composed by Hayden Pedro MD  Dictation number: [id number] [phone]

## 2017-03-21 ENCOUNTER — Encounter (HOSPITAL_COMMUNITY): Payer: Self-pay | Admitting: Ophthalmology

## 2017-03-21 DIAGNOSIS — H59022 Cataract (lens) fragments in eye following cataract surgery, left eye: Secondary | ICD-10-CM | POA: Diagnosis not present

## 2017-03-21 MED ORDER — PREDNISOLONE ACETATE 1 % OP SUSP: 1.0000 [drp] | Freq: Four times a day (QID) | OPHTHALMIC | 0 refills | Status: DC

## 2017-03-21 MED ORDER — GATIFLOXACIN 0.5 % OP SOLN: 1.0000 [drp] | Freq: Four times a day (QID) | OPHTHALMIC | Status: DC

## 2017-03-21 MED ORDER — BRIMONIDINE TARTRATE 0.2 % OP SOLN: 1.0000 [drp] | Freq: Two times a day (BID) | OPHTHALMIC | 12 refills | Status: DC

## 2017-03-21 MED ORDER — BACITRACIN-POLYMYXIN B 500-10000 UNIT/GM OP OINT: 1.0000 "application " | TOPICAL_OINTMENT | Freq: Three times a day (TID) | OPHTHALMIC | 0 refills | Status: DC

## 2017-03-21 NOTE — Discharge Summary (Signed)
Discharge summary not needed on [facility] patients per medical records. 

## 2017-03-21 NOTE — Progress Notes (Signed)
03/21/2017, 6:21 AM  Mental Status:  Awake, Alert, Oriented  Anterior segment: Cornea  Clear    Anterior Chamber Clear    Lens:   Clear, IOL  Intra Ocular Pressure 14 mmHg with Tonopen  Vitreous: Clear 10%gas bubble  Retina:  Attached Good laser reaction   Impression: Excellent result Retina attached  Final Diagnosis: Principal Problem:   Retained lens material following cataract surgery of left eye   Plan: start post operative eye drops.  Discharge to home.  Give post operative instructions  Sean Hartman 03/21/2017, 6:21 AM

## 2017-03-21 NOTE — Progress Notes (Signed)
Pt discharged home in stable condition after going over discharge instructions with no concerns voiced. AVS given to pt before leaving unit 

## 2017-03-27 ENCOUNTER — Encounter (INDEPENDENT_AMBULATORY_CARE_PROVIDER_SITE_OTHER): Payer: Medicare HMO | Admitting: Ophthalmology

## 2017-03-27 DIAGNOSIS — H2702 Aphakia, left eye: Secondary | ICD-10-CM

## 2017-04-17 ENCOUNTER — Encounter (INDEPENDENT_AMBULATORY_CARE_PROVIDER_SITE_OTHER): Payer: Medicare HMO | Admitting: Ophthalmology

## 2017-04-17 DIAGNOSIS — H2702 Aphakia, left eye: Secondary | ICD-10-CM

## 2017-06-26 ENCOUNTER — Encounter (INDEPENDENT_AMBULATORY_CARE_PROVIDER_SITE_OTHER): Payer: Medicare HMO | Admitting: Ophthalmology

## 2017-06-26 DIAGNOSIS — E11319 Type 2 diabetes mellitus with unspecified diabetic retinopathy without macular edema: Secondary | ICD-10-CM | POA: Diagnosis not present

## 2017-06-26 DIAGNOSIS — H353111 Nonexudative age-related macular degeneration, right eye, early dry stage: Secondary | ICD-10-CM | POA: Diagnosis not present

## 2017-06-26 DIAGNOSIS — H43813 Vitreous degeneration, bilateral: Secondary | ICD-10-CM

## 2017-06-26 DIAGNOSIS — H35033 Hypertensive retinopathy, bilateral: Secondary | ICD-10-CM | POA: Diagnosis not present

## 2017-06-26 DIAGNOSIS — H353122 Nonexudative age-related macular degeneration, left eye, intermediate dry stage: Secondary | ICD-10-CM

## 2017-06-26 DIAGNOSIS — I10 Essential (primary) hypertension: Secondary | ICD-10-CM | POA: Diagnosis not present

## 2017-06-26 DIAGNOSIS — E113292 Type 2 diabetes mellitus with mild nonproliferative diabetic retinopathy without macular edema, left eye: Secondary | ICD-10-CM

## 2017-07-04 ENCOUNTER — Other Ambulatory Visit: Payer: Self-pay | Admitting: Cardiovascular Disease

## 2017-07-04 DIAGNOSIS — I6523 Occlusion and stenosis of bilateral carotid arteries: Secondary | ICD-10-CM

## 2017-07-12 ENCOUNTER — Other Ambulatory Visit: Payer: Self-pay | Admitting: Cardiovascular Disease

## 2017-07-12 DIAGNOSIS — I10 Essential (primary) hypertension: Secondary | ICD-10-CM

## 2017-07-12 DIAGNOSIS — R001 Bradycardia, unspecified: Secondary | ICD-10-CM

## 2017-07-12 DIAGNOSIS — I2510 Atherosclerotic heart disease of native coronary artery without angina pectoris: Secondary | ICD-10-CM

## 2017-07-18 ENCOUNTER — Ambulatory Visit (HOSPITAL_COMMUNITY)
Admission: RE | Admit: 2017-07-18 | Discharge: 2017-07-18 | Disposition: A | Discharge status: Home / Self Care | Payer: Medicare HMO | Source: Ambulatory Visit | Attending: Cardiovascular Disease | Admitting: Cardiovascular Disease

## 2017-07-18 DIAGNOSIS — I6523 Occlusion and stenosis of bilateral carotid arteries: Secondary | ICD-10-CM

## 2017-07-26 ENCOUNTER — Telehealth: Payer: Self-pay

## 2017-07-26 DIAGNOSIS — I739 Peripheral vascular disease, unspecified: Principal | ICD-10-CM

## 2017-07-26 DIAGNOSIS — I779 Disorder of arteries and arterioles, unspecified: Secondary | ICD-10-CM

## 2017-07-26 NOTE — Telephone Encounter (Signed)
Carotid duplex ordered to be scheduled in 1 year.

## 2017-07-26 NOTE — Telephone Encounter (Signed)
-----   Message from [name], MD sent at 07/25/2017 11:10 PM EST ----- FU 12 months. Continue medical therapy

## 2017-08-13 ENCOUNTER — Other Ambulatory Visit: Payer: Self-pay | Admitting: Cardiovascular Disease

## 2017-08-13 DIAGNOSIS — I10 Essential (primary) hypertension: Secondary | ICD-10-CM

## 2017-08-13 DIAGNOSIS — R001 Bradycardia, unspecified: Secondary | ICD-10-CM

## 2017-08-13 DIAGNOSIS — I2510 Atherosclerotic heart disease of native coronary artery without angina pectoris: Secondary | ICD-10-CM

## 2017-09-01 ENCOUNTER — Other Ambulatory Visit: Payer: Self-pay | Admitting: Cardiovascular Disease

## 2017-09-01 DIAGNOSIS — I2510 Atherosclerotic heart disease of native coronary artery without angina pectoris: Secondary | ICD-10-CM

## 2017-09-01 DIAGNOSIS — I10 Essential (primary) hypertension: Secondary | ICD-10-CM

## 2017-09-01 DIAGNOSIS — R001 Bradycardia, unspecified: Secondary | ICD-10-CM

## 2017-09-03 NOTE — Telephone Encounter (Signed)
Scheduled patient for follow-up May 1. DPR understands he will get refills for a year at that time.

## 2017-09-14 ENCOUNTER — Other Ambulatory Visit: Payer: Self-pay | Admitting: Cardiovascular Disease

## 2017-10-03 ENCOUNTER — Encounter: Payer: Self-pay | Admitting: Physician Assistant

## 2017-10-03 ENCOUNTER — Ambulatory Visit: Payer: Medicare HMO | Admitting: Physician Assistant

## 2017-10-03 VITALS — BP 146/68 | HR 62 | Ht 71.0 in | Wt 162.1 lb

## 2017-10-03 DIAGNOSIS — I2510 Atherosclerotic heart disease of native coronary artery without angina pectoris: Secondary | ICD-10-CM

## 2017-10-03 DIAGNOSIS — I10 Essential (primary) hypertension: Secondary | ICD-10-CM | POA: Diagnosis not present

## 2017-10-03 DIAGNOSIS — I440 Atrioventricular block, first degree: Secondary | ICD-10-CM | POA: Diagnosis not present

## 2017-10-03 DIAGNOSIS — I5032 Chronic diastolic (congestive) heart failure: Secondary | ICD-10-CM | POA: Insufficient documentation

## 2017-10-03 DIAGNOSIS — R001 Bradycardia, unspecified: Secondary | ICD-10-CM

## 2017-10-03 DIAGNOSIS — I779 Disorder of arteries and arterioles, unspecified: Secondary | ICD-10-CM

## 2017-10-03 DIAGNOSIS — I255 Ischemic cardiomyopathy: Secondary | ICD-10-CM | POA: Diagnosis not present

## 2017-10-03 DIAGNOSIS — I739 Peripheral vascular disease, unspecified: Secondary | ICD-10-CM

## 2017-10-03 MED ORDER — FUROSEMIDE 40 MG PO TABS: 40.0000 mg | ORAL_TABLET | Freq: Two times a day (BID) | ORAL | 3 refills | Status: DC

## 2017-10-03 MED ORDER — AMLODIPINE BESYLATE 5 MG PO TABS: 5.0000 mg | ORAL_TABLET | Freq: Every day | ORAL | 3 refills | Status: DC

## 2017-10-03 MED ORDER — CARVEDILOL 3.125 MG PO TABS: ORAL_TABLET | ORAL | 3 refills | Status: DC

## 2017-10-03 MED ORDER — NITROGLYCERIN 0.4 MG SL SUBL: SUBLINGUAL_TABLET | SUBLINGUAL | 6 refills | Status: DC

## 2017-10-03 MED ORDER — LOSARTAN POTASSIUM 100 MG PO TABS: 100.0000 mg | ORAL_TABLET | Freq: Every day | ORAL | 3 refills | Status: DC

## 2017-10-03 NOTE — Progress Notes (Signed)
Cardiology Office Note:    Date:  10/03/2017   ID:  [name], DOB 14-Sep-1922, MRN [id number]  PCP:  Sean Housekeeper, MD  Cardiologist:  Sean Mocha, MD   Referring MD: Sean Housekeeper, MD   Chief Complaint  Patient presents with  . Follow-up    CAD, CHF    History of Present Illness:    Sean Hartman is a 84 y.o. male with a hx of anterior MI in 2010 treated with PCI of the LAD, ischemic cardiomyopathy, HLD, HTN, DM2, and carotid artery stenosis with chronic occlusion of the left ICA and moderate stenosis of the right ICA. Last echo in 2012 with preserved EF and grade 1 DD. He is also intolerant to statin medications.  He last saw Dr. Burt Knack in clinic on 07/03/16 and was s/p partial colectomy. He was doing very well at that visit. His coreg dose was reduced to 3.125 mg BID for marked sinus bradycardia. He continued ASA, norvasc, and losartan.   He presents today for his yearly follow up. He is doing well on ASA, coreg, norvasc, and losartan. He had a repeat carotid artery duplex on 07/18/17 and continued medical therapy was recommended. Overall he is doing very well. EKG today with HR 63 and first degree heart block, but he is asymptomatic. He denies chest pain, palpitations, shortness of breath, orthopnea, lower extremity swelling, dizziness, syncope, and recent falls. He is doing well on ASA without bleeding problems.    Past Medical History:  Diagnosis Date  . Anemia    fe deficient  . CAD (coronary artery disease)    status post anterior wall MI November 2010  . Carotid stenosis    with chronic occlusion of LICA and moderate RICA stenosis  . Colon cancer ([place])   . DM2 (diabetes mellitus, type 2) ([place])   . Essential hypertension   . HLD (hyperlipidemia)   . Ischemic cardiomyopathy     Past Surgical History:  Procedure Laterality Date  . [place] VITRECTOMY WITH 20 GAUGE MVR PORT Left 03/20/2017  . 25 GAUGE PARS PLANA VITRECTOMY WITH 20 GAUGE MVR  PORT Left 03/20/2017   Procedure: 25 GAUGE PARS PLANA VITRECTOMY WITH 20 GAUGE MVR PORT;  Surgeon: Hayden Pedro, MD;  Location: [facility];  Service: Ophthalmology;  Laterality: Left;  . CATARACT EXTRACTION W/ INTRAOCULAR LENS  IMPLANT, BILATERAL    . COLECTOMY    . CORONARY ANGIOPLASTY WITH STENT PLACEMENT     percutaneous transluminal coronary angioplasty and bare-metal stent to left anterior descending  . INGUINAL HERNIA REPAIR Left     Current Medications: Current Meds  Medication Sig  . amLODipine (NORVASC) 5 MG tablet Take 1 tablet (5 mg total) by mouth daily.  [name] aspirin (ASPIR-81) 81 MG EC tablet Take 81 mg by mouth daily.    . bacitracin-polymyxin b (POLYSPORIN) ophthalmic ointment Place 1 application into the left eye 3 (three) times daily. apply to eye every 12 hours while awake  . brimonidine (ALPHAGAN) 0.2 % ophthalmic solution Place 2 drops into the left eye every 2 (two) hours.  . brimonidine (ALPHAGAN) 0.2 % ophthalmic solution Place 1 drop into the left eye 2 (two) times daily.  . carvedilol (COREG) 3.125 MG tablet TAKE 1 TABLET BY MOUTH TWICE DAILY WITH A MEAL  . Dextromethorphan-Guaifenesin (MUCINEX DM PO) Take 10 mLs by mouth every 8 (eight) hours as needed. For cough  . famotidine (PEPCID) 40 MG tablet Take 1 Tablet by mouth once daily (  Patient taking differently: Take 40mg   by mouth once daily)  . furosemide (LASIX) 40 MG tablet Take 1 tablet (40 mg total) by mouth 2 (two) times daily.  [name] gabapentin (NEURONTIN) 100 MG capsule Take 100 mg by mouth daily.  [name] gatifloxacin (ZYMAXID) 0.5 % SOLN Place 1 drop into the left eye 4 (four) times daily.  [name] levothyroxine (SYNTHROID, LEVOTHROID) 100 MCG tablet Take 100 mcg by mouth daily.    [name] losartan (COZAAR) 100 MG tablet Take 1 tablet (100 mg total) by mouth daily.  . nitroGLYCERIN (NITROSTAT) 0.4 MG SL tablet DISSOLVE 1 TABLET UNDER TONGUE EVERY 5 MINUTES AS NEEDED FOR CHEST PAIN UP TO 3 DOSES  . prednisoLONE acetate (PRED FORTE)  1 % ophthalmic suspension Place 1 drop into the left eye 4 (four) times daily.  . sitaGLIPtin (JANUVIA) 100 MG tablet Take 100 mg by mouth as needed (diabetes).   . SPIRIVA HANDIHALER 18 MCG inhalation capsule Place 18 mcg into inhaler and inhale daily as needed (shortness of breath).      Allergies:   Patient has no known allergies.   Social History   Socioeconomic History  . Marital status: Widowed    Spouse name: Not on file  . Number of children: Not on file  . Years of education: Not on file  . Highest education level: Not on file  Occupational History  . Not on file  Social Needs  . Financial resource strain: Not on file  . Food insecurity:    Worry: Not on file    Inability: Not on file  . Transportation needs:    Medical: Not on file    Non-medical: Not on file  Tobacco Use  . Smoking status: Former Research scientist (life sciences)  . Smokeless tobacco: Never Used  Substance and Sexual Activity  . Alcohol use: No  . Drug use: No  . Sexual activity: Not on file  Lifestyle  . Physical activity:    Days per week: Not on file    Minutes per session: Not on file  . Stress: Not on file  Relationships  . Social connections:    Talks on phone: Not on file    Gets together: Not on file    Attends religious service: Not on file    Active member of club or organization: Not on file    Attends meetings of clubs or organizations: Not on file    Relationship status: Not on file  Other Topics Concern  . Not on file  Social History Narrative   Lives alone in [place]. Has a son who lives locally. Retired from working at [employer].      Family History: The patient's family history includes Hypertension in his father.  ROS:   Please see the history of present illness.     All other systems reviewed and are negative.  EKGs/Labs/Other Studies Reviewed:    The following studies were reviewed today:  Carotid duplex 07/18/17: stable Final Interpretation: Right Carotid: Velocities in the right ICA  are consistent with a 1-39% stenosis.        Hemodynamically significant plaque >50% visualized in the CCA.  Left Carotid: Velocities in the left ICA are consistent with a chronic occlusion       with rconstitution distally. Non-hemodynamically significant       plaque noted in the CCA.  Vertebrals: Both vertebral arteries were patent with antegrade flow. Subclavians: Right subclavian artery flow was disturbed. Normal flow       hemodynamics  were seen in the left subclavian artery.    EKG:  EKG is ordered today.  The ekg ordered today demonstrates HR 63 with first degree heart block.  Recent Labs: 03/20/2017: BUN 16; Creatinine, Ser 0.98; Hemoglobin 13.6; Platelets 191; Potassium 3.6; Sodium 137  Recent Lipid Panel    Component Value Date/Time   CHOL (H) 04/15/2009 0320    212        ATP III CLASSIFICATION:  <200     mg/dL   Desirable  200-239  mg/dL   Borderline High  >=240    mg/dL   High          TRIG 79 04/15/2009 0320   HDL 38 (L) 04/15/2009 0320   CHOLHDL 5.6 04/15/2009 0320   VLDL 16 04/15/2009 0320   LDLCALC (H) 04/15/2009 0320    158        Total Cholesterol/HDL:CHD Risk Coronary Heart Disease Risk Table                     Men   Women  1/2 Average Risk   3.4   3.3  Average Risk       5.0   4.4  2 X Average Risk   9.6   7.1  3 X Average Risk  23.4   11.0        Use the calculated Patient Ratio above and the CHD Risk Table to determine the patient's CHD Risk.        ATP III CLASSIFICATION (LDL):  <100     mg/dL   Optimal  100-129  mg/dL   Near or Above                    Optimal  130-159  mg/dL   Borderline  160-189  mg/dL   High  >190     mg/dL   Very High    Physical Exam:    VS:  BP (!) 146/68   Pulse 62   Ht 5\' 11"  (1.803 m)   Wt 162 lb 1.9 oz (73.5 kg)   SpO2 96%   BMI 22.61 kg/m     Wt Readings from Last 3 Encounters:  10/03/17 162 lb 1.9 oz (73.5 kg)  03/20/17 160 lb (72.6 kg)  07/03/16 162 lb (73.5 kg)       GEN: Well nourished, well developed in no acute distress HEENT: Normal NECK: No JVD; right carotid bruits LYMPHATICS: No lymphadenopathy CARDIAC: RRR, no murmurs, rubs, gallops RESPIRATORY:  Clear to auscultation without rales, wheezing or rhonchi  ABDOMEN: Soft, non-tender, non-distended MUSCULOSKELETAL:  No edema; No deformity  SKIN: Warm and dry NEUROLOGIC:  Alert and oriented x 3 PSYCHIATRIC:  Normal affect   ASSESSMENT:    1. Atherosclerosis of coronary artery of native heart without angina pectoris, unspecified vessel or lesion type   2. Chronic diastolic heart failure ([place])   3. Ischemic cardiomyopathy   4. First degree heart block   5. Carotid artery disease, unspecified laterality, unspecified type ([place])   6. Essential hypertension   7. Bradycardia   8. Coronary artery disease involving native coronary artery of native heart without angina pectoris    PLAN:    In order of problems listed above:  Atherosclerosis of coronary artery of native heart without angina pectoris, unspecified vessel or lesion type No chest pain. Continue current regimen.   Chronic diastolic heart failure (HCC) Ischemic cardiomyopathy He is euvolemic. He only takes lasix 40 mg daily,  no evening dose. No changes in medications. Will check a BMP today.  First degree heart block He is doing well on 3.125 mg BID of coreg. He is asymptomatic, without dizziness or near syncope. Will not make changes at this time. Advised him to call our office if he develops these symptoms.   Carotid artery disease, unspecified laterality, unspecified type ([place]) Duplex 07/2017 - medical therapy recommended. Right carotid bruit appreciated. Repeat carotid dopplers in 1 year.   See Dr. Burt Knack in 1 year.    Medication Adjustments/Labs and Tests Ordered: Current medicines are reviewed at length with the patient today.  Concerns regarding medicines are outlined above.  Orders Placed This Encounter  Procedures   . Basic Metabolic Panel (BMET)  . EKG 12-Lead   Meds ordered this encounter  Medications  . amLODipine (NORVASC) 5 MG tablet    Sig: Take 1 tablet (5 mg total) by mouth daily.    Dispense:  90 tablet    Refill:  3  . carvedilol (COREG) 3.125 MG tablet    Sig: TAKE 1 TABLET BY MOUTH TWICE DAILY WITH A MEAL    Dispense:  180 tablet    Refill:  3    Please consider 90 day supplies to promote better adherence  . furosemide (LASIX) 40 MG tablet    Sig: Take 1 tablet (40 mg total) by mouth 2 (two) times daily.    Dispense:  180 tablet    Refill:  3  . losartan (COZAAR) 100 MG tablet    Sig: Take 1 tablet (100 mg total) by mouth daily.    Dispense:  90 tablet    Refill:  3  . nitroGLYCERIN (NITROSTAT) 0.4 MG SL tablet    Sig: DISSOLVE 1 TABLET UNDER TONGUE EVERY 5 MINUTES AS NEEDED FOR CHEST PAIN UP TO 3 DOSES    Dispense:  25 tablet    Refill:  6    Signed, Ledora Bottcher, [place]  10/03/2017 3:37 PM    [facility]

## 2017-10-03 NOTE — Patient Instructions (Signed)
Medication Instructions:  1. Your physician recommends that you continue on your current medications as directed. Please refer to the Current Medication list given to you today.  REFILLS HAVE BEEN SENT IN FOR YOUR CARDIAC MEDICATIONS   Labwork: TODAY BMET  Testing/Procedures: 1. Your physician has requested that you have a carotid duplex. This test is an ultrasound of the carotid arteries in your neck. It looks at blood flow through these arteries that supply the brain with blood. Allow one hour for this exam. There are no restrictions or special instructions.    Follow-Up: Your physician wants you to follow-up in: [place] will receive a reminder letter in the mail two months in advance. If you don't receive a letter, please call our office to schedule the follow-up appointment.   Any Other Special Instructions Will Be Listed Below (If Applicable).     If you need a refill on your cardiac medications before your next appointment, please call your pharmacy.

## 2017-10-10 DIAGNOSIS — E039 Hypothyroidism, unspecified: Secondary | ICD-10-CM | POA: Insufficient documentation

## 2017-10-11 ENCOUNTER — Other Ambulatory Visit: Payer: Self-pay | Admitting: Cardiovascular Disease

## 2017-10-31 ENCOUNTER — Other Ambulatory Visit: Payer: Self-pay | Admitting: Cardiovascular Disease

## 2017-11-01 ENCOUNTER — Other Ambulatory Visit: Payer: Self-pay | Admitting: *Deleted

## 2018-02-13 ENCOUNTER — Encounter (INDEPENDENT_AMBULATORY_CARE_PROVIDER_SITE_OTHER): Payer: Medicare HMO | Admitting: Ophthalmology

## 2018-05-31 ENCOUNTER — Telehealth: Payer: Self-pay | Admitting: Cardiovascular Disease

## 2018-05-31 MED ORDER — FAMOTIDINE 20 MG PO TABS: ORAL_TABLET | ORAL | 3 refills | Status: DC

## 2018-05-31 NOTE — Telephone Encounter (Signed)
New Message   Pt c/o medication issue:  1. Name of Medication: Pepcid   2. How are you currently taking this medication (dosage and times per day)? 40mg  1x daily   3. Are you having a reaction (difficulty breathing--STAT)? no  4. What is your medication issue? Pharmacy is calling saying they're out of 40mg , would like to give him 20mg  to take 2 tablets 1x day. Is this okay?

## 2018-05-31 NOTE — Telephone Encounter (Signed)
Spoke to the pharmacy and informed them that it would be ok to give the patient 20 mg tablets of Famotidine since they do not have 40 mg available.  The patient will take 20 mg tablets x 2 daily.

## 2018-07-01 ENCOUNTER — Encounter (INDEPENDENT_AMBULATORY_CARE_PROVIDER_SITE_OTHER): Payer: Medicare HMO | Admitting: Ophthalmology

## 2018-07-03 ENCOUNTER — Encounter (INDEPENDENT_AMBULATORY_CARE_PROVIDER_SITE_OTHER): Payer: Medicare HMO | Admitting: Ophthalmology

## 2018-07-03 DIAGNOSIS — H353122 Nonexudative age-related macular degeneration, left eye, intermediate dry stage: Secondary | ICD-10-CM

## 2018-07-03 DIAGNOSIS — H353111 Nonexudative age-related macular degeneration, right eye, early dry stage: Secondary | ICD-10-CM | POA: Diagnosis not present

## 2018-07-03 DIAGNOSIS — E113392 Type 2 diabetes mellitus with moderate nonproliferative diabetic retinopathy without macular edema, left eye: Secondary | ICD-10-CM | POA: Diagnosis not present

## 2018-07-03 DIAGNOSIS — H35033 Hypertensive retinopathy, bilateral: Secondary | ICD-10-CM

## 2018-07-03 DIAGNOSIS — E11319 Type 2 diabetes mellitus with unspecified diabetic retinopathy without macular edema: Secondary | ICD-10-CM

## 2018-07-03 DIAGNOSIS — I10 Essential (primary) hypertension: Secondary | ICD-10-CM

## 2018-07-26 ENCOUNTER — Ambulatory Visit (HOSPITAL_COMMUNITY)
Admission: RE | Admit: 2018-07-26 | Discharge: 2018-07-26 | Disposition: A | Discharge status: Home / Self Care | Payer: Medicare HMO | Source: Ambulatory Visit | Attending: Cardiovascular Disease | Admitting: Cardiovascular Disease

## 2018-07-26 DIAGNOSIS — I779 Disorder of arteries and arterioles, unspecified: Secondary | ICD-10-CM

## 2018-07-26 DIAGNOSIS — I739 Peripheral vascular disease, unspecified: Secondary | ICD-10-CM

## 2018-09-18 ENCOUNTER — Telehealth: Payer: Self-pay

## 2018-09-18 DIAGNOSIS — I739 Peripheral vascular disease, unspecified: Principal | ICD-10-CM

## 2018-09-18 DIAGNOSIS — I779 Disorder of arteries and arterioles, unspecified: Secondary | ICD-10-CM

## 2018-09-18 NOTE — Telephone Encounter (Signed)
Reviewed results with patient's DPR who verbalized understanding.   Repeat carotids ordered to be scheduled in February, 2021. DPR was grateful for call and agrees with treatment plan.

## 2018-09-18 NOTE — Telephone Encounter (Signed)
-----   Message from [name], MD sent at 07/27/2018  8:32 AM EST ----- Stable carotid disease noted. Chronic occlusion of the LICA, patent RICA. Continue annual follow-up

## 2018-10-17 ENCOUNTER — Other Ambulatory Visit: Payer: Self-pay | Admitting: Cardiovascular Disease

## 2018-10-17 ENCOUNTER — Telehealth: Payer: Self-pay | Admitting: Cardiovascular Disease

## 2018-10-17 MED ORDER — AMLODIPINE BESYLATE 5 MG PO TABS: 5.0000 mg | ORAL_TABLET | Freq: Every day | ORAL | 0 refills | Status: DC

## 2018-10-17 NOTE — Telephone Encounter (Signed)
New message    Spoke w/ pt son about appt on 05.18.20. Pt son said we could call and do a phone visit with his father. He will be there so his phone number is listed in the appt notes.      Virtual Visit Pre-Appointment Phone Call  "(Name), I am calling you today to discuss your upcoming appointment. We are currently trying to limit exposure to the virus that causes COVID-19 by seeing patients at home rather than in the office."  1. "What is the BEST phone number to call the day of the visit?" - include this in appointment notes  2. Do you have or have access to (through a family member/friend) a smartphone with video capability that we can use for your visit?" a. If yes - list this number in appt notes as cell (if different from BEST phone #) and list the appointment type as a VIDEO visit in appointment notes b. If no - list the appointment type as a PHONE visit in appointment notes  3. Confirm consent - "In the setting of the current Covid19 crisis, you are scheduled for a (phone or video) visit with your provider on (date) at (time).  Just as we do with many in-office visits, in order for you to participate in this visit, we must obtain consent.  If you'd like, I can send this to your mychart (if signed up) or email for you to review.  Otherwise, I can obtain your verbal consent now.  All virtual visits are billed to your insurance company just like a normal visit would be.  By agreeing to a virtual visit, we'd like you to understand that the technology does not allow for your provider to perform an examination, and thus may limit your provider's ability to fully assess your condition. If your provider identifies any concerns that need to be evaluated in person, we will make arrangements to do so.  Finally, though the technology is pretty good, we cannot assure that it will always work on either your or our end, and in the setting of a video visit, we may have to convert it to a phone-only  visit.  In either situation, we cannot ensure that we have a secure connection.  Are you willing to proceed?" STAFF: Did the patient verbally acknowledge consent to telehealth visit? Document YES/NO here: yes  4. Advise patient to be prepared - "Two hours prior to your appointment, go ahead and check your blood pressure, pulse, oxygen saturation, and your weight (if you have the equipment to check those) and write them all down. When your visit starts, your provider will ask you for this information. If you have an Apple Watch or Kardia device, please plan to have heart rate information ready on the day of your appointment. Please have a pen and paper handy nearby the day of the visit as well."  5. Give patient instructions for MyChart download to smartphone OR [email or web address] as below if video visit (depending on what platform provider is using)  6. Inform patient they will receive a phone call 15 minutes prior to their appointment time (may be from unknown caller ID) so they should be prepared to answer    TELEPHONE CALL NOTE  LEVONTE MOLINA has been deemed a candidate for a follow-up tele-health visit to limit community exposure during the Covid-19 pandemic. I spoke with the patient via phone to ensure availability of phone/video source, confirm preferred email & phone number, and discuss instructions and  expectations.  I reminded [name] to be prepared with any vital sign and/or heart rhythm information that could potentially be obtained via home monitoring, at the time of his visit. I reminded [name] to expect a phone call prior to his visit.  [place] Harriette Ohara 10/17/2018 4:21 PM   INSTRUCTIONS FOR DOWNLOADING THE MYCHART APP TO SMARTPHONE  - The patient must first make sure to have activated MyChart and know their login information - If Apple, go to [employer] and type in MyChart in the search bar and download the app. If Android, ask patient to go to [employer] and type in [place] in the search bar and download the app. The app is free but as with any other app downloads, their phone may require them to verify saved payment information or Apple/Android password.  - The patient will need to then log into the app with their MyChart username and password, and select [facility] as their healthcare provider to link the account. When it is time for your visit, go to the MyChart app, find appointments, and click Begin Video Visit. Be sure to Select Allow for your device to access the Microphone and Camera for your visit. You will then be connected, and your provider will be with you shortly.  **If they have any issues connecting, or need assistance please contact MyChart service desk [phone])**  **If using a computer, in order to ensure the best quality for their visit they will need to use either of the following Internet Browsers: [employer], or Google Chrome**  IF USING DOXIMITY or DOXY.ME - The patient will receive a link just prior to their visit by text.     FULL LENGTH CONSENT FOR TELE-HEALTH VISIT   I hereby voluntarily request, consent and authorize [facility] and its employed or contracted physicians, physician assistants, nurse practitioners or other licensed health care professionals (the Practitioner), to provide me with telemedicine health care services (the Services") as deemed necessary by the treating Practitioner. I acknowledge and consent to receive the Services by the Practitioner via telemedicine. I understand that the telemedicine visit will involve communicating with the Practitioner through live audiovisual communication technology and the disclosure of certain medical information by electronic transmission. I acknowledge that I have been given the opportunity to request an in-person assessment or other available alternative prior to the telemedicine visit and am voluntarily participating in the telemedicine  visit.  I understand that I have the right to withhold or withdraw my consent to the use of telemedicine in the course of my care at any time, without affecting my right to future care or treatment, and that the Practitioner or I may terminate the telemedicine visit at any time. I understand that I have the right to inspect all information obtained and/or recorded in the course of the telemedicine visit and may receive copies of available information for a reasonable fee.  I understand that some of the potential risks of receiving the Services via telemedicine include:   Delay or interruption in medical evaluation due to technological equipment failure or disruption;  Information transmitted may not be sufficient (e.g. poor resolution of images) to allow for appropriate medical decision making by the Practitioner; and/or   In rare instances, security protocols could fail, causing a breach of personal health information.  Furthermore, I acknowledge that it is my responsibility to provide information about my medical history, conditions and care that is complete and accurate to the  best of my ability. I acknowledge that Practitioner's advice, recommendations, and/or decision may be based on factors not within their control, such as incomplete or inaccurate data provided by me or distortions of diagnostic images or specimens that may result from electronic transmissions. I understand that the practice of medicine is not an exact science and that Practitioner makes no warranties or guarantees regarding treatment outcomes. I acknowledge that I will receive a copy of this consent concurrently upon execution via email to the email address I last provided but may also request a printed copy by calling the office of [facility].    I understand that my insurance will be billed for this visit.   I have read or had this consent read to me.  I understand the contents of this consent, which adequately explains  the benefits and risks of the Services being provided via telemedicine.   I have been provided ample opportunity to ask questions regarding this consent and the Services and have had my questions answered to my satisfaction.  I give my informed consent for the services to be provided through the use of telemedicine in my medical care  By participating in this telemedicine visit I agree to the above.

## 2018-10-21 ENCOUNTER — Telehealth (INDEPENDENT_AMBULATORY_CARE_PROVIDER_SITE_OTHER): Payer: Medicare HMO | Admitting: Cardiovascular Disease

## 2018-10-21 ENCOUNTER — Encounter: Payer: Self-pay | Admitting: Cardiovascular Disease

## 2018-10-21 ENCOUNTER — Other Ambulatory Visit: Payer: Self-pay

## 2018-10-21 VITALS — Ht 71.0 in | Wt 160.0 lb

## 2018-10-21 DIAGNOSIS — I10 Essential (primary) hypertension: Secondary | ICD-10-CM

## 2018-10-21 DIAGNOSIS — I6523 Occlusion and stenosis of bilateral carotid arteries: Secondary | ICD-10-CM | POA: Diagnosis not present

## 2018-10-21 DIAGNOSIS — I2510 Atherosclerotic heart disease of native coronary artery without angina pectoris: Secondary | ICD-10-CM

## 2018-10-21 NOTE — Progress Notes (Signed)
Virtual Visit via Telephone Note   This visit type was conducted due to national recommendations for restrictions regarding the COVID-19 Pandemic (e.g. social distancing) in an effort to limit this patient's exposure and mitigate transmission in our community.  Due to his co-morbid illnesses, this patient is at least at moderate risk for complications without adequate follow up.  This format is felt to be most appropriate for this patient at this time.  The patient did not have access to video technology/had technical difficulties with video requiring transitioning to audio format only (telephone).  All issues noted in this document were discussed and addressed.  No physical exam could be performed with this format.  Please refer to the patient's chart for his  consent to telehealth for [facility].   Date:  10/21/2018   ID:  Sean Hartman, DOB [birth year], MRN [id number]  Patient Location: Home Provider Location: Home  PCP:  Dione Housekeeper, MD  Cardiologist:  [name], MD  Electrophysiologist:  None   Evaluation Performed:  Follow-Up Visit  Chief Complaint:  Follow-up CAD/carotid stenosis  History of Present Illness:    Sean Hartman is a 85 y.o. male with coronary artery disease, initially presenting with an anterior MI in 2010 treated with primary PCI of the LAD.  Other comorbid medical conditions include mixed hyperlipidemia, hypertension, type 2 diabetes, and carotid stenosis with chronic occlusion of the left internal carotid artery and moderate stenosis of the right internal carotid artery.  The patient is statin intolerant.  The patient does not have symptoms concerning for COVID-19 infection (fever, chills, cough, or new shortness of breath).   The patient is interviewed via telephone today in light of the COVID-19 pandemic.  He was not able to do video conferencing.  His son is present for the evaluation and discussion.  The patient denies any symptoms of chest  pain, chest pressure, or shortness of breath.  He has had no lightheadedness, syncope, or headache.  He is most bothered by problems with his legs and feet.  States that his feet hurt when he does any walking.  They ask about using coenzyme Q 10 to see if it might help.  He denies typical symptoms of calf claudication.    Past Medical History:  Diagnosis Date  . Anemia    fe deficient  . CAD (coronary artery disease)    status post anterior wall MI November 2010  . Carotid stenosis    with chronic occlusion of LICA and moderate RICA stenosis  . Colon cancer ([place])   . DM2 (diabetes mellitus, type 2) ([place])   . Essential hypertension   . HLD (hyperlipidemia)   . Ischemic cardiomyopathy    Past Surgical History:  Procedure Laterality Date  . [place] VITRECTOMY WITH 20 GAUGE MVR PORT Left 03/20/2017  . 25 GAUGE PARS PLANA VITRECTOMY WITH 20 GAUGE MVR PORT Left 03/20/2017   Procedure: 25 GAUGE PARS PLANA VITRECTOMY WITH 20 GAUGE MVR PORT;  Surgeon: Hayden Pedro, MD;  Location: [facility];  Service: Ophthalmology;  Laterality: Left;  . CATARACT EXTRACTION W/ INTRAOCULAR LENS  IMPLANT, BILATERAL    . COLECTOMY    . CORONARY ANGIOPLASTY WITH STENT PLACEMENT     percutaneous transluminal coronary angioplasty and bare-metal stent to left anterior descending  . INGUINAL HERNIA REPAIR Left      Current Meds  Medication Sig  . amLODipine (NORVASC) 5 MG tablet Take 1 tablet (5 mg total) by mouth daily. Please  keep upcoming appt with Dr. Burt Knack for future refills. Thank you  . aspirin (ASPIR-81) 81 MG EC tablet Take 81 mg by mouth daily.    . carvedilol (COREG) 25 MG tablet Take 12.5 mg by mouth 2 (two) times daily with a meal.  . famotidine (PEPCID) 20 MG tablet Take 2 (20 mg) tablets daily to total 40 mg.  . ferrous sulfate 325 (65 FE) MG tablet Take 1 tablet by mouth every morning.  . furosemide (LASIX) 40 MG tablet Take 1 tablet (40 mg total) by mouth 2 (two) times daily.  [name]  gabapentin (NEURONTIN) 100 MG capsule Take 100 mg by mouth daily.  [name] levothyroxine (SYNTHROID, LEVOTHROID) 100 MCG tablet Take 100 mcg by mouth daily.    [name] losartan (COZAAR) 100 MG tablet Take 1 tablet (100 mg total) by mouth daily.  . metFORMIN (GLUCOPHAGE-XR) 500 MG 24 hr tablet Take 1 tablet by mouth daily.  . nitroGLYCERIN (NITROSTAT) 0.4 MG SL tablet DISSOLVE 1 TABLET UNDER TONGUE EVERY 5 MINUTES AS NEEDED FOR CHEST PAIN UP TO 3 DOSES  . sitaGLIPtin (JANUVIA) 100 MG tablet Take 100 mg by mouth as needed (diabetes).   . SPIRIVA HANDIHALER 18 MCG inhalation capsule Place 18 mcg into inhaler and inhale daily as needed (shortness of breath).      Allergies:   Patient has no known allergies.   Social History   Tobacco Use  . Smoking status: Former Research scientist (life sciences)  . Smokeless tobacco: Never Used  Substance Use Topics  . Alcohol use: No  . Drug use: No     Family Hx: The patient's family history includes Hypertension in his father.  ROS:   Please see the history of present illness.    All other systems reviewed and are negative.   Prior CV studies:   The following studies were reviewed today:  Carotid duplex study 07/26/2018: Summary: Right Carotid: Velocities in the right ICA are consistent with a 1-39% stenosis.                Hemodynamically significant plaque >50% visualized in the CCA.                The ECA appears >50% stenosed.  Left Carotid: Evidence consistent with a total occlusion of the left ICA.               Chronically occluded LICA with trickle flow proximally and               reconstitution of flow distally.  Vertebrals:  Bilateral vertebral arteries demonstrate antegrade flow. Subclavians: Bilateral subclavian artery flow was disturbed.  *See table(s) above for measurements and observations. Suggest follow up study in 12 months.  Labs/Other Tests and Data Reviewed:    EKG:  An ECG dated 10/03/2017 was personally reviewed today and demonstrated:  Normal  sinus rhythm, first-degree AV block, within normal limits  Recent Labs: No results found for requested labs within last 8760 hours.   Recent Lipid Panel Lab Results  Component Value Date/Time   CHOL (H) 04/15/2009 03:20 AM    212        ATP III CLASSIFICATION:  <200     mg/dL   Desirable  200-239  mg/dL   Borderline High  >=240    mg/dL   High          TRIG 79 04/15/2009 03:20 AM   HDL 38 (L) 04/15/2009 03:20 AM   CHOLHDL 5.6 04/15/2009 03:20 AM   LDLCALC (H)  04/15/2009 03:20 AM    158        Total Cholesterol/HDL:CHD Risk Coronary Heart Disease Risk Table                     Men   Women  1/2 Average Risk   3.4   3.3  Average Risk       5.0   4.4  2 X Average Risk   9.6   7.1  3 X Average Risk  23.4   11.0        Use the calculated Patient Ratio above and the CHD Risk Table to determine the patient's CHD Risk.        ATP III CLASSIFICATION (LDL):  <100     mg/dL   Optimal  100-129  mg/dL   Near or Above                    Optimal  130-159  mg/dL   Borderline  160-189  mg/dL   High  >190     mg/dL   Very High    Wt Readings from Last 3 Encounters:  10/21/18 160 lb (72.6 kg)  10/03/17 162 lb 1.9 oz (73.5 kg)  03/20/17 160 lb (72.6 kg)     Objective:    Vital Signs:  Ht 5\' 11"  (1.803 m)   Wt 160 lb (72.6 kg)   BMI 22.32 kg/m    VITAL SIGNS:  reviewed  The patient is alert, oriented, in no distress. Remaining exam not performed today as this is a virtual/telephone visit in light of the COVID-19 pandemic.  ASSESSMENT & PLAN:    1. Coronary artery disease, native vessel, without angina: Patient remains chest pain-free and will plan to continue his current medical program without change. 2. Hypertension: Blood pressure has been well controlled on his medical program. 3. Chronic diastolic heart failure: The patient has been euvolemic on past exams.  He continues on a loop diuretic in the context of chronic kidney disease. States that his edema is well controlled  on his current diuretic dosage. 4. Carotid stenosis without history of stroke: Recent duplex reviewed with the patient.  He will continue on his current medical program.  COVID-19 Education: The signs and symptoms of COVID-19 were discussed with the patient and how to seek care for testing (follow up with PCP or arrange E-visit).  The importance of social distancing was discussed today.  Time:   Today, I have spent 16 minutes with the patient with telehealth technology discussing the above problems.     Medication Adjustments/Labs and Tests Ordered: Current medicines are reviewed at length with the patient today.  Concerns regarding medicines are outlined above.   Tests Ordered: No orders of the defined types were placed in this encounter.   Medication Changes: No orders of the defined types were placed in this encounter.   Disposition:  Follow up in 1 year(s)  Signed, [name], MD  10/21/2018 3:43 PM    [facility]

## 2018-11-11 ENCOUNTER — Other Ambulatory Visit: Payer: Self-pay | Admitting: Cardiovascular Disease

## 2018-11-11 MED ORDER — LOSARTAN POTASSIUM 100 MG PO TABS: 100.0000 mg | ORAL_TABLET | Freq: Every day | ORAL | 3 refills | Status: DC

## 2018-12-09 ENCOUNTER — Other Ambulatory Visit: Payer: Self-pay | Admitting: Cardiovascular Disease

## 2019-01-01 ENCOUNTER — Other Ambulatory Visit: Payer: Self-pay

## 2019-01-02 ENCOUNTER — Encounter: Payer: Self-pay | Admitting: Physician Assistant

## 2019-01-02 ENCOUNTER — Ambulatory Visit (INDEPENDENT_AMBULATORY_CARE_PROVIDER_SITE_OTHER): Payer: Medicare HMO | Admitting: Physician Assistant

## 2019-01-02 VITALS — BP 134/68 | HR 74 | Temp 98.0°F | Ht 70.0 in | Wt 161.0 lb

## 2019-01-02 DIAGNOSIS — E039 Hypothyroidism, unspecified: Secondary | ICD-10-CM

## 2019-01-02 DIAGNOSIS — I2510 Atherosclerotic heart disease of native coronary artery without angina pectoris: Secondary | ICD-10-CM

## 2019-01-02 DIAGNOSIS — E119 Type 2 diabetes mellitus without complications: Secondary | ICD-10-CM

## 2019-01-02 LAB — BAYER DCA HB A1C WAIVED: HB A1C (BAYER DCA - WAIVED): 7.6 % — ABNORMAL HIGH (ref <7.0)

## 2019-01-02 NOTE — Progress Notes (Signed)
  Subjective:     Patient ID: Sean Hartman, male   DOB: 03/31/1923, 85 y.o.   MRN: [id number]  HPI Pt here to establish care Prev pt of Dr Edrick Oh for years Needing rf on thyroid meds Sig PMH of CAD, NIDDM, and colon Ca He is seen by Cardiology - Dr Burt Knack and Oncology yearly Pt also relates hx of assoc SOB during the hot and humid months No definite dx of COPD and only uses Spriva intermit No concerns at this time  Review of Systems  Constitutional: Negative.   HENT: Negative.   Respiratory: Negative.        Only with SOB outside during the heat and humidity of Summer  No issues when in the cooler air of indoors  Cardiovascular: Negative.   Endocrine: Negative.        Objective:   Physical Exam Vitals signs and nursing note reviewed.  Constitutional:      General: He is not in acute distress.    Appearance: Normal appearance. He is not ill-appearing.  Neck:     Vascular: No carotid bruit.  Cardiovascular:     Rate and Rhythm: Normal rate and regular rhythm.     Pulses: Normal pulses.     Heart sounds: Normal heart sounds.  Pulmonary:     Effort: Pulmonary effort is normal. No respiratory distress.     Breath sounds: Normal breath sounds. No wheezing.  Musculoskeletal:     Right lower leg: Edema present.     Left lower leg: Edema present.     Comments: Trace edema with L>R  Lymphadenopathy:     Cervical: No cervical adenopathy.  Neurological:     Mental Status: He is alert.  Psychiatric:        Mood and Affect: Mood normal.        Behavior: Behavior normal.        Thought Content: Thought content normal.        Assessment:     1. Diabetes mellitus without complication ([place])   2. Atherosclerosis of coronary artery of native heart without angina pectoris, unspecified vessel or lesion type   3. Hypothyroidism, unspecified type        Plan:     A1C sl elevated today Pt states he does not follow the best diet Would like for him to do better in regards  to diet and hold med changes at this time BMP,Lipid, and thyroid studies pending Will inform of results Continue regular f/u with Cardiology and Oncology Recheck in 3 months sooner if any problems

## 2019-01-02 NOTE — Patient Instructions (Signed)

## 2019-01-02 NOTE — Progress Notes (Signed)
Bmp

## 2019-01-03 ENCOUNTER — Other Ambulatory Visit: Payer: Self-pay | Admitting: Cardiovascular Disease

## 2019-01-03 LAB — LIPID PANEL
Chol/HDL Ratio: 5.5 ratio — ABNORMAL HIGH (ref 0.0–5.0)
Cholesterol, Total: 216 mg/dL — ABNORMAL HIGH (ref 100–199)
HDL: 39 mg/dL — ABNORMAL LOW (ref >39)
LDL Calculated: 135 mg/dL — ABNORMAL HIGH (ref 0–99)
Triglycerides: 211 mg/dL — ABNORMAL HIGH (ref 0–149)
VLDL Cholesterol Cal: 42 mg/dL — ABNORMAL HIGH (ref 5–40)

## 2019-01-03 LAB — TSH: TSH: 1.44 u[IU]/mL (ref 0.450–4.500)

## 2019-01-03 LAB — BMP8+EGFR
BUN/Creatinine Ratio: 14 (ref 10–24)
BUN: 15 mg/dL (ref 10–36)
CO2: 26 mmol/L (ref 20–29)
Calcium: 9.7 mg/dL (ref 8.6–10.2)
Chloride: 97 mmol/L (ref 96–106)
Creatinine, Ser: 1.07 mg/dL (ref 0.76–1.27)
GFR calc Af Amer: 69 mL/min/{1.73_m2} (ref >59)
GFR calc non Af Amer: 60 mL/min/{1.73_m2} (ref >59)
Glucose: 176 mg/dL — ABNORMAL HIGH (ref 65–99)
Potassium: 4.9 mmol/L (ref 3.5–5.2)
Sodium: 137 mmol/L (ref 134–144)

## 2019-01-03 MED ORDER — CARVEDILOL 25 MG PO TABS: 12.5000 mg | ORAL_TABLET | Freq: Two times a day (BID) | ORAL | 3 refills | Status: DC

## 2019-01-07 ENCOUNTER — Ambulatory Visit: Payer: Medicare HMO | Admitting: Family Medicine

## 2019-01-13 ENCOUNTER — Other Ambulatory Visit: Payer: Self-pay | Admitting: Cardiovascular Disease

## 2019-02-07 ENCOUNTER — Ambulatory Visit (INDEPENDENT_AMBULATORY_CARE_PROVIDER_SITE_OTHER): Payer: Medicare HMO

## 2019-02-07 ENCOUNTER — Ambulatory Visit: Payer: Medicare HMO | Admitting: Family

## 2019-02-07 ENCOUNTER — Other Ambulatory Visit: Payer: Self-pay

## 2019-02-07 ENCOUNTER — Ambulatory Visit (INDEPENDENT_AMBULATORY_CARE_PROVIDER_SITE_OTHER): Payer: Medicare HMO | Admitting: Family

## 2019-02-07 ENCOUNTER — Encounter: Payer: Self-pay | Admitting: Family

## 2019-02-07 ENCOUNTER — Telehealth: Payer: Self-pay | Admitting: Cardiovascular Disease

## 2019-02-07 VITALS — BP 160/79 | HR 73 | Temp 96.2°F | Ht 70.0 in | Wt 160.0 lb

## 2019-02-07 DIAGNOSIS — R0602 Shortness of breath: Secondary | ICD-10-CM | POA: Diagnosis not present

## 2019-02-07 DIAGNOSIS — R42 Dizziness and giddiness: Secondary | ICD-10-CM

## 2019-02-07 DIAGNOSIS — I951 Orthostatic hypotension: Secondary | ICD-10-CM

## 2019-02-07 LAB — HEMOGLOBIN, FINGERSTICK: Hemoglobin: 13 g/dL (ref 12.6–17.7)

## 2019-02-07 NOTE — Telephone Encounter (Signed)
Spoke with pt's son and pt was seen today by PCP and was noted to have orthostatic hypotension. Per pt's son  pt was sitting B/P was 160/74 and upon standing B/P dropped to 106/54 and has noted some dizziness Instructed to have pt not to rush with changing positions but allow few moments between changes and also encouraged to check B/P over the weekend about the same time every day and bring readings to appt on Tuesday with  Pecolia Ades NP. Will forward message to Dr Johnsie Cancel for review .Adonis Housekeeper

## 2019-02-07 NOTE — Telephone Encounter (Signed)
New Message   Patients son is calling on his behalf because the patient has been experiencing some dizziness. An appt has been made for 9/08 with Pecolia Ades at the advisement of the patients PCP  STAT if patient feels like he/she is going to faint   1) Are you dizzy now? no  2) Do you feel faint or have you passed out? no  3) Do you have any other symptoms? Patient son states that upon standing BP drops and the patient will stumble and sometimes fall.   4) Have you checked your HR and BP (record if available)? BP 160/74 (Sitting) Standing 106/54

## 2019-02-07 NOTE — Progress Notes (Signed)
Subjective:    Patient ID: Sean Hartman, male    DOB: [birth year], 85 y.o.   MRN: [id number]  Chief Complaint  Patient presents with  . Dizziness    Dizziness This is a new problem. The current episode started in the past 7 days. The problem has been unchanged. Pertinent negatives include no chills, congestion, coughing, headaches, joint swelling, sore throat, swollen glands or urinary symptoms. Associated symptoms comments: SOB. The symptoms are aggravated by standing. He has tried rest for the symptoms. The treatment provided mild relief.      Review of Systems  Constitutional: Negative for chills.  HENT: Negative for congestion and sore throat.   Respiratory: Negative for cough.   Musculoskeletal: Negative for joint swelling.  Neurological: Positive for dizziness. Negative for headaches.  All other systems reviewed and are negative.      Objective:   Physical Exam Vitals signs reviewed.  Constitutional:      General: He is not in acute distress.    Appearance: He is well-developed.  HENT:     Head: Normocephalic.  Eyes:     General:        Right eye: No discharge.        Left eye: No discharge.     Pupils: Pupils are equal, round, and reactive to light.  Neck:     Musculoskeletal: Normal range of motion and neck supple.     Thyroid: No thyromegaly.  Cardiovascular:     Rate and Rhythm: Normal rate and regular rhythm.     Heart sounds: Normal heart sounds. No murmur.  Pulmonary:     Effort: Pulmonary effort is normal. No respiratory distress.     Breath sounds: Normal breath sounds. No wheezing.     Comments: Intermittent coarse nonproductive  Abdominal:     General: Bowel sounds are normal. There is no distension.     Palpations: Abdomen is soft.     Tenderness: There is no abdominal tenderness.  Musculoskeletal: Normal range of motion.        General: No tenderness.     Right lower leg: Edema (trace) present.     Left lower leg: Edema (trace) present.   Skin:    General: Skin is warm and dry.     Coloration: Skin is pale.     Findings: No erythema or rash.  Neurological:     Mental Status: He is alert and oriented to person, place, and time.     Cranial Nerves: No cranial nerve deficit.     Deep Tendon Reflexes: Reflexes are normal and symmetric.  Psychiatric:        Behavior: Behavior normal.        Thought Content: Thought content normal.        Judgment: Judgment normal.       BP (!) 160/79   Pulse 73   Temp (!) 96.2 F (35.7 C) (Temporal)   Ht 5' 10" (1.778 m)   Wt 160 lb (72.6 kg)   SpO2 93% Comment: sitting at room air  BMI 22.96 kg/m      Assessment & Plan:  Sean Hartman comes in today with chief complaint of Dizziness   Diagnosis and orders addressed:  1. Dizziness - Hemoglobin, fingerstick - CMP14+EGFR - Anemia Profile B  2. SOB (shortness of breath) - Hemoglobin, fingerstick - CMP14+EGFR - Anemia Profile B - DG Chest 2 View; Future    3. Orthostatic hypotension   Labs pending Discussed standing up  slowing and avoid fast position changes He will also start taking his lasix40 mg  every other day vs Lasix 80 mg every third day Stay hydrated He will call and make Cardiologists follow up RTO if symptoms worsen or do not improve   Evelina Dun, FNP

## 2019-02-07 NOTE — Patient Instructions (Signed)
Orthostatic Hypotension °Blood pressure is a measurement of how strongly, or weakly, your blood is pressing against the walls of your arteries. Orthostatic hypotension is a sudden drop in blood pressure that happens when you quickly change positions, such as when you get up from sitting or lying down. °Arteries are blood vessels that carry blood from your heart throughout your body. When blood pressure is too low, you may not get enough blood to your brain or to the rest of your organs. This can cause weakness, light-headedness, rapid heartbeat, and fainting. This can last for just a few seconds or for up to a few minutes. Orthostatic hypotension is usually not a serious problem. However, if it happens frequently or gets worse, it may be a sign of something more serious. °What are the causes? °This condition may be caused by: °· Sudden changes in posture, such as standing up quickly after you have been sitting or lying down. °· Blood loss. °· Loss of body fluids (dehydration). °· Heart problems. °· Hormone (endocrine) problems. °· Pregnancy. °· Severe infection. °· Lack of certain nutrients. °· Severe allergic reactions (anaphylaxis). °· Certain medicines, such as blood pressure medicine or medicines that make the body lose excess fluids (diuretics). Sometimes, this condition can be caused by not taking medicine as directed, such as taking too much of a certain medicine. °What increases the risk? °The following factors may make you more likely to develop this condition: °· Age. Risk increases as you get older. °· Conditions that affect the heart or the central nervous system. °· Taking certain medicines, such as blood pressure medicine or diuretics. °· Being pregnant. °What are the signs or symptoms? °Symptoms of this condition may include: °· Weakness. °· Light-headedness. °· Dizziness. °· Blurred vision. °· Fatigue. °· Rapid heartbeat. °· Fainting, in severe cases. °How is this diagnosed? °This condition is  diagnosed based on: °· Your medical history. °· Your symptoms. °· Your blood pressure measurement. Your health care provider will check your blood pressure when you are: °? Lying down. °? Sitting. °? Standing. °A blood pressure reading is recorded as two numbers, such as "120 over 80" (or 120/80). The first ("top") number is called the systolic pressure. It is a measure of the pressure in your arteries as your heart beats. The second ("bottom") number is called the diastolic pressure. It is a measure of the pressure in your arteries when your heart relaxes between beats. Blood pressure is measured in a unit called mm Hg. Healthy blood pressure for most adults is 120/80. If your blood pressure is below 90/60, you may be diagnosed with hypotension. °Other information or tests that may be used to diagnose orthostatic hypotension include: °· Your other vital signs, such as your heart rate and temperature. °· Blood tests. °· Tilt table test. For this test, you will be safely secured to a table that moves you from a lying position to an upright position. Your heart rhythm and blood pressure will be monitored during the test. °How is this treated? °This condition may be treated by: °· Changing your diet. This may involve eating more salt (sodium) or drinking more water. °· Taking medicines to raise your blood pressure. °· Changing the dosage of certain medicines you are taking that might be lowering your blood pressure. °· Wearing compression stockings. These stockings help to prevent blood clots and reduce swelling in your legs. °In some cases, you may need to go to the hospital for: °· Fluid replacement. This means you will   receive fluids through an IV. °· Blood replacement. This means you will receive donated blood through an IV (transfusion). °· Treating an infection or heart problems, if this applies. °· Monitoring. You may need to be monitored while medicines that you are taking wear off. °Follow these instructions  at home: °Eating and drinking ° °· Drink enough fluid to keep your urine pale yellow. °· Eat a healthy diet, and follow instructions from your health care provider about eating or drinking restrictions. A healthy diet includes: °? Fresh fruits and vegetables. °? Whole grains. °? Lean meats. °? Low-fat dairy products. °· Eat extra salt only as directed. Do not add extra salt to your diet unless your health care provider told you to do that. °· Eat frequent, small meals. °· Avoid standing up suddenly after eating. °Medicines °· Take over-the-counter and prescription medicines only as told by your health care provider. °? Follow instructions from your health care provider about changing the dosage of your current medicines, if this applies. °? Do not stop or adjust any of your medicines on your own. °General instructions ° °· Wear compression stockings as told by your health care provider. °· Get up slowly from lying down or sitting positions. This gives your blood pressure a chance to adjust. °· Avoid hot showers and excessive heat as directed by your health care provider. °· Return to your normal activities as told by your health care provider. Ask your health care provider what activities are safe for you. °· Do not use any products that contain nicotine or tobacco, such as cigarettes, e-cigarettes, and chewing tobacco. If you need help quitting, ask your health care provider. °· Keep all follow-up visits as told by your health care provider. This is important. °Contact a health care provider if you: °· Vomit. °· Have diarrhea. °· Have a fever for more than 2-3 days. °· Feel more thirsty than usual. °· Feel weak and tired. °Get help right away if you: °· Have chest pain. °· Have a fast or irregular heartbeat. °· Develop numbness in any part of your body. °· Cannot move your arms or your legs. °· Have trouble speaking. °· Become sweaty or feel light-headed. °· Faint. °· Feel short of breath. °· Have trouble staying  awake. °· Feel confused. °Summary °· Orthostatic hypotension is a sudden drop in blood pressure that happens when you quickly change positions. °· Orthostatic hypotension is usually not a serious problem. °· It is diagnosed by having your blood pressure taken lying down, sitting, and then standing. °· It may be treated by changing your diet or adjusting your medicines. °This information is not intended to replace advice given to you by your health care provider. Make sure you discuss any questions you have with your health care provider. °Document Released: 05/12/2002 Document Revised: 11/15/2017 Document Reviewed: 11/15/2017 °Elsevier Patient Education © 2020 [employer] ° °

## 2019-02-08 LAB — ANEMIA PROFILE B
Basophils Absolute: 0 10*3/uL (ref 0.0–0.2)
Basos: 1 %
EOS (ABSOLUTE): 0.1 10*3/uL (ref 0.0–0.4)
Eos: 1 %
Ferritin: 60 ng/mL (ref 30–400)
Folate: >20 ng/mL (ref >3.0)
Hematocrit: 39 % (ref 37.5–51.0)
Hemoglobin: 13.3 g/dL (ref 13.0–17.7)
Immature Grans (Abs): 0 10*3/uL (ref 0.0–0.1)
Immature Granulocytes: 0 %
Iron Saturation: 18 % (ref 15–55)
Iron: 56 ug/dL (ref 38–169)
Lymphocytes Absolute: 0.7 10*3/uL (ref 0.7–3.1)
Lymphs: 11 %
MCH: 31.2 pg (ref 26.6–33.0)
MCHC: 34.1 g/dL (ref 31.5–35.7)
MCV: 92 fL (ref 79–97)
Monocytes Absolute: 0.5 10*3/uL (ref 0.1–0.9)
Monocytes: 8 %
Neutrophils Absolute: 5.1 10*3/uL (ref 1.4–7.0)
Neutrophils: 79 %
Platelets: 216 10*3/uL (ref 150–450)
RBC: 4.26 x10E6/uL (ref 4.14–5.80)
RDW: 13.5 % (ref 11.6–15.4)
Retic Ct Pct: 1.3 % (ref 0.6–2.6)
Total Iron Binding Capacity: 314 ug/dL (ref 250–450)
UIBC: 258 ug/dL (ref 111–343)
Vitamin B-12: 508 pg/mL (ref 232–1245)
WBC: 6.5 10*3/uL (ref 3.4–10.8)

## 2019-02-08 LAB — CMP14+EGFR
ALT: 12 IU/L (ref 0–44)
AST: 18 IU/L (ref 0–40)
Albumin/Globulin Ratio: 1.6 (ref 1.2–2.2)
Albumin: 4.2 g/dL (ref 3.5–4.6)
Alkaline Phosphatase: 65 IU/L (ref 39–117)
BUN/Creatinine Ratio: 15 (ref 10–24)
BUN: 18 mg/dL (ref 10–36)
Bilirubin Total: 0.6 mg/dL (ref 0.0–1.2)
CO2: 23 mmol/L (ref 20–29)
Calcium: 9.6 mg/dL (ref 8.6–10.2)
Chloride: 100 mmol/L (ref 96–106)
Creatinine, Ser: 1.2 mg/dL (ref 0.76–1.27)
GFR calc Af Amer: 60 mL/min/{1.73_m2} (ref >59)
GFR calc non Af Amer: 52 mL/min/{1.73_m2} — ABNORMAL LOW (ref >59)
Globulin, Total: 2.6 g/dL (ref 1.5–4.5)
Glucose: 171 mg/dL — ABNORMAL HIGH (ref 65–99)
Potassium: 4.2 mmol/L (ref 3.5–5.2)
Sodium: 136 mmol/L (ref 134–144)
Total Protein: 6.8 g/dL (ref 6.0–8.5)

## 2019-02-11 ENCOUNTER — Encounter: Payer: Self-pay | Admitting: Cardiology

## 2019-02-11 ENCOUNTER — Other Ambulatory Visit: Payer: Self-pay

## 2019-02-11 ENCOUNTER — Ambulatory Visit (INDEPENDENT_AMBULATORY_CARE_PROVIDER_SITE_OTHER): Payer: Medicare HMO | Admitting: Cardiology

## 2019-02-11 VITALS — BP 122/50 | HR 51 | Ht 70.0 in | Wt 162.8 lb

## 2019-02-11 DIAGNOSIS — E785 Hyperlipidemia, unspecified: Secondary | ICD-10-CM

## 2019-02-11 DIAGNOSIS — I25118 Atherosclerotic heart disease of native coronary artery with other forms of angina pectoris: Secondary | ICD-10-CM | POA: Diagnosis not present

## 2019-02-11 DIAGNOSIS — I10 Essential (primary) hypertension: Secondary | ICD-10-CM

## 2019-02-11 DIAGNOSIS — R42 Dizziness and giddiness: Secondary | ICD-10-CM

## 2019-02-11 DIAGNOSIS — I739 Peripheral vascular disease, unspecified: Secondary | ICD-10-CM

## 2019-02-11 DIAGNOSIS — I779 Disorder of arteries and arterioles, unspecified: Secondary | ICD-10-CM

## 2019-02-11 DIAGNOSIS — I5032 Chronic diastolic (congestive) heart failure: Secondary | ICD-10-CM

## 2019-02-11 MED ORDER — CARVEDILOL 6.25 MG PO TABS: 12.5000 mg | ORAL_TABLET | Freq: Two times a day (BID) | ORAL | 3 refills | Status: DC

## 2019-02-11 MED ORDER — CARVEDILOL 6.25 MG PO TABS: 6.2500 mg | ORAL_TABLET | Freq: Two times a day (BID) | ORAL | 3 refills | Status: DC

## 2019-02-11 MED ORDER — NITROGLYCERIN 0.4 MG SL SUBL: SUBLINGUAL_TABLET | SUBLINGUAL | 3 refills | Status: DC

## 2019-02-11 MED ORDER — FUROSEMIDE 40 MG PO TABS: 40.0000 mg | ORAL_TABLET | ORAL | 3 refills | Status: DC | PRN

## 2019-02-11 NOTE — Patient Instructions (Addendum)
Medication Instructions:  DECREASE: Carvedilol to 6.25 twice a day   DECREASE: Lasix to only as needed for increased weight gain or swelling   If you need a refill on your cardiac medications before your next appointment, please call your pharmacy.   Lab work: None   If you have labs (blood work) drawn today and your tests are completely normal, you will receive your results only by: [name] MyChart Message (if you have MyChart) OR . A paper copy in the mail If you have any lab test that is abnormal or we need to change your treatment, we will call you to review the results.  Testing/Procedures: None   Follow-Up: At [facility], you and your health needs are our priority.  As part of our continuing mission to provide you with exceptional heart care, we have created designated Provider Care Teams.  These Care Teams include your primary Cardiologist (physician) and Advanced Practice Providers (APPs -  Physician Assistants and Nurse Practitioners) who all work together to provide you with the care you need, when you need it. You will need a follow up appointment in:  3-4 months.  Please call our office 2 months in advance to schedule this appointment.  You may see [name], MD or one of the following Advanced Practice Providers on your designated Care Team: Richardson Dopp, PA-C [place], [place] . Daune Perch, NP  Any Other Special Instructions Will Be Listed Below (If Applicable).   Do the following things EVERY DAY:   1. Weigh yourself EVERY morning after you go to the bathroom but before you eat or drink anything. Write this number down in a weight log/diary. If you gain 3 pounds overnight or 5 pounds in a week or you have increased swelling, take a dose of lasix. If weight does not improve, call the office.   2. Take your medicines as prescribed. If you have concerns about your medications, please call us before you stop taking them.    3. Eat low salt foods-Limit salt (sodium)  to 2000 mg per day. This will help prevent your body from holding onto fluid. Read food labels as many processed foods have a lot of sodium, especially canned goods and prepackaged meats. If you would like some assistance choosing low sodium foods, we would be happy to set you up with a nutritionist.   4. Stay as active as you can everyday. Staying active will give you more energy and make your muscles stronger. Start with 5 minutes at a time and work your way up to 30 minutes a day. Break up your activities--do some in the morning and some in the afternoon. Start with 3 days per week and work your way up to 5 days as you can.  If you have chest pain, feel short of breath, dizzy, or lightheaded, STOP. If you don't feel better after a short rest, call 911. If you do feel better, call the office to let us know you have symptoms with exercise.   5. Limit all fluids for the day to less than 2 liters. Fluid includes all drinks, coffee, juice, ice chips, soup, jello, and all other liquids.   =================================================   Dizziness Dizziness is a common problem. It makes you feel unsteady or light-headed. You may feel like you are about to pass out (faint). Dizziness can lead to getting hurt if you stumble or fall. Dizziness can be caused by many things, including:  Medicines.  Not having enough water in your  body (dehydration).  Illness. Follow these instructions at home: Eating and drinking   Drink enough fluid to keep your pee (urine) clear or pale yellow. This helps to keep you from getting dehydrated. Try to drink more clear fluids, such as water.  Do not drink alcohol.  Limit how much caffeine you drink or eat, if your doctor tells you to do that.  Limit how much salt (sodium) you drink or eat, if your doctor tells you to do that. Activity   Avoid making quick movements. ? When you stand up from sitting in a chair, steady yourself until you feel okay. ? In the  morning, first sit up on the side of the bed. When you feel okay, stand slowly while you hold onto something. Do this until you know that your balance is fine.  If you need to stand in one place for a long time, move your legs often. Tighten and relax the muscles in your legs while you are standing.  Do not drive or use heavy machinery if you feel dizzy.  Avoid bending down if you feel dizzy. Place items in your home so you can reach them easily without leaning over. Lifestyle  Do not use any products that contain nicotine or tobacco, such as cigarettes and e-cigarettes. If you need help quitting, ask your doctor.  Try to lower your stress level. You can do this by using methods such as yoga or meditation. Talk with your doctor if you need help. General instructions  Watch your dizziness for any changes.  Take over-the-counter and prescription medicines only as told by your doctor. Talk with your doctor if you think that you are dizzy because of a medicine that you are taking.  Tell a friend or a family member that you are feeling dizzy. If he or she notices any changes in your behavior, have this person call your doctor.  Keep all follow-up visits as told by your doctor. This is important. Contact a doctor if:  Your dizziness does not go away.  Your dizziness or light-headedness gets worse.  You feel sick to your stomach (nauseous).  You have trouble hearing.  You have new symptoms.  You are unsteady on your feet.  You feel like the room is spinning. Get help right away if:  You throw up (vomit) or have watery poop (diarrhea), and you cannot eat or drink anything.  You have trouble: ? Talking. ? Walking. ? Swallowing. ? Using your arms, hands, or legs.  You feel generally weak.  You are not thinking clearly, or you have trouble forming sentences. A friend or family member may notice this.  You have: ? Chest pain. ? Pain in your belly (abdomen). ? Shortness of  breath. ? Sweating.  Your vision changes.  You are bleeding.  You have a very bad headache.  You have neck pain or a stiff neck.  You have a fever. These symptoms may be an emergency. Do not wait to see if the symptoms will go away. Get medical help right away. Call your local emergency services (911 in the [place] Do not drive yourself to the hospital. Summary  Dizziness makes you feel unsteady or light-headed. You may feel like you are about to pass out (faint).  Drink enough fluid to keep your pee (urine) clear or pale yellow. Do not drink alcohol.  Avoid making quick movements if you feel dizzy.  Watch your dizziness for any changes. This information is not intended to replace advice  given to you by your health care provider. Make sure you discuss any questions you have with your health care provider. Document Released: 05/11/2011 Document Revised: 05/25/2017 Document Reviewed: 06/08/2016 Elsevier Patient Education  2020 [employer].

## 2019-02-11 NOTE — Progress Notes (Signed)
Cardiology Office Note:    Date:  02/11/2019   ID:  Sean Hartman, DOB 04-27-1923, MRN [id number]  PCP:  Claretta Fraise, MD  Cardiologist:  [name], MD  Referring MD: Claretta Fraise, MD   Chief Complaint  Patient presents with  . Dizziness    History of Present Illness:    Sean Hartman is a 85 y.o. male with a past medical history significant for CAD s/p anterior MI 2010 with PCI to the LAD.  He also has history of mixed hyperlipidemia, hypertension, type 2 diabetes and carotid artery stenosis with chronic occlusion of the left internal carotid artery and moderate stenosis of the right internal carotid artery.  The patient is noted to be statin intolerant.  The patient was seen by her PCP on 02/07/2019 for dizziness and shortness of breath.  She was found to be orthostatic with blood pressure supine 112/73, standing 106/59.  Heart rate did not change much.  Her diuretic therapy was changed from Lasix 80 mg every third day to Lasix 40 mg every other day.  The patient was advised to stay hydrated and change positions slowly and carefully.  CBC was checked with hemoglobin of 13.3.  Vitamin B12 level was 508.  Creatinine was 1.20, potassium 4.2.  The patient was last seen by Dr. Burt Knack via telemedicine on 10/21/2018 at which time the patient was most bothered by problems with his legs and feet.  He is here today with his son. He reports that he has had dizziness, when first standing up. It makes his head feel heavy and he is afraid to drive. About a month ago he got up in the middle of the night to use the bathroom, in a hurry and he got dizzy and fell into the wall. His son notes that the dizziness started just after he began to take a new Rx of carvedilol with which he is cutting a 25 mg pill in half. Upon inspection of the pills they are not scored.   His son says that he has not taken any lasix since Friday, 4 days ago. He is not having any swelling. He has DOE with walking in  from the parking lot. This has been his baseline for almost a year. He eats lots of [employer], buscuits and gravy. His son says he is 72 and he wants him to be able to eat what he wants. He drinks a lot of water per his son, at least 6-8 cups per day. (about 3 gallons per week) His son feels that the patient is doing a little better in regards to dizziness while off the Lasix.  He feels that the patient should take Lasix only as needed.  He has occ chest tighthenss when he gets upset, other times none.   Past Medical History:  Diagnosis Date  . Anemia    fe deficient  . CAD (coronary artery disease)    status post anterior wall MI November 2010  . Carotid stenosis    with chronic occlusion of LICA and moderate RICA stenosis  . Colon cancer ([place])   . DM2 (diabetes mellitus, type 2) ([place])   . Essential hypertension   . HLD (hyperlipidemia)   . Ischemic cardiomyopathy     Past Surgical History:  Procedure Laterality Date  . [place] VITRECTOMY WITH 20 GAUGE MVR PORT Left 03/20/2017  . 25 GAUGE PARS PLANA VITRECTOMY WITH 20 GAUGE MVR PORT Left 03/20/2017   Procedure: 25 GAUGE PARS PLANA  VITRECTOMY WITH 20 GAUGE MVR PORT;  Surgeon: Hayden Pedro, MD;  Location: [facility];  Service: Ophthalmology;  Laterality: Left;  . CATARACT EXTRACTION W/ INTRAOCULAR LENS  IMPLANT, BILATERAL    . COLECTOMY    . CORONARY ANGIOPLASTY WITH STENT PLACEMENT     percutaneous transluminal coronary angioplasty and bare-metal stent to left anterior descending  . INGUINAL HERNIA REPAIR Left     Current Medications: Current Meds  Medication Sig  . amLODipine (NORVASC) 5 MG tablet TAKE 1 TABLET BY MOUTH ONCE DAILY. PLEASE KEEP UPCOMING APPOINTMENT WITH DR. Burt Knack FOR FUTURE REFILLS.  [name] aspirin (ASPIR-81) 81 MG EC tablet Take 81 mg by mouth daily.    . carvedilol (COREG) 6.25 MG tablet Take 1 tablet (6.25 mg total) by mouth 2 (two) times daily with a meal.  . famotidine (PEPCID) 20 MG tablet Take 2  tablets by mouth once daily  . furosemide (LASIX) 40 MG tablet Take 1 tablet (40 mg total) by mouth as needed (increased weight gain or swelling).  [name] levothyroxine (SYNTHROID, LEVOTHROID) 100 MCG tablet Take 100 mcg by mouth daily.    [name] losartan (COZAAR) 100 MG tablet Take 1 tablet (100 mg total) by mouth daily.  . nitroGLYCERIN (NITROSTAT) 0.4 MG SL tablet DISSOLVE 1 TABLET UNDER TONGUE EVERY 5 MINUTES AS NEEDED FOR CHEST PAIN UP TO 3 DOSES  . sitaGLIPtin (JANUVIA) 100 MG tablet Take 100 mg by mouth as needed (diabetes).   . [DISCONTINUED] carvedilol (COREG) 25 MG tablet Take 0.5 tablets (12.5 mg total) by mouth 2 (two) times daily with a meal.  . [DISCONTINUED] carvedilol (COREG) 6.25 MG tablet Take 2 tablets (12.5 mg total) by mouth 2 (two) times daily with a meal.  . [DISCONTINUED] furosemide (LASIX) 40 MG tablet Take 1 tablet (40 mg total) by mouth 2 (two) times daily.  . [DISCONTINUED] nitroGLYCERIN (NITROSTAT) 0.4 MG SL tablet DISSOLVE 1 TABLET UNDER TONGUE EVERY 5 MINUTES AS NEEDED FOR CHEST PAIN UP TO 3 DOSES     Allergies:   Patient has no known allergies.   Social History   Socioeconomic History  . Marital status: Widowed    Spouse name: Not on file  . Number of children: Not on file  . Years of education: Not on file  . Highest education level: Not on file  Occupational History  . Not on file  Social Needs  . Financial resource strain: Not on file  . Food insecurity    Worry: Not on file    Inability: Not on file  . Transportation needs    Medical: Not on file    Non-medical: Not on file  Tobacco Use  . Smoking status: Former Smoker    Years: 15.00    Quit date: 09/23/1998    Years since quitting: 20.4  . Smokeless tobacco: Never Used  Substance and Sexual Activity  . Alcohol use: No  . Drug use: No  . Sexual activity: Not on file  Lifestyle  . Physical activity    Days per week: Not on file    Minutes per session: Not on file  . Stress: Not on file   Relationships  . Social Herbalist on phone: Not on file    Gets together: Not on file    Attends religious service: Not on file    Active member of club or organization: Not on file    Attends meetings of clubs or organizations: Not on file    Relationship  status: Not on file  Other Topics Concern  . Not on file  Social History Narrative   Lives alone in [place]. Has a son who lives locally. Retired from working at [employer].      Family History: The patient's family history includes Hypertension in his father. ROS:   Please see the history of present illness.     All other systems reviewed and are negative.  EKGs/Labs/Other Studies Reviewed:    The following studies were reviewed today:  Carotid Dopplers 07/26/2018 Summary: Right Carotid: Velocities in the right ICA are consistent with a 1-39% stenosis.                Hemodynamically significant plaque >50% visualized in the CCA.                The ECA appears >50% stenosed.  Left Carotid: Evidence consistent with a total occlusion of the left ICA.               Chronically occluded LICA with trickle flow proximally and               reconstitution of flow distally.  Vertebrals:  Bilateral vertebral arteries demonstrate antegrade flow. Subclavians: Bilateral subclavian artery flow was disturbed.   EKG:  EKG is not ordered today.    Recent Labs: 01/02/2019: TSH 1.440 02/07/2019: ALT 12; BUN 18; Creatinine, Ser 1.20; Hemoglobin 13.3; Platelets 216; Potassium 4.2; Sodium 136   Recent Lipid Panel    Component Value Date/Time   CHOL 216 (H) 01/02/2019 1550   TRIG 211 (H) 01/02/2019 1550   HDL 39 (L) 01/02/2019 1550   CHOLHDL 5.5 (H) 01/02/2019 1550   CHOLHDL 5.6 04/15/2009 0320   VLDL 16 04/15/2009 0320   LDLCALC 135 (H) 01/02/2019 1550    Physical Exam:    VS:  BP (!) 122/50   Pulse (!) 51   Ht 5\' 10"  (1.778 m)   Wt 162 lb 12.8 oz (73.8 kg)   SpO2 95%   BMI 23.36 kg/m     Wt Readings from Last  3 Encounters:  02/11/19 162 lb 12.8 oz (73.8 kg)  02/07/19 160 lb (72.6 kg)  01/02/19 161 lb (73 kg)     Physical Exam  Constitutional: He is oriented to person, place, and time. He appears well-developed and well-nourished. No distress.  Elderly male, slightly frail.  HENT:  Head: Normocephalic and atraumatic.  Neck: Normal range of motion. Neck supple. No JVD present. Carotid bruit is present.  Bilateral  Cardiovascular: Regular rhythm, normal heart sounds and intact distal pulses. Bradycardia present. Exam reveals no gallop and no friction rub.  No murmur heard. Pulmonary/Chest: Effort normal and breath sounds normal. No respiratory distress. He has no wheezes. He has no rales.  Abdominal: Soft. Bowel sounds are normal.  Musculoskeletal: Normal range of motion.        General: No edema.  Neurological: He is alert and oriented to person, place, and time.  Skin: Skin is warm and dry.  Psychiatric: He has a normal mood and affect. His behavior is normal. Judgment and thought content normal.  Vitals reviewed.    ASSESSMENT:    1. Dizziness   2. Chronic diastolic heart failure ([place])   3. Coronary artery disease of native artery of native heart with stable angina pectoris ([place])   4. Essential (primary) hypertension   5. Carotid artery disease, unspecified laterality, unspecified type ([place])   6. Hyperlipidemia, unspecified hyperlipidemia type    PLAN:  In order of problems listed above:  Dizziness -Patient with dizziness essentially upon standing. -Labs per PCP on 02/07/2019 were unremarkable with normal hemoglobin and iron studies.  TSH 01/02/2019 was 1.44 -Started after a new Rx of carvedilol 25 mg 1/2 tab. The pills are not scored so could be getting inconsistent medication doses. HR in the 50's. Will decrease carvedilol to 6.25 mg.  -Lasix has been reduced and patient has not had any Lasix in the last 4 days. Pt and son feels that he is doing well off lasix. After discussion  with them, will decrease lasix to 40 mg prn. We had in depth discussion on when to take lasix. He will get a scale and take Lasix for increase in weight and/or swelling.  Detailed instructions provided. -Reviewed safety precautions including pumping feet prior to standing, rising slowly, and moving slowly.  Patient advised not to drive as long as he is still having dizziness.  Chronic diastolic CHF -Per PCP on [id number], Lasix was changed as above. -Patient seems to have chronic dyspnea on exertion.  I discussed that volume overload can worsen this with the patient and his son.  As above detailed instructions given on when to take as needed Lasix and what to report to our office.  CAD with stable angina -Patient has occasional chest pressure, mostly when he is upset, resolves once he takes a few deep breaths.  Hypertension -On amlodipine 5 mg daily, carvedilol 12.5 mg twice daily, losartan 100 mg daily. -Blood pressure is well controlled.  Heart rate is in the 50s.  Will decrease carvedilol as above.  Carotid artery stenosis without history of stroke -Duplex on 07/26/2018 showed right ICA 1 to 39% stenosed, total occlusion of the left ICA -Patient has some dizziness upon standing but otherwise no new neurologic deficits.  Hyperlipidemia -Lipid panel in 12/2018 with total cholesterol of 216, HDL 39, LDL 135, triglycerides 211.  Patient noted to be statin intolerant. -I attempted to discuss improving the patient's diet, however his son states that the patient is 58 years old and they feel like he should be able to eat what he wants at this point.  Medication Adjustments/Labs and Tests Ordered: Current medicines are reviewed at length with the patient today.  Concerns regarding medicines are outlined above. Labs and tests ordered and medication changes are outlined in the patient instructions below:  Patient Instructions  Medication Instructions:  DECREASE: Carvedilol to 6.25 twice a day    DECREASE: Lasix to only as needed for increased weight gain or swelling   If you need a refill on your cardiac medications before your next appointment, please call your pharmacy.   Lab work: None   If you have labs (blood work) drawn today and your tests are completely normal, you will receive your results only by: [name] MyChart Message (if you have MyChart) OR . A paper copy in the mail If you have any lab test that is abnormal or we need to change your treatment, we will call you to review the results.  Testing/Procedures: None   Follow-Up: At [facility], you and your health needs are our priority.  As part of our continuing mission to provide you with exceptional heart care, we have created designated Provider Care Teams.  These Care Teams include your primary Cardiologist (physician) and Advanced Practice Providers (APPs -  Physician Assistants and Nurse Practitioners) who all work together to provide you with the care you need, when you need it. You will need a  follow up appointment in:  3-4 months.  Please call our office 2 months in advance to schedule this appointment.  You may see [name], MD or one of the following Advanced Practice Providers on your designated Care Team: Richardson Dopp, PA-C [place], [place] . Daune Perch, NP  Any Other Special Instructions Will Be Listed Below (If Applicable).   Do the following things EVERY DAY:   1. Weigh yourself EVERY morning after you go to the bathroom but before you eat or drink anything. Write this number down in a weight log/diary. If you gain 3 pounds overnight or 5 pounds in a week or you have increased swelling, take a dose of lasix. If weight does not improve, call the office.   2. Take your medicines as prescribed. If you have concerns about your medications, please call us before you stop taking them.    3. Eat low salt foods-Limit salt (sodium) to 2000 mg per day. This will help prevent your body from holding onto  fluid. Read food labels as many processed foods have a lot of sodium, especially canned goods and prepackaged meats. If you would like some assistance choosing low sodium foods, we would be happy to set you up with a nutritionist.   4. Stay as active as you can everyday. Staying active will give you more energy and make your muscles stronger. Start with 5 minutes at a time and work your way up to 30 minutes a day. Break up your activities--do some in the morning and some in the afternoon. Start with 3 days per week and work your way up to 5 days as you can.  If you have chest pain, feel short of breath, dizzy, or lightheaded, STOP. If you don't feel better after a short rest, call 911. If you do feel better, call the office to let us know you have symptoms with exercise.   5. Limit all fluids for the day to less than 2 liters. Fluid includes all drinks, coffee, juice, ice chips, soup, jello, and all other liquids.   =================================================   Dizziness Dizziness is a common problem. It makes you feel unsteady or light-headed. You may feel like you are about to pass out (faint). Dizziness can lead to getting hurt if you stumble or fall. Dizziness can be caused by many things, including:  Medicines.  Not having enough water in your body (dehydration).  Illness. Follow these instructions at home: Eating and drinking   Drink enough fluid to keep your pee (urine) clear or pale yellow. This helps to keep you from getting dehydrated. Try to drink more clear fluids, such as water.  Do not drink alcohol.  Limit how much caffeine you drink or eat, if your doctor tells you to do that.  Limit how much salt (sodium) you drink or eat, if your doctor tells you to do that. Activity   Avoid making quick movements. ? When you stand up from sitting in a chair, steady yourself until you feel okay. ? In the morning, first sit up on the side of the bed. When you feel okay, stand  slowly while you hold onto something. Do this until you know that your balance is fine.  If you need to stand in one place for a long time, move your legs often. Tighten and relax the muscles in your legs while you are standing.  Do not drive or use heavy machinery if you feel dizzy.  Avoid bending down if you feel  dizzy. Place items in your home so you can reach them easily without leaning over. Lifestyle  Do not use any products that contain nicotine or tobacco, such as cigarettes and e-cigarettes. If you need help quitting, ask your doctor.  Try to lower your stress level. You can do this by using methods such as yoga or meditation. Talk with your doctor if you need help. General instructions  Watch your dizziness for any changes.  Take over-the-counter and prescription medicines only as told by your doctor. Talk with your doctor if you think that you are dizzy because of a medicine that you are taking.  Tell a friend or a family member that you are feeling dizzy. If he or she notices any changes in your behavior, have this person call your doctor.  Keep all follow-up visits as told by your doctor. This is important. Contact a doctor if:  Your dizziness does not go away.  Your dizziness or light-headedness gets worse.  You feel sick to your stomach (nauseous).  You have trouble hearing.  You have new symptoms.  You are unsteady on your feet.  You feel like the room is spinning. Get help right away if:  You throw up (vomit) or have watery poop (diarrhea), and you cannot eat or drink anything.  You have trouble: ? Talking. ? Walking. ? Swallowing. ? Using your arms, hands, or legs.  You feel generally weak.  You are not thinking clearly, or you have trouble forming sentences. A friend or family member may notice this.  You have: ? Chest pain. ? Pain in your belly (abdomen). ? Shortness of breath. ? Sweating.  Your vision changes.  You are bleeding.  You  have a very bad headache.  You have neck pain or a stiff neck.  You have a fever. These symptoms may be an emergency. Do not wait to see if the symptoms will go away. Get medical help right away. Call your local emergency services (911 in the [place] Do not drive yourself to the hospital. Summary  Dizziness makes you feel unsteady or light-headed. You may feel like you are about to pass out (faint).  Drink enough fluid to keep your pee (urine) clear or pale yellow. Do not drink alcohol.  Avoid making quick movements if you feel dizzy.  Watch your dizziness for any changes. This information is not intended to replace advice given to you by your health care provider. Make sure you discuss any questions you have with your health care provider. Document Released: 05/11/2011 Document Revised: 05/25/2017 Document Reviewed: 06/08/2016 Elsevier Patient Education  2020 [place], Daune Perch, NP  02/11/2019 10:07 PM    [place]

## 2019-02-12 NOTE — Telephone Encounter (Signed)
Patient had cardiac evaluation 9/8. See office note.

## 2019-02-26 ENCOUNTER — Other Ambulatory Visit: Payer: Self-pay | Admitting: *Deleted

## 2019-02-26 MED ORDER — ACCU-CHEK AVIVA PLUS VI STRP: ORAL_STRIP | 3 refills | Status: DC

## 2019-02-26 MED ORDER — SITAGLIPTIN PHOSPHATE 100 MG PO TABS: 100.0000 mg | ORAL_TABLET | ORAL | 0 refills | Status: DC | PRN

## 2019-04-07 ENCOUNTER — Other Ambulatory Visit: Payer: Self-pay

## 2019-04-07 ENCOUNTER — Ambulatory Visit (INDEPENDENT_AMBULATORY_CARE_PROVIDER_SITE_OTHER): Payer: Medicare HMO | Admitting: Family Medicine

## 2019-04-07 ENCOUNTER — Encounter: Payer: Self-pay | Admitting: Family Medicine

## 2019-04-07 DIAGNOSIS — I10 Essential (primary) hypertension: Secondary | ICD-10-CM

## 2019-04-07 DIAGNOSIS — E039 Hypothyroidism, unspecified: Secondary | ICD-10-CM | POA: Diagnosis not present

## 2019-04-07 DIAGNOSIS — E1169 Type 2 diabetes mellitus with other specified complication: Secondary | ICD-10-CM | POA: Diagnosis not present

## 2019-04-07 DIAGNOSIS — E1140 Type 2 diabetes mellitus with diabetic neuropathy, unspecified: Secondary | ICD-10-CM | POA: Diagnosis not present

## 2019-04-07 DIAGNOSIS — E785 Hyperlipidemia, unspecified: Secondary | ICD-10-CM

## 2019-04-07 DIAGNOSIS — E1149 Type 2 diabetes mellitus with other diabetic neurological complication: Secondary | ICD-10-CM

## 2019-04-07 MED ORDER — LEVOTHYROXINE SODIUM 100 MCG PO TABS: 100.0000 ug | ORAL_TABLET | Freq: Every day | ORAL | 1 refills | Status: DC

## 2019-04-07 MED ORDER — SITAGLIPTIN PHOSPHATE 100 MG PO TABS: 100.0000 mg | ORAL_TABLET | ORAL | 0 refills | Status: DC | PRN

## 2019-04-07 MED ORDER — CELECOXIB 200 MG PO CAPS: 200.0000 mg | ORAL_CAPSULE | Freq: Every day | ORAL | 5 refills | Status: DC

## 2019-04-07 NOTE — Progress Notes (Signed)
Subjective:    Patient ID: Sean Hartman, male    DOB: [birth year], 85 y.o.   MRN: [id number]   HPI: Sean CLEMENSON is a 85 y.o. male presenting for  presents forFollow-up of diabetes. Patient checks blood sugar at home.   140-150 fasting and 200 postprandial Patient denies symptoms such as polyuria, polydipsia, excessive hunger, nausea No significant hypoglycemic spells noted. Medications reviewed. Pt reports taking them regularly without complication/adverse reaction being reported today. Eating what he wants, several times a day. Limits carbs some. Does eat potatoes and pinto beans in the afternoon.    Depression screen [facility] 2/9 02/07/2019 01/02/2019  Decreased Interest 0 0  Down, Depressed, Hopeless 0 0  PHQ - 2 Score 0 0  Altered sleeping - 0  Tired, decreased energy - 0  Change in appetite - 0  Feeling bad or failure about yourself  - 0  Trouble concentrating - 0  Moving slowly or fidgety/restless - 0  Suicidal thoughts - 0  PHQ-9 Score - 0     Relevant past medical, surgical, family and social history reviewed and updated as indicated.  Interim medical history since our last visit reviewed. Allergies and medications reviewed and updated.  ROS:  Review of Systems  Constitutional: Negative.   HENT: Negative.   Eyes: Negative for visual disturbance.  Respiratory: Positive for shortness of breath (with exhaustion). Negative for cough.   Cardiovascular: Negative for chest pain and leg swelling.  Gastrointestinal: Negative for abdominal pain, diarrhea, nausea and vomiting.  Genitourinary: Negative for difficulty urinating.  Musculoskeletal: Positive for arthralgias (ankle pain with walking. Aching. ). Negative for myalgias.  Skin: Negative for rash.  Neurological: Negative for headaches.  Psychiatric/Behavioral: Negative for sleep disturbance.     Social History   Tobacco Use  Smoking Status Former Smoker  . Years: 15.00  . Quit date: 09/23/1998  . Years  since quitting: 20.5  Smokeless Tobacco Never Used       Objective:     Wt Readings from Last 3 Encounters:  02/11/19 162 lb 12.8 oz (73.8 kg)  02/07/19 160 lb (72.6 kg)  01/02/19 161 lb (73 kg)     Exam deferred. Pt. [place] due to COVID 19. Phone visit performed.   Assessment & Plan:   1. Acquired hypothyroidism   2. Essential hypertension   3. Diabetic neuropathy with neurologic complication ([place])   4. DM type 2 with diabetic dyslipidemia ([place])     Meds ordered this encounter  Medications  . celecoxib (CELEBREX) 200 MG capsule    Sig: Take 1 capsule (200 mg total) by mouth daily. With food    Dispense:  30 capsule    Refill:  5  . levothyroxine (SYNTHROID) 100 MCG tablet    Sig: Take 1 tablet (100 mcg total) by mouth daily.    Dispense:  90 tablet    Refill:  1  . sitaGLIPtin (JANUVIA) 100 MG tablet    Sig: Take 1 tablet (100 mg total) by mouth as needed (diabetes).    Dispense:  90 tablet    Refill:  0    No orders of the defined types were placed in this encounter.     Diagnoses and all orders for this visit:  Acquired hypothyroidism  Essential hypertension  Diabetic neuropathy with neurologic complication ([place])  DM type 2 with diabetic dyslipidemia ([place])  Other orders -     celecoxib (CELEBREX) 200 MG capsule; Take 1 capsule (200 mg total) by mouth  daily. With food -     levothyroxine (SYNTHROID) 100 MCG tablet; Take 1 tablet (100 mcg total) by mouth daily. -     sitaGLIPtin (JANUVIA) 100 MG tablet; Take 1 tablet (100 mg total) by mouth as needed (diabetes).    Virtual Visit via telephone Note  I discussed the limitations, risks, security and privacy concerns of performing an evaluation and management service by telephone and the availability of in person appointments. The patient was identified with two identifiers. Pt.expressed understanding and agreed to proceed. Pt. Is at home. Son, [name] is with him and helps with history. Dr. Livia Snellen is in  his office.  Follow Up Instructions:   I discussed the assessment and treatment plan with the patient. The patient was provided an opportunity to ask questions and all were answered. The patient agreed with the plan and demonstrated an understanding of the instructions.   The patient was advised to call back or seek an in-person evaluation if the symptoms worsen or if the condition fails to improve as anticipated.   Total minutes including chart review and phone contact time: 25   Follow up plan: Return in about 3 months (around 07/08/2019) for diabetes, in office.  Claretta Fraise, MD [place]

## 2019-04-17 ENCOUNTER — Other Ambulatory Visit: Payer: Self-pay

## 2019-04-17 ENCOUNTER — Ambulatory Visit (INDEPENDENT_AMBULATORY_CARE_PROVIDER_SITE_OTHER): Payer: Medicare HMO

## 2019-04-17 DIAGNOSIS — Z23 Encounter for immunization: Secondary | ICD-10-CM | POA: Diagnosis not present

## 2019-05-14 ENCOUNTER — Other Ambulatory Visit: Payer: Self-pay | Admitting: Family Medicine

## 2019-05-14 ENCOUNTER — Telehealth: Payer: Self-pay | Admitting: *Deleted

## 2019-05-14 MED ORDER — SPIRIVA HANDIHALER 18 MCG IN CAPS: 18.0000 ug | ORAL_CAPSULE | Freq: Every day | RESPIRATORY_TRACT | 11 refills | Status: DC | PRN

## 2019-05-14 NOTE — Telephone Encounter (Signed)
I sent in the requested prescription 

## 2019-05-14 NOTE — Telephone Encounter (Signed)
Fax from [facility] RF request for Spiriva handihlr 18 mcg cap #30 1 cap daily prn New pt 01/02/19, no longer on med list Please advise

## 2019-05-21 ENCOUNTER — Other Ambulatory Visit: Payer: Self-pay | Admitting: *Deleted

## 2019-05-21 MED ORDER — ACCU-CHEK AVIVA PLUS VI STRP: ORAL_STRIP | 3 refills | Status: DC

## 2019-05-21 MED ORDER — LEVOTHYROXINE SODIUM 100 MCG PO TABS: 100.0000 ug | ORAL_TABLET | Freq: Every day | ORAL | 1 refills | Status: DC

## 2019-05-21 MED ORDER — SPIRIVA HANDIHALER 18 MCG IN CAPS: 18.0000 ug | ORAL_CAPSULE | Freq: Every day | RESPIRATORY_TRACT | 3 refills | Status: DC | PRN

## 2019-05-23 ENCOUNTER — Telehealth: Payer: Self-pay | Admitting: Family Medicine

## 2019-05-23 MED ORDER — LOSARTAN POTASSIUM 100 MG PO TABS: 100.0000 mg | ORAL_TABLET | Freq: Every day | ORAL | 0 refills | Status: DC

## 2019-05-23 MED ORDER — FAMOTIDINE 20 MG PO TABS: ORAL_TABLET | ORAL | 1 refills | Status: DC

## 2019-05-23 MED ORDER — SITAGLIPTIN PHOSPHATE 100 MG PO TABS: 100.0000 mg | ORAL_TABLET | ORAL | 0 refills | Status: DC | PRN

## 2019-05-23 MED ORDER — CARVEDILOL 6.25 MG PO TABS: 6.2500 mg | ORAL_TABLET | Freq: Two times a day (BID) | ORAL | 0 refills | Status: DC

## 2019-05-23 MED ORDER — AMLODIPINE BESYLATE 5 MG PO TABS: ORAL_TABLET | ORAL | 0 refills | Status: DC

## 2019-05-23 MED ORDER — LEVOTHYROXINE SODIUM 100 MCG PO TABS: 100.0000 ug | ORAL_TABLET | Freq: Every day | ORAL | 0 refills | Status: DC

## 2019-05-23 MED ORDER — FUROSEMIDE 40 MG PO TABS: 40.0000 mg | ORAL_TABLET | ORAL | 0 refills | Status: DC | PRN

## 2019-05-23 NOTE — Telephone Encounter (Signed)
Patient aware that refills have been sent to [facility].

## 2019-05-23 NOTE — Telephone Encounter (Signed)
What is the name of the medication? Wants all current medications called into [facility]. Not going to use [facility] anymore. Wants #90  Have you contacted your pharmacy to request a refill? NO  Which pharmacy would you like this sent to? [facility]   Patient notified that their request is being sent to the clinical staff for review and that they should receive a call once it is complete. If they do not receive a call within 24 hours they can check with their pharmacy or our office.

## 2019-05-26 ENCOUNTER — Telehealth: Payer: Self-pay | Admitting: *Deleted

## 2019-05-26 ENCOUNTER — Other Ambulatory Visit: Payer: Self-pay | Admitting: Family Medicine

## 2019-05-26 MED ORDER — FUROSEMIDE 40 MG PO TABS: 40.0000 mg | ORAL_TABLET | Freq: Every day | ORAL | 1 refills | Status: DC | PRN

## 2019-05-26 MED ORDER — SITAGLIPTIN PHOSPHATE 100 MG PO TABS: 100.0000 mg | ORAL_TABLET | Freq: Every day | ORAL | 1 refills | Status: DC

## 2019-05-26 NOTE — Telephone Encounter (Addendum)
Fax from [facility] for Januvia 100 mg missing dosing frequency Please clarify

## 2019-05-26 NOTE — Telephone Encounter (Signed)
Fax from [facility] Furosemide 40 mg missing dosing frequency Please clarify

## 2019-05-26 NOTE — Telephone Encounter (Signed)
I sent in the requested prescription 

## 2019-07-07 ENCOUNTER — Other Ambulatory Visit: Payer: Self-pay

## 2019-07-07 ENCOUNTER — Encounter (INDEPENDENT_AMBULATORY_CARE_PROVIDER_SITE_OTHER): Payer: Medicare HMO | Admitting: Ophthalmology

## 2019-07-08 ENCOUNTER — Encounter (HOSPITAL_COMMUNITY): Payer: Self-pay | Admitting: Cardiovascular Disease

## 2019-07-08 ENCOUNTER — Ambulatory Visit (INDEPENDENT_AMBULATORY_CARE_PROVIDER_SITE_OTHER): Payer: Medicare HMO | Admitting: Family Medicine

## 2019-07-08 ENCOUNTER — Encounter: Payer: Self-pay | Admitting: Family Medicine

## 2019-07-08 VITALS — BP 137/77 | HR 57 | Temp 98.0°F | Ht 70.0 in | Wt 163.0 lb

## 2019-07-08 DIAGNOSIS — E039 Hypothyroidism, unspecified: Secondary | ICD-10-CM

## 2019-07-08 DIAGNOSIS — E1149 Type 2 diabetes mellitus with other diabetic neurological complication: Secondary | ICD-10-CM

## 2019-07-08 DIAGNOSIS — E1140 Type 2 diabetes mellitus with diabetic neuropathy, unspecified: Secondary | ICD-10-CM | POA: Diagnosis not present

## 2019-07-08 DIAGNOSIS — E1169 Type 2 diabetes mellitus with other specified complication: Secondary | ICD-10-CM

## 2019-07-08 DIAGNOSIS — E785 Hyperlipidemia, unspecified: Secondary | ICD-10-CM

## 2019-07-08 DIAGNOSIS — I10 Essential (primary) hypertension: Secondary | ICD-10-CM | POA: Diagnosis not present

## 2019-07-08 LAB — BAYER DCA HB A1C WAIVED: HB A1C (BAYER DCA - WAIVED): 7.8 % — ABNORMAL HIGH (ref <7.0)

## 2019-07-08 MED ORDER — SITAGLIPTIN PHOSPHATE 100 MG PO TABS: 100.0000 mg | ORAL_TABLET | Freq: Every day | ORAL | 1 refills | Status: DC

## 2019-07-08 MED ORDER — AMLODIPINE BESYLATE 5 MG PO TABS: ORAL_TABLET | ORAL | 0 refills | Status: DC

## 2019-07-08 MED ORDER — LOSARTAN POTASSIUM 100 MG PO TABS: 100.0000 mg | ORAL_TABLET | Freq: Every day | ORAL | 0 refills | Status: DC

## 2019-07-08 MED ORDER — CARVEDILOL 6.25 MG PO TABS: 6.2500 mg | ORAL_TABLET | Freq: Two times a day (BID) | ORAL | 0 refills | Status: DC

## 2019-07-08 MED ORDER — LEVOTHYROXINE SODIUM 100 MCG PO TABS: 100.0000 ug | ORAL_TABLET | Freq: Every day | ORAL | 0 refills | Status: DC

## 2019-07-08 NOTE — Progress Notes (Signed)
Subjective:  Patient ID: Sean Hartman, male    DOB: 27-Aug-1922  Age: 86 y.o. MRN: [id number]  CC: Follow-up (3 month)   HPI JAILEN COWARD presents forFollow-up of diabetes. Patient does not check blood sugar at home.    Patient denies symptoms such as polyuria, polydipsia, excessive hunger, nausea No significant hypoglycemic spells noted. Medications reviewed. Pt reports taking them regularly without complication/adverse reaction being reported today.  Checking feet daily.  Follow-up of hypertension. Patient has no history of headache chest pain or shortness of breath or recent cough. Patient also denies symptoms of TIA such as numbness weakness lateralizing. Patient checks  blood pressure at home and has not had any elevated readings recently. Patient denies side effects from his medication. States taking it regularly. Patient presents for follow-up on  thyroid. The patient has a history of hypothyroidism for many years. It has been stable recently. Pt. denies any change in  voice, loss of hair, heat or cold intolerance. Energy level has been adequate to good. Patient denies constipation and diarrhea. No myxedema. Medication is as noted below. Verified that pt is taking it daily on an empty stomach. Well tolerated.   History Brance has a past medical history of Anemia, CAD (coronary artery disease), Carotid stenosis, Colon cancer ([place]), DM2 (diabetes mellitus, type 2) ([place]), Essential hypertension, HLD (hyperlipidemia), and Ischemic cardiomyopathy.   He has a past surgical history that includes Cataract extraction w/ intraocular lens  implant, bilateral; Colectomy; Coronary angioplasty with stent; Inguinal hernia repair (Left); 25 gauge pars plana vitrectomy with 20 gauge mvr port (Left, 03/20/2017); and 25 gauge pars plana vitrectomy with 20 gauge mvr port (Left, 03/20/2017).   His family history includes Hypertension in his father.He reports that he quit smoking about 20 years  ago. He quit after 15.00 years of use. He has never used smokeless tobacco. He reports that he does not drink alcohol or use drugs.  Current Outpatient Medications on File Prior to Visit  Medication Sig Dispense Refill  . aspirin (ASPIR-81) 81 MG EC tablet Take 81 mg by mouth daily.      . famotidine (PEPCID) 20 MG tablet Take 2 tablets by mouth once daily 180 tablet 1  . furosemide (LASIX) 40 MG tablet Take 1 tablet (40 mg total) by mouth daily as needed (increased weight gain or swelling). 90 tablet 1  . glucose blood (ACCU-CHEK AVIVA PLUS) test strip Test BS two to three times a day  Dx E11.65 300 each 3  . nitroGLYCERIN (NITROSTAT) 0.4 MG SL tablet DISSOLVE 1 TABLET UNDER TONGUE EVERY 5 MINUTES AS NEEDED FOR CHEST PAIN UP TO 3 DOSES 25 tablet 3  . tiotropium (SPIRIVA HANDIHALER) 18 MCG inhalation capsule Place 1 capsule (18 mcg total) into inhaler and inhale daily as needed (shortness of breath). 90 capsule 3   No current facility-administered medications on file prior to visit.    ROS Review of Systems  Constitutional: Negative for fever.  Respiratory: Negative for shortness of breath.   Cardiovascular: Negative for chest pain.  Musculoskeletal: Negative for arthralgias.  Skin: Negative for rash.    Objective:  BP 137/77   Pulse (!) 57   Temp 98 F (36.7 C) (Temporal)   Ht 5' 10" (1.778 m)   Wt 163 lb (73.9 kg)   BMI 23.39 kg/m   BP Readings from Last 3 Encounters:  07/08/19 137/77  02/11/19 (!) 122/50  02/07/19 (!) 160/79    Wt Readings from Last 3 Encounters:  07/08/19 163 lb (73.9 kg)  02/11/19 162 lb 12.8 oz (73.8 kg)  02/07/19 160 lb (72.6 kg)     Physical Exam Constitutional:      General: He is not in acute distress.    Appearance: He is well-developed.  HENT:     Head: Normocephalic and atraumatic.     Right Ear: External ear normal.     Left Ear: External ear normal.     Nose: Nose normal.  Eyes:     Conjunctiva/sclera: Conjunctivae normal.      Pupils: Pupils are equal, round, and reactive to light.  Cardiovascular:     Rate and Rhythm: Normal rate and regular rhythm.     Heart sounds: Normal heart sounds. No murmur.  Pulmonary:     Effort: Pulmonary effort is normal. No respiratory distress.     Breath sounds: Normal breath sounds. No wheezing or rales.  Abdominal:     Palpations: Abdomen is soft.     Tenderness: There is no abdominal tenderness.  Musculoskeletal:        General: Normal range of motion.     Cervical back: Normal range of motion and neck supple.  Skin:    General: Skin is warm and dry.  Neurological:     Mental Status: He is alert and oriented to person, place, and time.     Deep Tendon Reflexes: Reflexes are normal and symmetric.  Psychiatric:        Behavior: Behavior normal.        Thought Content: Thought content normal.        Judgment: Judgment normal.       Assessment & Plan:   Meliton was seen today for follow-up.  Diagnoses and all orders for this visit:  Acquired hypothyroidism -     CBC with Differential/Platelet -     CMP14+EGFR -     Lipid panel -     Thyroid Panel With TSH -     levothyroxine (SYNTHROID) 100 MCG tablet; Take 1 tablet (100 mcg total) by mouth daily.  Essential hypertension -     CBC with Differential/Platelet -     CMP14+EGFR -     Lipid panel -     Microalbumin / creatinine urine ratio -     losartan (COZAAR) 100 MG tablet; Take 1 tablet (100 mg total) by mouth daily. -     carvedilol (COREG) 6.25 MG tablet; Take 1 tablet (6.25 mg total) by mouth 2 (two) times daily with a meal.  Diabetic neuropathy with neurologic complication (HCC) -     CBC with Differential/Platelet -     CMP14+EGFR -     Lipid panel -     Microalbumin / creatinine urine ratio -     Bayer DCA Hb A1c Waived -     losartan (COZAAR) 100 MG tablet; Take 1 tablet (100 mg total) by mouth daily. -     carvedilol (COREG) 6.25 MG tablet; Take 1 tablet (6.25 mg total) by mouth 2 (two) times daily  with a meal. -     sitaGLIPtin (JANUVIA) 100 MG tablet; Take 1 tablet (100 mg total) by mouth daily.  DM type 2 with diabetic dyslipidemia ([place]) -     CBC with Differential/Platelet -     CMP14+EGFR -     Lipid panel -     Microalbumin / creatinine urine ratio -     Bayer DCA Hb A1c Waived -     sitaGLIPtin (JANUVIA)  100 MG tablet; Take 1 tablet (100 mg total) by mouth daily.  Hypothyroidism, unspecified type -     CBC with Differential/Platelet -     CMP14+EGFR -     Lipid panel -     Thyroid Panel With TSH -     levothyroxine (SYNTHROID) 100 MCG tablet; Take 1 tablet (100 mcg total) by mouth daily.  Other orders -     amLODipine (NORVASC) 5 MG tablet; TAKE 1 TABLET BY MOUTH ONCE DAILY. PLEASE KEEP UPCOMING APPOINTMENT WITH DR. Burt Knack FOR FUTURE REFILLS.      I have discontinued Nikki Dom. Sievers's celecoxib. I am also having him maintain his aspirin, nitroGLYCERIN, Accu-Chek Aviva Plus, Spiriva HandiHaler, famotidine, furosemide, losartan, levothyroxine, carvedilol, amLODipine, and sitaGLIPtin.  Meds ordered this encounter  Medications  . losartan (COZAAR) 100 MG tablet    Sig: Take 1 tablet (100 mg total) by mouth daily.    Dispense:  90 tablet    Refill:  0  . levothyroxine (SYNTHROID) 100 MCG tablet    Sig: Take 1 tablet (100 mcg total) by mouth daily.    Dispense:  90 tablet    Refill:  0  . carvedilol (COREG) 6.25 MG tablet    Sig: Take 1 tablet (6.25 mg total) by mouth 2 (two) times daily with a meal.    Dispense:  180 tablet    Refill:  0  . amLODipine (NORVASC) 5 MG tablet    Sig: TAKE 1 TABLET BY MOUTH ONCE DAILY. PLEASE KEEP UPCOMING APPOINTMENT WITH DR. Burt Knack FOR FUTURE REFILLS.    Dispense:  90 tablet    Refill:  0  . sitaGLIPtin (JANUVIA) 100 MG tablet    Sig: Take 1 tablet (100 mg total) by mouth daily.    Dispense:  90 tablet    Refill:  1     Follow-up: Return in about 3 months (around 10/05/2019).  Claretta Fraise, M.D.

## 2019-07-08 NOTE — Patient Instructions (Signed)
Carbohydrate Counting for Diabetes Mellitus, Adult  Carbohydrate counting is a method of keeping track of how many carbohydrates you eat. Eating carbohydrates naturally increases the amount of sugar (glucose) in the blood. Counting how many carbohydrates you eat helps keep your blood glucose within normal limits, which helps you manage your diabetes (diabetes mellitus). It is important to know how many carbohydrates you can safely have in each meal. This is different for every person. A diet and nutrition specialist (registered dietitian) can help you make a meal plan and calculate how many carbohydrates you should have at each meal and snack. Carbohydrates are found in the following foods:  Grains, such as breads and cereals.  Dried beans and soy products.  Starchy vegetables, such as potatoes, peas, and corn.  Fruit and fruit juices.  Milk and yogurt.  Sweets and snack foods, such as cake, cookies, candy, chips, and soft drinks. How do I count carbohydrates? There are two ways to count carbohydrates in food. You can use either of the methods or a combination of both. Reading "Nutrition Facts" on packaged food The "Nutrition Facts" list is included on the labels of almost all packaged foods and beverages in the [place] It includes:  The serving size.  Information about nutrients in each serving, including the grams (g) of carbohydrate per serving. To use the "Nutrition Facts":  Decide how many servings you will have.  Multiply the number of servings by the number of carbohydrates per serving.  The resulting number is the total amount of carbohydrates that you will be having. Learning standard serving sizes of other foods When you eat carbohydrate foods that are not packaged or do not include "Nutrition Facts" on the label, you need to measure the servings in order to count the amount of carbohydrates:  Measure the foods that you will eat with a food scale or measuring cup, if  needed.  Decide how many standard-size servings you will eat.  Multiply the number of servings by 15. Most carbohydrate-rich foods have about 15 g of carbohydrates per serving. ? For example, if you eat 8 oz (170 g) of strawberries, you will have eaten 2 servings and 30 g of carbohydrates (2 servings x 15 g = 30 g).  For foods that have more than one food mixed, such as soups and casseroles, you must count the carbohydrates in each food that is included. The following list contains standard serving sizes of common carbohydrate-rich foods. Each of these servings has about 15 g of carbohydrates:   hamburger bun or  English muffin.   oz (15 mL) syrup.   oz (14 g) jelly.  1 slice of bread.  1 six-inch tortilla.  3 oz (85 g) cooked rice or pasta.  4 oz (113 g) cooked dried beans.  4 oz (113 g) starchy vegetable, such as peas, corn, or potatoes.  4 oz (113 g) hot cereal.  4 oz (113 g) mashed potatoes or  of a large baked potato.  4 oz (113 g) canned or frozen fruit.  4 oz (120 mL) fruit juice.  4-6 crackers.  6 chicken nuggets.  6 oz (170 g) unsweetened dry cereal.  6 oz (170 g) plain fat-free yogurt or yogurt sweetened with artificial sweeteners.  8 oz (240 mL) milk.  8 oz (170 g) fresh fruit or one small piece of fruit.  24 oz (680 g) popped popcorn. Example of carbohydrate counting Sample meal  3 oz (85 g) chicken breast.  6 oz (170 g)   brown rice.  4 oz (113 g) corn.  8 oz (240 mL) milk.  8 oz (170 g) strawberries with sugar-free whipped topping. Carbohydrate calculation 1. Identify the foods that contain carbohydrates: ? Rice. ? Corn. ? Milk. ? Strawberries. 2. Calculate how many servings you have of each food: ? 2 servings rice. ? 1 serving corn. ? 1 serving milk. ? 1 serving strawberries. 3. Multiply each number of servings by 15 g: ? 2 servings rice x 15 g = 30 g. ? 1 serving corn x 15 g = 15 g. ? 1 serving milk x 15 g = 15 g. ? 1  serving strawberries x 15 g = 15 g. 4. Add together all of the amounts to find the total grams of carbohydrates eaten: ? 30 g + 15 g + 15 g + 15 g = 75 g of carbohydrates total. Summary  Carbohydrate counting is a method of keeping track of how many carbohydrates you eat.  Eating carbohydrates naturally increases the amount of sugar (glucose) in the blood.  Counting how many carbohydrates you eat helps keep your blood glucose within normal limits, which helps you manage your diabetes.  A diet and nutrition specialist (registered dietitian) can help you make a meal plan and calculate how many carbohydrates you should have at each meal and snack. This information is not intended to replace advice given to you by your health care provider. Make sure you discuss any questions you have with your health care provider. Document Revised: 12/14/2016 Document Reviewed: 11/03/2015 Elsevier Patient Education  2020 Elsevier Inc.  

## 2019-07-09 LAB — CBC WITH DIFFERENTIAL/PLATELET
Basophils Absolute: 0.1 10*3/uL (ref 0.0–0.2)
Basos: 1 %
EOS (ABSOLUTE): 0.2 10*3/uL (ref 0.0–0.4)
Eos: 4 %
Hematocrit: 39.5 % (ref 37.5–51.0)
Hemoglobin: 13.4 g/dL (ref 13.0–17.7)
Immature Grans (Abs): 0 10*3/uL (ref 0.0–0.1)
Immature Granulocytes: 0 %
Lymphocytes Absolute: 1 10*3/uL (ref 0.7–3.1)
Lymphs: 19 %
MCH: 31.2 pg (ref 26.6–33.0)
MCHC: 33.9 g/dL (ref 31.5–35.7)
MCV: 92 fL (ref 79–97)
Monocytes Absolute: 0.5 10*3/uL (ref 0.1–0.9)
Monocytes: 10 %
Neutrophils Absolute: 3.5 10*3/uL (ref 1.4–7.0)
Neutrophils: 66 %
Platelets: 197 10*3/uL (ref 150–450)
RBC: 4.3 x10E6/uL (ref 4.14–5.80)
RDW: 13.2 % (ref 11.6–15.4)
WBC: 5.3 10*3/uL (ref 3.4–10.8)

## 2019-07-09 LAB — SPECIMEN STATUS REPORT

## 2019-07-09 LAB — CMP14+EGFR
ALT: 12 IU/L (ref 0–44)
AST: 19 IU/L (ref 0–40)
Albumin/Globulin Ratio: 1.7 (ref 1.2–2.2)
Albumin: 4.3 g/dL (ref 3.5–4.6)
Alkaline Phosphatase: 69 IU/L (ref 39–117)
BUN/Creatinine Ratio: 16 (ref 10–24)
BUN: 18 mg/dL (ref 10–36)
Bilirubin Total: 0.5 mg/dL (ref 0.0–1.2)
CO2: 22 mmol/L (ref 20–29)
Calcium: 9.7 mg/dL (ref 8.6–10.2)
Chloride: 103 mmol/L (ref 96–106)
Creatinine, Ser: 1.12 mg/dL (ref 0.76–1.27)
GFR calc Af Amer: 65 mL/min/{1.73_m2} (ref >59)
GFR calc non Af Amer: 56 mL/min/{1.73_m2} — ABNORMAL LOW (ref >59)
Globulin, Total: 2.5 g/dL (ref 1.5–4.5)
Glucose: 162 mg/dL — ABNORMAL HIGH (ref 65–99)
Potassium: 4.4 mmol/L (ref 3.5–5.2)
Sodium: 137 mmol/L (ref 134–144)
Total Protein: 6.8 g/dL (ref 6.0–8.5)

## 2019-07-09 LAB — THYROID PANEL WITH TSH
Free Thyroxine Index: 2.4 (ref 1.2–4.9)
T3 Uptake Ratio: 31 % (ref 24–39)
T4, Total: 7.9 ug/dL (ref 4.5–12.0)
TSH: 2.46 u[IU]/mL (ref 0.450–4.500)

## 2019-07-09 LAB — LIPID PANEL
Chol/HDL Ratio: 5.3 ratio — ABNORMAL HIGH (ref 0.0–5.0)
Cholesterol, Total: 219 mg/dL — ABNORMAL HIGH (ref 100–199)
HDL: 41 mg/dL (ref >39)
LDL Chol Calc (NIH): 142 mg/dL — ABNORMAL HIGH (ref 0–99)
Triglycerides: 199 mg/dL — ABNORMAL HIGH (ref 0–149)
VLDL Cholesterol Cal: 36 mg/dL (ref 5–40)

## 2019-07-09 LAB — MICROALBUMIN / CREATININE URINE RATIO
Creatinine, Urine: 68.8 mg/dL
Microalb/Creat Ratio: 333 mg/g creat — ABNORMAL HIGH (ref 0–29)
Microalbumin, Urine: 229 ug/mL

## 2019-07-09 LAB — T4, FREE: Free T4: 1.69 ng/dL (ref 0.82–1.77)

## 2019-07-15 ENCOUNTER — Other Ambulatory Visit: Payer: Self-pay

## 2019-07-15 ENCOUNTER — Other Ambulatory Visit (HOSPITAL_COMMUNITY): Payer: Self-pay | Admitting: Cardiovascular Disease

## 2019-07-15 ENCOUNTER — Ambulatory Visit (HOSPITAL_COMMUNITY)
Admission: RE | Admit: 2019-07-15 | Discharge: 2019-07-15 | Disposition: A | Discharge status: Home / Self Care | Payer: Medicare HMO | Source: Ambulatory Visit | Attending: Internal Medicine | Admitting: Internal Medicine

## 2019-07-15 DIAGNOSIS — I779 Disorder of arteries and arterioles, unspecified: Secondary | ICD-10-CM | POA: Diagnosis present

## 2019-07-15 DIAGNOSIS — I6523 Occlusion and stenosis of bilateral carotid arteries: Secondary | ICD-10-CM

## 2019-07-22 ENCOUNTER — Other Ambulatory Visit: Payer: Self-pay | Admitting: Family Medicine

## 2019-07-22 DIAGNOSIS — E1140 Type 2 diabetes mellitus with diabetic neuropathy, unspecified: Secondary | ICD-10-CM

## 2019-07-22 DIAGNOSIS — I10 Essential (primary) hypertension: Secondary | ICD-10-CM

## 2019-08-15 ENCOUNTER — Other Ambulatory Visit: Payer: Self-pay

## 2019-08-15 ENCOUNTER — Encounter: Payer: Self-pay | Admitting: Cardiovascular Disease

## 2019-08-15 ENCOUNTER — Ambulatory Visit: Payer: Medicare HMO | Admitting: Cardiovascular Disease

## 2019-08-15 VITALS — BP 132/68 | HR 59 | Ht 70.0 in | Wt 161.4 lb

## 2019-08-15 DIAGNOSIS — I2510 Atherosclerotic heart disease of native coronary artery without angina pectoris: Secondary | ICD-10-CM

## 2019-08-15 DIAGNOSIS — I6523 Occlusion and stenosis of bilateral carotid arteries: Secondary | ICD-10-CM | POA: Diagnosis not present

## 2019-08-15 DIAGNOSIS — I10 Essential (primary) hypertension: Secondary | ICD-10-CM

## 2019-08-15 DIAGNOSIS — E782 Mixed hyperlipidemia: Secondary | ICD-10-CM

## 2019-08-15 DIAGNOSIS — I5032 Chronic diastolic (congestive) heart failure: Secondary | ICD-10-CM

## 2019-08-15 NOTE — Patient Instructions (Addendum)
Medication Instructions:  You may use OTC Prevacid as needed for acid reflux.  Follow-Up: At [facility], you and your health needs are our priority.  As part of our continuing mission to provide you with exceptional heart care, we have created designated Provider Care Teams.  These Care Teams include your primary Cardiologist (physician) and Advanced Practice Providers (APPs -  Physician Assistants and Nurse Practitioners) who all work together to provide you with the care you need, when you need it. Your next appointment:   12 month(s) The format for your next appointment:   In Person Provider:   You may see [name], MD or one of the following Advanced Practice Providers on your designated Care Team:    Richardson Dopp, PA-C  Vin [place], [place]  Daune Perch, [place]

## 2019-08-15 NOTE — Progress Notes (Signed)
Cardiology Office Note:    Date:  08/15/2019   ID:  Sean Hartman, DOB [birth year], MRN [id number]  PCP:  Claretta Fraise, MD  Cardiologist:  [name], MD  Electrophysiologist:  None   Referring MD: Claretta Fraise, MD   Chief Complaint  Patient presents with  . Leg Pain    History of Present Illness:    Sean Hartman is a 86 y.o. male with a hx of coronary artery disease status post anterior MI in 2010 treated with PCI of the LAD.  Comorbid medical conditions include chronic occlusion of the left internal carotid artery, moderate stenosis of the right internal carotid artery, mixed hyperlipidemia, hypertension, type 2 diabetes, and statin intolerance.  The patient is here with his son today.  He has been doing remarkably well during the Covid pandemic.  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  He denies any chest pain or shortness of breath.  He does have some leg pain, especially in the right leg.  He feels like this is related to arthritis but describes the pain over his lower leg area.  He does not have calf pain.  He denies orthopnea or PND.  He said no heart palpitations.  He has been having some trouble with acid reflux when he eats certain types of food.  He takes Pepcid on a regular basis and uses Tums as needed.  Past Medical History:  Diagnosis Date  . Anemia    fe deficient  . CAD (coronary artery disease)    status post anterior wall MI November 2010  . Carotid stenosis    with chronic occlusion of LICA and moderate RICA stenosis  . Colon cancer ([place])   . DM2 (diabetes mellitus, type 2) ([place])   . Essential hypertension   . HLD (hyperlipidemia)   . Ischemic cardiomyopathy     Past Surgical History:  Procedure Laterality Date  . [employer] VITRECTOMY WITH 20 GAUGE MVR PORT Left 03/20/2017  . 25 GAUGE PARS PLANA VITRECTOMY WITH 20 GAUGE MVR  PORT Left 03/20/2017   Procedure: 25 GAUGE PARS PLANA VITRECTOMY WITH 20 GAUGE MVR PORT;  Surgeon: Hayden Pedro, MD;  Location: [facility];  Service: Ophthalmology;  Laterality: Left;  . CATARACT EXTRACTION W/ INTRAOCULAR LENS  IMPLANT, BILATERAL    . COLECTOMY    . CORONARY ANGIOPLASTY WITH STENT PLACEMENT     percutaneous transluminal coronary angioplasty and bare-metal stent to left anterior descending  . INGUINAL HERNIA REPAIR Left     Current Medications: Current Meds  Medication Sig  . amLODipine (NORVASC) 5 MG tablet TAKE 1 TABLET BY MOUTH ONCE DAILY. PLEASE KEEP UPCOMING APPOINTMENT WITH DR. Burt Knack FOR FUTURE REFILLS.  [name] aspirin (ASPIR-81) 81 MG EC tablet Take 81 mg by mouth daily.    . carvedilol (COREG) 6.25 MG tablet Take 1 tablet (6.25 mg total) by mouth 2 (two) times daily with a meal.  . famotidine (PEPCID) 20 MG tablet Take 2 tablets by mouth once daily  . furosemide (LASIX) 40 MG tablet Take 1 tablet (40 mg total) by mouth daily as needed (increased weight gain or swelling).  [name] glucose blood (ACCU-CHEK AVIVA PLUS) test strip Test BS two to three times a day  Dx E11.65  . levothyroxine (SYNTHROID) 100 MCG tablet Take 1 tablet (100 mcg total) by mouth daily.  [name] losartan (COZAAR) 100 MG  tablet TAKE 1 TABLET EVERY DAY  . nitroGLYCERIN (NITROSTAT) 0.4 MG SL tablet DISSOLVE 1 TABLET UNDER TONGUE EVERY 5 MINUTES AS NEEDED FOR CHEST PAIN UP TO 3 DOSES  . sitaGLIPtin (JANUVIA) 100 MG tablet Take 1 tablet (100 mg total) by mouth daily.  [name] tiotropium (SPIRIVA HANDIHALER) 18 MCG inhalation capsule Place 1 capsule (18 mcg total) into inhaler and inhale daily as needed (shortness of breath).     Allergies:   Patient has no known allergies.   Social History   Socioeconomic History  . Marital status: Widowed    Spouse name: Not on file  . Number of children: Not on file  . Years of education: Not on file  . Highest education level: Not on file  Occupational History  . Not on file    Tobacco Use  . Smoking status: Former Smoker    Years: 15.00    Quit date: 09/23/1998    Years since quitting: 20.9  . Smokeless tobacco: Never Used  Substance and Sexual Activity  . Alcohol use: No  . Drug use: No  . Sexual activity: Not on file  Other Topics Concern  . Not on file  Social History Narrative   Lives alone in [place]. Has a son who lives locally. Retired from working at [employer].    Social Determinants of Health   Financial Resource Strain:   . Difficulty of Paying Living Expenses:   Food Insecurity:   . Worried About Charity fundraiser in the Last Year:   . Arboriculturist in the Last Year:   Transportation Needs:   . Film/video editor (Medical):   [name] Lack of Transportation (Non-Medical):   Physical Activity:   . Days of Exercise per Week:   . Minutes of Exercise per Session:   Stress:   . Feeling of Stress :   Social Connections:   . Frequency of Communication with Friends and Family:   . Frequency of Social Gatherings with Friends and Family:   . Attends Religious Services:   . Active Member of Clubs or Organizations:   . Attends Archivist Meetings:   [name] Marital Status:      Family History: The patient's family history includes Hypertension in his father.  ROS:   Please see the history of present illness.    All other systems reviewed and are negative.  EKGs/Labs/Other Studies Reviewed:    The following studies were reviewed today: Carotid ultrasound: Summary:  Right Carotid: Velocities in the right ICA are consistent with a 60-79%         stenosis. Hemodynamically significant plaque >50%  visualized in         the CCA. The ECA appears >50% stenosed. The RICA velocities  are         elevated and have increased compared to the prior exam.   Left Carotid: Evidence consistent with a total occlusion of the left ICA.        Non-hemodynamically significant plaque <50% noted in the  CCA. The         ECA appears >50% stenosed. Chronically occluded LICA with  trickle        flow noted in the proximal/mid segment. Unable to determine  any        flow in the distal segment compared to the prior exam.   Vertebrals: Left vertebral artery demonstrates antegrade flow. Aberrant  flow        noted in the right  vertebral artery.  Subclavians: Right subclavian artery was stenotic. Bilateral subclavian  artery        flow was disturbed.          Suggest CTA to further establish severity of stenosis on  right as        velocities have increased and occluded left ICA with ?  trickle flow        PV consult following CTA   EKG:  EKG is ordered today.  The ekg ordered today demonstrates normal sinus rhythm with PVCs.  Heart rate 59 bpm, first-degree AV block, otherwise within normal limits.  Recent Labs: 07/08/2019: ALT 12; BUN 18; Creatinine, Ser 1.12; Hemoglobin 13.4; Platelets 197; Potassium 4.4; Sodium 137; TSH 2.460  Recent Lipid Panel    Component Value Date/Time   CHOL 219 (H) 07/08/2019 0950   TRIG 199 (H) 07/08/2019 0950   HDL 41 07/08/2019 0950   CHOLHDL 5.3 (H) 07/08/2019 0950   CHOLHDL 5.6 04/15/2009 0320   VLDL 16 04/15/2009 0320   LDLCALC 142 (H) 07/08/2019 0950    Physical Exam:    VS:  BP 132/68   Pulse (!) 59   Ht 5\' 10"  (1.778 m)   Wt 161 lb 6.4 oz (73.2 kg)   SpO2 96%   BMI 23.16 kg/m     Wt Readings from Last 3 Encounters:  08/15/19 161 lb 6.4 oz (73.2 kg)  07/08/19 163 lb (73.9 kg)  02/11/19 162 lb 12.8 oz (73.8 kg)     GEN:  Well nourished, well developed elderly male in no acute distress HEENT: Normal NECK: No JVD; loud right carotid bruit LYMPHATICS: No lymphadenopathy CARDIAC: RRR, no murmurs, rubs, gallops RESPIRATORY:  Clear to auscultation without rales, wheezing or rhonchi  ABDOMEN: Soft, non-tender, non-distended MUSCULOSKELETAL: 1+ bilateral ankle edema; No deformity  SKIN: Warm  and dry NEUROLOGIC:  Alert and oriented x 3 PSYCHIATRIC:  Normal affect   ASSESSMENT:    1. Coronary artery disease involving native coronary artery of native heart without angina pectoris   2. Bilateral carotid artery stenosis   3. Essential hypertension   4. Mixed hyperlipidemia   5. Chronic diastolic heart failure (HCC)    PLAN:    In order of problems listed above:  1. The patient is stable with no symptoms of angina.  He will continue on his current medical program. 2. Carotid duplex scan result noted.  He remains asymptomatic.  He has chronic occlusion of the left ICA.  Considering his advanced age of 86 years old and asymptomatic status, I am going to continue with medical therapy. 3. Blood pressure is well controlled on his current medicines 4. Statin intolerant.  Does not wish to take lipid-lowering therapy. 5. Stable without any significant functional limitation.  He will continue on furosemide as needed, carvedilol and losartan as scheduled drugs.  Overall the patient is clinically stable.  I will see him back in 1 year for follow-up evaluation.  Advised he can use Prevacid as needed for heartburn symptoms.   Medication Adjustments/Labs and Tests Ordered: Current medicines are reviewed at length with the patient today.  Concerns regarding medicines are outlined above.  Orders Placed This Encounter  Procedures  . EKG 12-Lead   No orders of the defined types were placed in this encounter.   Patient Instructions  Medication Instructions:  Your provider recommends that you continue on your current medications as directed. Please refer to the Current Medication list given to you today.   *If  you need a refill on your cardiac medications before your next appointment, please call your pharmacy*  Follow-Up: At [facility], you and your health needs are our priority.  As part of our continuing mission to provide you with exceptional heart care, we have created designated  Provider Care Teams.  These Care Teams include your primary Cardiologist (physician) and Advanced Practice Providers (APPs -  Physician Assistants and Nurse Practitioners) who all work together to provide you with the care you need, when you need it. Your next appointment:   12 month(s) The format for your next appointment:   In Person Provider:   You may see [name], MD or one of the following Advanced Practice Providers on your designated Care Team:    Richardson Dopp, PA-C  [place]in [place], PA-C  Daune Perch, [place]      Signed, [name], MD  08/15/2019 3:12 PM    [place]

## 2019-10-06 ENCOUNTER — Ambulatory Visit: Payer: Medicare HMO | Admitting: Family Medicine

## 2019-10-15 ENCOUNTER — Encounter: Payer: Self-pay | Admitting: Family Medicine

## 2019-10-15 ENCOUNTER — Other Ambulatory Visit: Payer: Self-pay

## 2019-10-15 ENCOUNTER — Ambulatory Visit (INDEPENDENT_AMBULATORY_CARE_PROVIDER_SITE_OTHER): Payer: Medicare HMO | Admitting: Family Medicine

## 2019-10-15 VITALS — BP 132/65 | HR 62 | Temp 97.9°F | Resp 20 | Ht 70.0 in | Wt 160.4 lb

## 2019-10-15 DIAGNOSIS — E1169 Type 2 diabetes mellitus with other specified complication: Secondary | ICD-10-CM | POA: Diagnosis not present

## 2019-10-15 DIAGNOSIS — D508 Other iron deficiency anemias: Secondary | ICD-10-CM

## 2019-10-15 DIAGNOSIS — E039 Hypothyroidism, unspecified: Secondary | ICD-10-CM

## 2019-10-15 DIAGNOSIS — I152 Hypertension secondary to endocrine disorders: Secondary | ICD-10-CM

## 2019-10-15 DIAGNOSIS — E1159 Type 2 diabetes mellitus with other circulatory complications: Secondary | ICD-10-CM

## 2019-10-15 DIAGNOSIS — K219 Gastro-esophageal reflux disease without esophagitis: Secondary | ICD-10-CM

## 2019-10-15 DIAGNOSIS — E785 Hyperlipidemia, unspecified: Secondary | ICD-10-CM

## 2019-10-15 DIAGNOSIS — I5032 Chronic diastolic (congestive) heart failure: Secondary | ICD-10-CM

## 2019-10-15 DIAGNOSIS — J449 Chronic obstructive pulmonary disease, unspecified: Secondary | ICD-10-CM

## 2019-10-15 DIAGNOSIS — I2510 Atherosclerotic heart disease of native coronary artery without angina pectoris: Secondary | ICD-10-CM

## 2019-10-15 DIAGNOSIS — I10 Essential (primary) hypertension: Secondary | ICD-10-CM

## 2019-10-15 LAB — BAYER DCA HB A1C WAIVED: HB A1C (BAYER DCA - WAIVED): 8.2 % — ABNORMAL HIGH (ref <7.0)

## 2019-10-15 MED ORDER — BLOOD GLUCOSE MONITOR KIT: PACK | 0 refills | Status: DC

## 2019-10-15 NOTE — Progress Notes (Signed)
 Subjective:  Patient ID: Sean Hartman,  male    DOB: 10/24/1922  Age: 86 y.o.    CC: Medical Management of Chronic Issues   HPI Sean Hartman presents for  follow-up of hypertension. Patient has no history of headache chest pain or shortness of breath or recent cough. Patient also denies symptoms of TIA such as numbness weakness lateralizing. Patient denies side effects from medication. States taking it regularly.  Patient also  in for follow-up of elevated cholesterol. Doing well without complaints on current medication. Denies side effects  including myalgia and arthralgia and nausea. Also in today for liver function testing. Currently no chest pain, shortness of breath or other cardiovascular related symptoms noted.  Follow-up of diabetes. Patient does check blood sugar at home. Readings run between 100 and 150 Patient denies symptoms such as excessive hunger or urinary frequency, excessive hunger, nausea No significant hypoglycemic spells noted. Medications reviewed. Pt reports taking them regularly. Pt. denies complication/adverse reaction today.   Patient presents for follow-up on  thyroid. The patient has a history of hypothyroidism for many years. It has been stable recently. Pt. denies any change in  voice, loss of hair, heat or cold intolerance. Energy level has been adequate to good. Patient denies constipation and diarrhea. No myxedema. Medication is as noted below. Verified that pt is taking it daily on an empty stomach. Well tolerated.  Patient has roughened surfaces to his toenails he would like to have something to soften them.     History Sean Hartman has a past medical history of Anemia, CAD (coronary artery disease), Carotid stenosis, Colon cancer (HCC), DM2 (diabetes mellitus, type 2) (HCC), Essential hypertension, HLD (hyperlipidemia), and Ischemic cardiomyopathy.   He has a past surgical history that includes Cataract extraction w/ intraocular lens  implant,  bilateral; Colectomy; Coronary angioplasty with stent; Inguinal hernia repair (Left); 25 gauge pars plana vitrectomy with 20 gauge mvr port (Left, 03/20/2017); and 25 gauge pars plana vitrectomy with 20 gauge mvr port (Left, 03/20/2017).   His family history includes Hypertension in his father.He reports that he quit smoking about 21 years ago. He quit after 15.00 years of use. He has never used smokeless tobacco. He reports that he does not drink alcohol or use drugs.  Current Outpatient Medications on File Prior to Visit  Medication Sig Dispense Refill  . amLODipine (NORVASC) 5 MG tablet TAKE 1 TABLET BY MOUTH ONCE DAILY. PLEASE KEEP UPCOMING APPOINTMENT WITH DR. COOPER FOR FUTURE REFILLS. 90 tablet 0  . aspirin (ASPIR-81) 81 MG EC tablet Take 81 mg by mouth daily.      . carvedilol (COREG) 6.25 MG tablet Take 1 tablet (6.25 mg total) by mouth 2 (two) times daily with a meal. 180 tablet 0  . famotidine (PEPCID) 20 MG tablet Take 2 tablets by mouth once daily 180 tablet 1  . furosemide (LASIX) 40 MG tablet Take 1 tablet (40 mg total) by mouth daily as needed (increased weight gain or swelling). 90 tablet 1  . glucose blood (ACCU-CHEK AVIVA PLUS) test strip Test BS two to three times a day  Dx E11.65 300 each 3  . levothyroxine (SYNTHROID) 100 MCG tablet Take 1 tablet (100 mcg total) by mouth daily. 90 tablet 0  . losartan (COZAAR) 100 MG tablet TAKE 1 TABLET EVERY DAY 90 tablet 0  . nitroGLYCERIN (NITROSTAT) 0.4 MG SL tablet DISSOLVE 1 TABLET UNDER TONGUE EVERY 5 MINUTES AS NEEDED FOR CHEST PAIN UP TO 3 DOSES 25 tablet 3  .   sitaGLIPtin (JANUVIA) 100 MG tablet Take 1 tablet (100 mg total) by mouth daily. 90 tablet 1  . tiotropium (SPIRIVA HANDIHALER) 18 MCG inhalation capsule Place 1 capsule (18 mcg total) into inhaler and inhale daily as needed (shortness of breath). 90 capsule 3   No current facility-administered medications on file prior to visit.    ROS Review of Systems  Constitutional:  Negative.   HENT: Negative.   Eyes: Negative for visual disturbance.  Respiratory: Negative for cough and shortness of breath.   Cardiovascular: Negative for chest pain and leg swelling.  Gastrointestinal: Negative for abdominal pain, diarrhea, nausea and vomiting.  Genitourinary: Negative for difficulty urinating.  Musculoskeletal: Negative for arthralgias and myalgias.  Skin: Negative for rash.  Neurological: Negative for headaches.  Psychiatric/Behavioral: Negative for sleep disturbance.    Objective:  BP 132/65   Pulse 62   Temp 97.9 F (36.6 C) (Temporal)   Resp 20   Ht 5' 10" (1.778 m)   Wt 160 lb 6 oz (72.7 kg)   SpO2 99%   BMI 23.01 kg/m   BP Readings from Last 3 Encounters:  10/15/19 132/65  08/15/19 132/68  07/08/19 137/77    Wt Readings from Last 3 Encounters:  10/15/19 160 lb 6 oz (72.7 kg)  08/15/19 161 lb 6.4 oz (73.2 kg)  07/08/19 163 lb (73.9 kg)     Physical Exam Vitals reviewed.  Constitutional:      Appearance: He is well-developed.  HENT:     Head: Normocephalic and atraumatic.     Right Ear: Tympanic membrane and external ear normal. No decreased hearing noted.     Left Ear: Tympanic membrane and external ear normal. No decreased hearing noted.     Mouth/Throat:     Pharynx: No oropharyngeal exudate or posterior oropharyngeal erythema.  Eyes:     Pupils: Pupils are equal, round, and reactive to light.  Cardiovascular:     Rate and Rhythm: Normal rate and regular rhythm.     Heart sounds: No murmur.  Pulmonary:     Effort: No respiratory distress.     Breath sounds: Normal breath sounds.  Abdominal:     General: Bowel sounds are normal.     Palpations: Abdomen is soft. There is no mass.     Tenderness: There is no abdominal tenderness.  Musculoskeletal:     Cervical back: Normal range of motion and neck supple.     Diabetic Foot Exam - Simple   Simple Foot Form Diabetic Foot exam was performed with the following findings: Yes  10/15/2019  2:52 PM  Visual Inspection No deformities, no ulcerations, no other skin breakdown bilaterally: Yes See comments: Yes Sensation Testing Intact to touch and monofilament testing bilaterally: Yes Pulse Check Posterior Tibialis and Dorsalis pulse intact bilaterally: Yes Comments Noted is onycholysis of the toenails. Right 1st has jagged edge.       Assessment & Plan:   Sean Hartman was seen today for medical management of chronic issues.  Diagnoses and all orders for this visit:  Acquired hypothyroidism -     CBC with Differential/Platelet -     CMP14+EGFR -     Lipid panel -     Thyroid Panel With TSH  Essential hypertension -     CBC with Differential/Platelet -     CMP14+EGFR -     Lipid panel  DM type 2 with diabetic dyslipidemia (HCC) -     Bayer DCA Hb A1c Waived -  CBC with Differential/Platelet -     CMP14+EGFR -     Lipid panel  Atherosclerosis of coronary artery of native heart without angina pectoris, unspecified vessel or lesion type  Chronic diastolic heart failure (HCC)  Gastroesophageal reflux disease without esophagitis  Other iron deficiency anemia  Chronic obstructive pulmonary disease, unspecified COPD type (HCC)  Hypertension associated with diabetes (HCC)  Other orders -     blood glucose meter kit and supplies KIT; Dispense based on  insurance coverage. Use twice daily as directed. (FOR ICD-10 : E11.9  Sean Hartman is agreeable to seeing podiatry, Dr. Cody Drake for management of his onychomycosis and some calluses that were noted on his feet as well.  I am having Sean Hartman start on blood glucose meter kit and supplies. I am also having him maintain his aspirin, nitroGLYCERIN, Accu-Chek Aviva Plus, Spiriva HandiHaler, famotidine, furosemide, levothyroxine, carvedilol, amLODipine, sitaGLIPtin, and losartan.  Meds ordered this encounter  Medications  . blood glucose meter kit and supplies KIT    Sig: Dispense based on   insurance coverage. Use twice daily as directed. (FOR ICD-10 : E11.9    Dispense:  1 each    Refill:  0    Order Specific Question:   Number of strips    Answer:   100    Order Specific Question:   Number of lancets    Answer:   100  Currently his cardiac condition is stable and that he is having no chest pain or swelling.  He does have a fluid medicine he takes as needed.  With regard to his COPD he has a rescue inhaler he uses every 3 or 4 weeks or even less.  Other than that he denies dyspnea.   Follow-up: Return in about 3 months (around 01/15/2020).   , M.D. 

## 2019-10-16 LAB — CMP14+EGFR
ALT: 13 IU/L (ref 0–44)
AST: 21 IU/L (ref 0–40)
Albumin/Globulin Ratio: 1.5 (ref 1.2–2.2)
Albumin: 4.1 g/dL (ref 3.5–4.6)
Alkaline Phosphatase: 73 IU/L (ref 39–117)
BUN/Creatinine Ratio: 15 (ref 10–24)
BUN: 17 mg/dL (ref 10–36)
Bilirubin Total: 0.3 mg/dL (ref 0.0–1.2)
CO2: 25 mmol/L (ref 20–29)
Calcium: 9.5 mg/dL (ref 8.6–10.2)
Chloride: 101 mmol/L (ref 96–106)
Creatinine, Ser: 1.12 mg/dL (ref 0.76–1.27)
GFR calc Af Amer: 65 mL/min/{1.73_m2} (ref >59)
GFR calc non Af Amer: 56 mL/min/{1.73_m2} — ABNORMAL LOW (ref >59)
Globulin, Total: 2.7 g/dL (ref 1.5–4.5)
Glucose: 146 mg/dL — ABNORMAL HIGH (ref 65–99)
Potassium: 4.1 mmol/L (ref 3.5–5.2)
Sodium: 137 mmol/L (ref 134–144)
Total Protein: 6.8 g/dL (ref 6.0–8.5)

## 2019-10-16 LAB — LIPID PANEL
Chol/HDL Ratio: 5.8 ratio — ABNORMAL HIGH (ref 0.0–5.0)
Cholesterol, Total: 209 mg/dL — ABNORMAL HIGH (ref 100–199)
HDL: 36 mg/dL — ABNORMAL LOW (ref >39)
LDL Chol Calc (NIH): 131 mg/dL — ABNORMAL HIGH (ref 0–99)
Triglycerides: 233 mg/dL — ABNORMAL HIGH (ref 0–149)
VLDL Cholesterol Cal: 42 mg/dL — ABNORMAL HIGH (ref 5–40)

## 2019-10-16 LAB — CBC WITH DIFFERENTIAL/PLATELET
Basophils Absolute: 0 10*3/uL (ref 0.0–0.2)
Basos: 1 %
EOS (ABSOLUTE): 0.3 10*3/uL (ref 0.0–0.4)
Eos: 6 %
Hematocrit: 40.3 % (ref 37.5–51.0)
Hemoglobin: 13.5 g/dL (ref 13.0–17.7)
Immature Grans (Abs): 0 10*3/uL (ref 0.0–0.1)
Immature Granulocytes: 0 %
Lymphocytes Absolute: 0.9 10*3/uL (ref 0.7–3.1)
Lymphs: 20 %
MCH: 30.8 pg (ref 26.6–33.0)
MCHC: 33.5 g/dL (ref 31.5–35.7)
MCV: 92 fL (ref 79–97)
Monocytes Absolute: 0.6 10*3/uL (ref 0.1–0.9)
Monocytes: 12 %
Neutrophils Absolute: 2.7 10*3/uL (ref 1.4–7.0)
Neutrophils: 61 %
Platelets: 202 10*3/uL (ref 150–450)
RBC: 4.38 x10E6/uL (ref 4.14–5.80)
RDW: 12.9 % (ref 11.6–15.4)
WBC: 4.5 10*3/uL (ref 3.4–10.8)

## 2019-10-16 LAB — THYROID PANEL WITH TSH
Free Thyroxine Index: 2.5 (ref 1.2–4.9)
T3 Uptake Ratio: 32 % (ref 24–39)
T4, Total: 7.7 ug/dL (ref 4.5–12.0)
TSH: 1.61 u[IU]/mL (ref 0.450–4.500)

## 2019-10-21 ENCOUNTER — Other Ambulatory Visit: Payer: Self-pay

## 2019-10-21 ENCOUNTER — Other Ambulatory Visit: Payer: Self-pay | Admitting: *Deleted

## 2019-10-21 ENCOUNTER — Telehealth: Payer: Self-pay | Admitting: Family Medicine

## 2019-10-21 DIAGNOSIS — E785 Hyperlipidemia, unspecified: Secondary | ICD-10-CM

## 2019-10-21 MED ORDER — BLOOD GLUCOSE MONITOR KIT: PACK | 0 refills | Status: DC

## 2019-10-21 NOTE — Telephone Encounter (Signed)
Order was on print - do you know if it was faxed during visit?

## 2019-10-21 NOTE — Telephone Encounter (Signed)
Meter kit and supplies order signed by [employer] and faxed to [employer] order today

## 2019-10-21 NOTE — Telephone Encounter (Signed)
Please reprint and fax

## 2019-11-05 ENCOUNTER — Other Ambulatory Visit: Payer: Self-pay | Admitting: Family Medicine

## 2019-11-05 DIAGNOSIS — E1140 Type 2 diabetes mellitus with diabetic neuropathy, unspecified: Secondary | ICD-10-CM

## 2019-11-05 DIAGNOSIS — I10 Essential (primary) hypertension: Secondary | ICD-10-CM

## 2019-11-06 ENCOUNTER — Other Ambulatory Visit: Payer: Self-pay | Admitting: *Deleted

## 2019-11-06 MED ORDER — ACCU-CHEK GUIDE CONTROL VI LIQD: 0 refills | Status: DC

## 2019-11-12 ENCOUNTER — Telehealth: Payer: Self-pay | Admitting: Cardiovascular Disease

## 2019-11-12 DIAGNOSIS — I10 Essential (primary) hypertension: Secondary | ICD-10-CM

## 2019-11-12 DIAGNOSIS — E1140 Type 2 diabetes mellitus with diabetic neuropathy, unspecified: Secondary | ICD-10-CM

## 2019-11-12 MED ORDER — LOSARTAN POTASSIUM 100 MG PO TABS: 100.0000 mg | ORAL_TABLET | Freq: Every day | ORAL | 2 refills | Status: DC

## 2019-11-12 NOTE — Telephone Encounter (Signed)
*  STAT* If patient is at the pharmacy, call can be transferred to refill team.   1. Which medications need to be refilled? (please list name of each medication and dose if known)  losartan (COZAAR) 100 MG tablet  2. Which pharmacy/location (including street and city if local pharmacy) is medication to be sent to?  [place], [place]   3. Do they need a 30 day or 90 day supply? 90 with refills

## 2019-11-12 NOTE — Telephone Encounter (Signed)
Pt's medication was sent to pt's pharmacy as requested. Confirmation received.  °

## 2020-01-19 ENCOUNTER — Encounter: Payer: Self-pay | Admitting: Family Medicine

## 2020-01-19 ENCOUNTER — Other Ambulatory Visit: Payer: Self-pay

## 2020-01-19 ENCOUNTER — Ambulatory Visit (INDEPENDENT_AMBULATORY_CARE_PROVIDER_SITE_OTHER): Payer: Medicare HMO | Admitting: Family Medicine

## 2020-01-19 VITALS — BP 132/64 | HR 52 | Temp 97.3°F | Resp 20 | Ht 70.0 in | Wt 161.0 lb

## 2020-01-19 DIAGNOSIS — I10 Essential (primary) hypertension: Secondary | ICD-10-CM | POA: Diagnosis not present

## 2020-01-19 DIAGNOSIS — I25118 Atherosclerotic heart disease of native coronary artery with other forms of angina pectoris: Secondary | ICD-10-CM | POA: Diagnosis not present

## 2020-01-19 DIAGNOSIS — E785 Hyperlipidemia, unspecified: Secondary | ICD-10-CM | POA: Diagnosis not present

## 2020-01-19 DIAGNOSIS — E039 Hypothyroidism, unspecified: Secondary | ICD-10-CM

## 2020-01-19 DIAGNOSIS — D508 Other iron deficiency anemias: Secondary | ICD-10-CM | POA: Diagnosis not present

## 2020-01-19 DIAGNOSIS — E1140 Type 2 diabetes mellitus with diabetic neuropathy, unspecified: Secondary | ICD-10-CM

## 2020-01-19 DIAGNOSIS — Z23 Encounter for immunization: Secondary | ICD-10-CM

## 2020-01-19 DIAGNOSIS — I255 Ischemic cardiomyopathy: Secondary | ICD-10-CM

## 2020-01-19 DIAGNOSIS — K219 Gastro-esophageal reflux disease without esophagitis: Secondary | ICD-10-CM

## 2020-01-19 DIAGNOSIS — E1169 Type 2 diabetes mellitus with other specified complication: Secondary | ICD-10-CM

## 2020-01-19 DIAGNOSIS — E1149 Type 2 diabetes mellitus with other diabetic neurological complication: Secondary | ICD-10-CM

## 2020-01-19 LAB — BAYER DCA HB A1C WAIVED: HB A1C (BAYER DCA - WAIVED): 7.3 % — ABNORMAL HIGH (ref <7.0)

## 2020-01-19 NOTE — Progress Notes (Signed)
Subjective:  Patient ID: Sean Hartman,  male    DOB: 21-Feb-1923  Age: 86 y.o.    CC: Medical Management of Chronic Issues   HPI THANE AGE presents for  follow-up of hypertension. Patient has no history of headache chest pain or shortness of breath or recent cough. Patient also denies symptoms of TIA such as numbness weakness lateralizing. Patient denies side effects from medication. States taking it regularly.  Patient also  in for follow-up of elevated cholesterol. Doing well without complaints on current medication. Denies side effects  including myalgia and arthralgia and nausea. Also in today for liver function testing. Currently no chest pain, shortness of breath or other cardiovascular related symptoms noted.  Follow-up of diabetes. Patient does check blood sugar at home. None too high or low.  Patient denies symptoms such as excessive hunger or urinary frequency, excessive hunger, nausea No significant hypoglycemic spells noted. Medications reviewed. Pt reports taking them regularly. Pt. denies complication/adverse reaction today.    History Rishabh has a past medical history of Anemia, CAD (coronary artery disease), Carotid stenosis, Colon cancer ([place]), DM2 (diabetes mellitus, type 2) ([place]), Essential hypertension, HLD (hyperlipidemia), Ischemic cardiomyopathy, and Status post partial colectomy (11/19/2015).   He has a past surgical history that includes Cataract extraction w/ intraocular lens  implant, bilateral; Colectomy; Coronary angioplasty with stent; Inguinal hernia repair (Left); 25 gauge pars plana vitrectomy with 20 gauge mvr port (Left, 03/20/2017); and 25 gauge pars plana vitrectomy with 20 gauge mvr port (Left, 03/20/2017).   His family history includes Hypertension in his father.He reports that he quit smoking about 21 years ago. He quit after 15.00 years of use. He has never used smokeless tobacco. He reports that he does not drink alcohol and does not use  drugs.  Current Outpatient Medications on File Prior to Visit  Medication Sig Dispense Refill  . amLODipine (NORVASC) 5 MG tablet TAKE 1 TABLET BY MOUTH ONCE DAILY. 90 tablet 1  . aspirin (ASPIR-81) 81 MG EC tablet Take 81 mg by mouth daily.      . Blood Glucose Calibration (ACCU-CHEK GUIDE CONTROL) LIQD Use as directed with meter Dx E11.40 3 each 0  . blood glucose meter kit and supplies KIT Dispense based on  insurance coverage. Use twice daily as directed. (FOR ICD-10 : E11.9 1 each 0  . carvedilol (COREG) 6.25 MG tablet TAKE 1 TABLET TWICE DAILY WITH MEALS 180 tablet 1  . famotidine (PEPCID) 20 MG tablet TAKE 2 TABLETS BY MOUTH ONCE DAILY 180 tablet 1  . furosemide (LASIX) 40 MG tablet Take 1 tablet (40 mg total) by mouth daily as needed (increased weight gain or swelling). 90 tablet 1  . glucose blood (ACCU-CHEK AVIVA PLUS) test strip Test BS two to three times a day  Dx E11.65 300 each 3  . levothyroxine (SYNTHROID) 100 MCG tablet Take 1 tablet (100 mcg total) by mouth daily. 90 tablet 0  . losartan (COZAAR) 100 MG tablet Take 1 tablet (100 mg total) by mouth daily. 90 tablet 2  . nitroGLYCERIN (NITROSTAT) 0.4 MG SL tablet DISSOLVE 1 TABLET UNDER TONGUE EVERY 5 MINUTES AS NEEDED FOR CHEST PAIN UP TO 3 DOSES 25 tablet 3  . sitaGLIPtin (JANUVIA) 100 MG tablet Take 1 tablet (100 mg total) by mouth daily. 90 tablet 1  . tiotropium (SPIRIVA HANDIHALER) 18 MCG inhalation capsule Place 1 capsule (18 mcg total) into inhaler and inhale daily as needed (shortness of breath). 90 capsule 3  No current facility-administered medications on file prior to visit.    ROS Review of Systems  Constitutional: Negative for fever.  Respiratory: Negative for shortness of breath.   Cardiovascular: Negative for chest pain.  Musculoskeletal: Negative for arthralgias.  Skin: Negative for rash.    Objective:  BP 132/64   Pulse (!) 52   Temp (!) 97.3 F (36.3 C) (Temporal)   Resp 20   Ht '5\' 10"'$  (1.778 m)    Wt 161 lb (73 kg)   SpO2 97%   BMI 23.10 kg/m   BP Readings from Last 3 Encounters:  01/19/20 132/64  10/15/19 132/65  08/15/19 132/68    Wt Readings from Last 3 Encounters:  01/19/20 161 lb (73 kg)  10/15/19 160 lb 6 oz (72.7 kg)  08/15/19 161 lb 6.4 oz (73.2 kg)     Physical Exam Vitals reviewed.  Constitutional:      Appearance: He is well-developed.  HENT:     Head: Normocephalic and atraumatic.     Right Ear: Tympanic membrane and external ear normal. No decreased hearing noted.     Left Ear: Tympanic membrane and external ear normal. No decreased hearing noted.     Mouth/Throat:     Pharynx: No oropharyngeal exudate or posterior oropharyngeal erythema.  Eyes:     Pupils: Pupils are equal, round, and reactive to light.  Cardiovascular:     Rate and Rhythm: Normal rate and regular rhythm.     Heart sounds: No murmur heard.   Pulmonary:     Effort: No respiratory distress.     Breath sounds: Normal breath sounds.  Abdominal:     General: Bowel sounds are normal.     Palpations: Abdomen is soft. There is no mass.     Tenderness: There is no abdominal tenderness.  Musculoskeletal:     Cervical back: Normal range of motion and neck supple.         Assessment & Plan:   Aul was seen today for medical management of chronic issues.  Diagnoses and all orders for this visit:  DM type 2 with diabetic dyslipidemia ([place]) -     Bayer DCA Hb A1c Waived -     CBC with Differential/Platelet -     CMP14+EGFR -     Lipid panel  Acquired hypothyroidism -     CBC with Differential/Platelet -     CMP14+EGFR -     Lipid panel -     Thyroid Panel With TSH  Essential hypertension -     CBC with Differential/Platelet -     CMP14+EGFR -     Lipid panel  Ischemic cardiomyopathy  Diabetic neuropathy with neurologic complication (HCC)  Gastroesophageal reflux disease without esophagitis  Other iron deficiency anemia  Coronary artery disease of native artery  of native heart with stable angina pectoris ([place])  Other orders -     Tdap vaccine greater than or equal to 7yo IM   I am having Nikki Dom. Kurtz maintain his aspirin, nitroGLYCERIN, Accu-Chek Aviva Plus, Spiriva HandiHaler, furosemide, levothyroxine, sitaGLIPtin, blood glucose meter kit and supplies, amLODipine, carvedilol, famotidine, Accu-Chek Guide Control, and losartan.  No orders of the defined types were placed in this encounter.    Follow-up: Return in about 3 months (around 04/20/2020).  Claretta Fraise, M.D.

## 2020-01-20 LAB — CMP14+EGFR
ALT: 12 IU/L (ref 0–44)
AST: 20 IU/L (ref 0–40)
Albumin/Globulin Ratio: 1.5 (ref 1.2–2.2)
Albumin: 4 g/dL (ref 3.5–4.6)
Alkaline Phosphatase: 68 IU/L (ref 48–121)
BUN/Creatinine Ratio: 16 (ref 10–24)
BUN: 20 mg/dL (ref 10–36)
Bilirubin Total: 0.6 mg/dL (ref 0.0–1.2)
CO2: 24 mmol/L (ref 20–29)
Calcium: 9.3 mg/dL (ref 8.6–10.2)
Chloride: 101 mmol/L (ref 96–106)
Creatinine, Ser: 1.24 mg/dL (ref 0.76–1.27)
GFR calc Af Amer: 57 mL/min/{1.73_m2} — ABNORMAL LOW (ref >59)
GFR calc non Af Amer: 49 mL/min/{1.73_m2} — ABNORMAL LOW (ref >59)
Globulin, Total: 2.7 g/dL (ref 1.5–4.5)
Glucose: 218 mg/dL — ABNORMAL HIGH (ref 65–99)
Potassium: 4.3 mmol/L (ref 3.5–5.2)
Sodium: 139 mmol/L (ref 134–144)
Total Protein: 6.7 g/dL (ref 6.0–8.5)

## 2020-01-20 LAB — CBC WITH DIFFERENTIAL/PLATELET
Basophils Absolute: 0.1 10*3/uL (ref 0.0–0.2)
Basos: 1 %
EOS (ABSOLUTE): 0.2 10*3/uL (ref 0.0–0.4)
Eos: 4 %
Hematocrit: 39.9 % (ref 37.5–51.0)
Hemoglobin: 13.8 g/dL (ref 13.0–17.7)
Immature Grans (Abs): 0 10*3/uL (ref 0.0–0.1)
Immature Granulocytes: 0 %
Lymphocytes Absolute: 0.8 10*3/uL (ref 0.7–3.1)
Lymphs: 16 %
MCH: 32.6 pg (ref 26.6–33.0)
MCHC: 34.6 g/dL (ref 31.5–35.7)
MCV: 94 fL (ref 79–97)
Monocytes Absolute: 0.5 10*3/uL (ref 0.1–0.9)
Monocytes: 10 %
Neutrophils Absolute: 3.6 10*3/uL (ref 1.4–7.0)
Neutrophils: 69 %
Platelets: 176 10*3/uL (ref 150–450)
RBC: 4.23 x10E6/uL (ref 4.14–5.80)
RDW: 12.5 % (ref 11.6–15.4)
WBC: 5.2 10*3/uL (ref 3.4–10.8)

## 2020-01-20 LAB — LIPID PANEL
Chol/HDL Ratio: 5.3 ratio — ABNORMAL HIGH (ref 0.0–5.0)
Cholesterol, Total: 208 mg/dL — ABNORMAL HIGH (ref 100–199)
HDL: 39 mg/dL — ABNORMAL LOW (ref >39)
LDL Chol Calc (NIH): 142 mg/dL — ABNORMAL HIGH (ref 0–99)
Triglycerides: 151 mg/dL — ABNORMAL HIGH (ref 0–149)
VLDL Cholesterol Cal: 27 mg/dL (ref 5–40)

## 2020-01-20 LAB — THYROID PANEL WITH TSH
Free Thyroxine Index: 2.7 (ref 1.2–4.9)
T3 Uptake Ratio: 33 % (ref 24–39)
T4, Total: 8.3 ug/dL (ref 4.5–12.0)
TSH: 2.01 u[IU]/mL (ref 0.450–4.500)

## 2020-01-27 NOTE — Progress Notes (Signed)
Hello Mearle,  Your lab result is normal and/or stable.Some minor variations that are not significant are commonly marked abnormal, but do not represent any medical problem for you.  Best regards, Don Tiu, M.D.

## 2020-01-29 ENCOUNTER — Other Ambulatory Visit: Payer: Self-pay | Admitting: Family Medicine

## 2020-01-29 DIAGNOSIS — E1140 Type 2 diabetes mellitus with diabetic neuropathy, unspecified: Secondary | ICD-10-CM

## 2020-01-29 DIAGNOSIS — E1169 Type 2 diabetes mellitus with other specified complication: Secondary | ICD-10-CM

## 2020-01-29 DIAGNOSIS — E039 Hypothyroidism, unspecified: Secondary | ICD-10-CM

## 2020-03-11 ENCOUNTER — Other Ambulatory Visit: Payer: Self-pay | Admitting: Family Medicine

## 2020-03-11 DIAGNOSIS — E039 Hypothyroidism, unspecified: Secondary | ICD-10-CM

## 2020-04-22 ENCOUNTER — Other Ambulatory Visit: Payer: Self-pay

## 2020-04-22 ENCOUNTER — Ambulatory Visit (INDEPENDENT_AMBULATORY_CARE_PROVIDER_SITE_OTHER): Payer: Medicare HMO | Admitting: Family Medicine

## 2020-04-22 ENCOUNTER — Encounter: Payer: Self-pay | Admitting: Family Medicine

## 2020-04-22 VITALS — BP 137/67 | HR 56 | Temp 97.1°F | Ht 70.0 in | Wt 158.4 lb

## 2020-04-22 DIAGNOSIS — I152 Hypertension secondary to endocrine disorders: Secondary | ICD-10-CM

## 2020-04-22 DIAGNOSIS — I255 Ischemic cardiomyopathy: Secondary | ICD-10-CM

## 2020-04-22 DIAGNOSIS — I10 Essential (primary) hypertension: Secondary | ICD-10-CM

## 2020-04-22 DIAGNOSIS — E1159 Type 2 diabetes mellitus with other circulatory complications: Secondary | ICD-10-CM | POA: Diagnosis not present

## 2020-04-22 DIAGNOSIS — E1149 Type 2 diabetes mellitus with other diabetic neurological complication: Secondary | ICD-10-CM

## 2020-04-22 DIAGNOSIS — E1140 Type 2 diabetes mellitus with diabetic neuropathy, unspecified: Secondary | ICD-10-CM | POA: Diagnosis not present

## 2020-04-22 DIAGNOSIS — E1169 Type 2 diabetes mellitus with other specified complication: Secondary | ICD-10-CM

## 2020-04-22 DIAGNOSIS — K219 Gastro-esophageal reflux disease without esophagitis: Secondary | ICD-10-CM | POA: Diagnosis not present

## 2020-04-22 DIAGNOSIS — Z23 Encounter for immunization: Secondary | ICD-10-CM | POA: Diagnosis not present

## 2020-04-22 DIAGNOSIS — E785 Hyperlipidemia, unspecified: Secondary | ICD-10-CM | POA: Diagnosis not present

## 2020-04-22 DIAGNOSIS — E039 Hypothyroidism, unspecified: Secondary | ICD-10-CM

## 2020-04-22 LAB — BAYER DCA HB A1C WAIVED: HB A1C (BAYER DCA - WAIVED): 7.4 % — ABNORMAL HIGH (ref <7.0)

## 2020-04-22 MED ORDER — SPIRIVA HANDIHALER 18 MCG IN CAPS
18.0000 ug | ORAL_CAPSULE | Freq: Every day | RESPIRATORY_TRACT | 3 refills | Status: DC | PRN
Start: 2020-04-22 — End: 2022-02-14

## 2020-04-22 MED ORDER — SITAGLIPTIN PHOSPHATE 100 MG PO TABS: 100.0000 mg | ORAL_TABLET | Freq: Every day | ORAL | 0 refills | Status: DC

## 2020-04-22 NOTE — Progress Notes (Signed)
Subjective:  Patient ID: Sean Hartman,  male    DOB: 1923-05-21  Age: 86 y.o.    CC: Follow-up (3 Month)   HPI TAEVEON KEESLING presents for  follow-up of hypertension. Patient has no history of headache chest pain or shortness of breath or recent cough. Patient also denies symptoms of TIA such as numbness weakness lateralizing. Patient denies side effects from medication. States taking it regularly.  Patient also  in for follow-up of elevated cholesterol. Doing well without complaints on current medication. Denies side effects  including myalgia and arthralgia and nausea. Also in today for liver function testing. Currently no chest pain, shortness of breath or other cardiovascular related symptoms noted.  Follow-up of diabetes. Patient does check blood sugar at home. Readings run around 137 fasting.  Patient denies symptoms such as excessive hunger or urinary frequency, excessive hunger, nausea No significant hypoglycemic spells noted. Medications reviewed. Pt reports taking them regularly. Pt. denies complication/adverse reaction today.   PAtient presents for follow-up on  thyroid. The patient has a history of hypothyroidism for many years. It has been stable recently. Pt. denies any change in  voice, loss of hair, heat or cold intolerance. Energy level has been adequate to good. Patient denies constipation and diarrhea. No myxedema. Medication is as noted below. Verified that pt is taking it daily on an empty stomach. Well tolerated.  Patient in for follow-up of GERD. Currently asymptomatic taking famotidine  daily. There is no chest pain or heartburn. No hematemesis and no melena. No dysphagia or choking. Onset is remote. Progression is stable. Complicating factors, none.  History Greysin has a past medical history of Anemia, CAD (coronary artery disease), Carotid stenosis, Colon cancer ([place]), DM2 (diabetes mellitus, type 2) ([place]), Essential hypertension, HLD (hyperlipidemia),  Ischemic cardiomyopathy, and Status post partial colectomy (11/19/2015).   He has a past surgical history that includes Cataract extraction w/ intraocular lens  implant, bilateral; Colectomy; Coronary angioplasty with stent; Inguinal hernia repair (Left); 25 gauge pars plana vitrectomy with 20 gauge mvr port (Left, 03/20/2017); and 25 gauge pars plana vitrectomy with 20 gauge mvr port (Left, 03/20/2017).   His family history includes Hypertension in his father.He reports that he quit smoking about 21 years ago. He quit after 15.00 years of use. He has never used smokeless tobacco. He reports that he does not drink alcohol and does not use drugs.  Current Outpatient Medications on File Prior to Visit  Medication Sig Dispense Refill  . ACCU-CHEK GUIDE test strip TEST BLOOD SUGAR TWICE DAILY AS DIRECTED 100 strip 11  . amLODipine (NORVASC) 5 MG tablet TAKE 1 TABLET EVERY DAY 90 tablet 1  . aspirin (ASPIR-81) 81 MG EC tablet Take 81 mg by mouth daily.      . Blood Glucose Calibration (ACCU-CHEK GUIDE CONTROL) LIQD Use as directed with meter Dx E11.40 3 each 0  . blood glucose meter kit and supplies KIT Dispense based on  insurance coverage. Use twice daily as directed. (FOR ICD-10 : E11.9 1 each 0  . carvedilol (COREG) 6.25 MG tablet TAKE 1 TABLET TWICE DAILY WITH MEALS 180 tablet 1  . famotidine (PEPCID) 20 MG tablet TAKE 2 TABLETS BY MOUTH ONCE DAILY 180 tablet 1  . furosemide (LASIX) 40 MG tablet TAKE 1 TABLET EVERY DAY AS NEEDED  (INCREASED  WEIGHT  GAIN OR SWELLING) 90 tablet 1  . levothyroxine (SYNTHROID) 100 MCG tablet TAKE 1 TABLET (100 MCG TOTAL) BY MOUTH DAILY. 90 tablet 3  .  losartan (COZAAR) 100 MG tablet Take 1 tablet (100 mg total) by mouth daily. 90 tablet 2  . nitroGLYCERIN (NITROSTAT) 0.4 MG SL tablet DISSOLVE 1 TABLET UNDER TONGUE EVERY 5 MINUTES AS NEEDED FOR CHEST PAIN UP TO 3 DOSES (Patient not taking: Reported on 04/22/2020) 25 tablet 3   No current facility-administered  medications on file prior to visit.    ROS Review of Systems  Constitutional: Negative for fever.  Respiratory: Negative for shortness of breath.   Cardiovascular: Negative for chest pain.  Musculoskeletal: Negative for arthralgias.  Skin: Negative for rash.    Objective:  BP 137/67   Pulse (!) 56   Temp (!) 97.1 F (36.2 C) (Temporal)   Ht $R'5\' 10"'Ic$  (1.778 m)   Wt 158 lb 6.4 oz (71.8 kg)   BMI 22.73 kg/m   BP Readings from Last 3 Encounters:  04/22/20 137/67  01/19/20 132/64  10/15/19 132/65    Wt Readings from Last 3 Encounters:  04/22/20 158 lb 6.4 oz (71.8 kg)  01/19/20 161 lb (73 kg)  10/15/19 160 lb 6 oz (72.7 kg)     Physical Exam Vitals reviewed.  Constitutional:      Appearance: He is well-developed.  HENT:     Head: Normocephalic and atraumatic.     Right Ear: Tympanic membrane and external ear normal. No decreased hearing noted.     Left Ear: Tympanic membrane and external ear normal. No decreased hearing noted.     Mouth/Throat:     Pharynx: No oropharyngeal exudate or posterior oropharyngeal erythema.  Eyes:     Pupils: Pupils are equal, round, and reactive to light.  Cardiovascular:     Rate and Rhythm: Normal rate and regular rhythm.     Heart sounds: No murmur heard.   Pulmonary:     Effort: No respiratory distress.     Breath sounds: Normal breath sounds.  Abdominal:     General: Bowel sounds are normal.     Palpations: Abdomen is soft. There is no mass.     Tenderness: There is no abdominal tenderness.  Musculoskeletal:     Cervical back: Normal range of motion and neck supple.     Assessment & Plan:   Darrion was seen today for follow-up.  Diagnoses and all orders for this visit:  DM type 2 with diabetic dyslipidemia ([place]) -     CBC with Differential/Platelet -     CMP14+EGFR -     Lipid panel -     Bayer DCA Hb A1c Waived -     sitaGLIPtin (JANUVIA) 100 MG tablet; Take 1 tablet (100 mg total) by mouth daily.  Acquired  hypothyroidism -     CBC with Differential/Platelet -     CMP14+EGFR -     Lipid panel -     Thyroid Panel With TSH  Essential hypertension -     CBC with Differential/Platelet -     CMP14+EGFR -     Lipid panel  Hypertension associated with diabetes (HCC) -     CBC with Differential/Platelet -     CMP14+EGFR -     Lipid panel  Gastroesophageal reflux disease without esophagitis -     CBC with Differential/Platelet -     CMP14+EGFR -     Lipid panel  Diabetic neuropathy with neurologic complication (HCC) -     CBC with Differential/Platelet -     CMP14+EGFR -     Lipid panel -     sitaGLIPtin (  JANUVIA) 100 MG tablet; Take 1 tablet (100 mg total) by mouth daily.  Ischemic cardiomyopathy -     CBC with Differential/Platelet -     CMP14+EGFR -     Lipid panel  Need for immunization against influenza -     Flu Vaccine QUAD High Dose(Fluad)  Other orders -     tiotropium (SPIRIVA HANDIHALER) 18 MCG inhalation capsule; Place 1 capsule (18 mcg total) into inhaler and inhale daily as needed (shortness of breath).   I have changed Nikki Dom. Man's Januvia to sitaGLIPtin. I am also having him maintain his aspirin, nitroGLYCERIN, blood glucose meter kit and supplies, carvedilol, Accu-Chek Guide Control, losartan, Accu-Chek Guide, furosemide, famotidine, amLODipine, levothyroxine, and Spiriva HandiHaler.  Meds ordered this encounter  Medications  . tiotropium (SPIRIVA HANDIHALER) 18 MCG inhalation capsule    Sig: Place 1 capsule (18 mcg total) into inhaler and inhale daily as needed (shortness of breath).    Dispense:  90 capsule    Refill:  3  . sitaGLIPtin (JANUVIA) 100 MG tablet    Sig: Take 1 tablet (100 mg total) by mouth daily.    Dispense:  90 tablet    Refill:  0     Follow-up: Return in about 3 months (around 07/23/2020).  Claretta Fraise, M.D.

## 2020-04-23 LAB — CBC WITH DIFFERENTIAL/PLATELET
Basophils Absolute: 0.1 10*3/uL (ref 0.0–0.2)
Basos: 1 %
EOS (ABSOLUTE): 0.2 10*3/uL (ref 0.0–0.4)
Eos: 4 %
Hematocrit: 41.9 % (ref 37.5–51.0)
Hemoglobin: 14 g/dL (ref 13.0–17.7)
Immature Grans (Abs): 0 10*3/uL (ref 0.0–0.1)
Immature Granulocytes: 0 %
Lymphocytes Absolute: 1.1 10*3/uL (ref 0.7–3.1)
Lymphs: 19 %
MCH: 31.5 pg (ref 26.6–33.0)
MCHC: 33.4 g/dL (ref 31.5–35.7)
MCV: 94 fL (ref 79–97)
Monocytes Absolute: 0.6 10*3/uL (ref 0.1–0.9)
Monocytes: 10 %
Neutrophils Absolute: 3.7 10*3/uL (ref 1.4–7.0)
Neutrophils: 66 %
Platelets: 199 10*3/uL (ref 150–450)
RBC: 4.44 x10E6/uL (ref 4.14–5.80)
RDW: 12.8 % (ref 11.6–15.4)
WBC: 5.7 10*3/uL (ref 3.4–10.8)

## 2020-04-23 LAB — CMP14+EGFR
ALT: 13 IU/L (ref 0–44)
AST: 22 IU/L (ref 0–40)
Albumin/Globulin Ratio: 1.7 (ref 1.2–2.2)
Albumin: 4.4 g/dL (ref 3.5–4.6)
Alkaline Phosphatase: 78 IU/L (ref 44–121)
BUN/Creatinine Ratio: 16 (ref 10–24)
BUN: 17 mg/dL (ref 10–36)
Bilirubin Total: 0.6 mg/dL (ref 0.0–1.2)
CO2: 25 mmol/L (ref 20–29)
Calcium: 10.1 mg/dL (ref 8.6–10.2)
Chloride: 96 mmol/L (ref 96–106)
Creatinine, Ser: 1.06 mg/dL (ref 0.76–1.27)
GFR calc Af Amer: 69 mL/min/{1.73_m2} (ref >59)
GFR calc non Af Amer: 59 mL/min/{1.73_m2} — ABNORMAL LOW (ref >59)
Globulin, Total: 2.6 g/dL (ref 1.5–4.5)
Glucose: 172 mg/dL — ABNORMAL HIGH (ref 65–99)
Potassium: 4.7 mmol/L (ref 3.5–5.2)
Sodium: 136 mmol/L (ref 134–144)
Total Protein: 7 g/dL (ref 6.0–8.5)

## 2020-04-23 LAB — LIPID PANEL
Chol/HDL Ratio: 5.4 ratio — ABNORMAL HIGH (ref 0.0–5.0)
Cholesterol, Total: 237 mg/dL — ABNORMAL HIGH (ref 100–199)
HDL: 44 mg/dL (ref >39)
LDL Chol Calc (NIH): 161 mg/dL — ABNORMAL HIGH (ref 0–99)
Triglycerides: 175 mg/dL — ABNORMAL HIGH (ref 0–149)
VLDL Cholesterol Cal: 32 mg/dL (ref 5–40)

## 2020-04-23 LAB — THYROID PANEL WITH TSH
Free Thyroxine Index: 2.8 (ref 1.2–4.9)
T3 Uptake Ratio: 32 % (ref 24–39)
T4, Total: 8.9 ug/dL (ref 4.5–12.0)
TSH: 3.72 u[IU]/mL (ref 0.450–4.500)

## 2020-04-25 ENCOUNTER — Encounter: Payer: Self-pay | Admitting: Family Medicine

## 2020-07-07 ENCOUNTER — Other Ambulatory Visit: Payer: Self-pay | Admitting: Family Medicine

## 2020-07-07 DIAGNOSIS — E1140 Type 2 diabetes mellitus with diabetic neuropathy, unspecified: Secondary | ICD-10-CM

## 2020-07-07 DIAGNOSIS — E785 Hyperlipidemia, unspecified: Secondary | ICD-10-CM

## 2020-07-07 DIAGNOSIS — I10 Essential (primary) hypertension: Secondary | ICD-10-CM

## 2020-07-07 DIAGNOSIS — E1169 Type 2 diabetes mellitus with other specified complication: Secondary | ICD-10-CM

## 2020-07-07 DIAGNOSIS — E1149 Type 2 diabetes mellitus with other diabetic neurological complication: Secondary | ICD-10-CM

## 2020-07-16 ENCOUNTER — Ambulatory Visit (HOSPITAL_COMMUNITY)
Admission: RE | Admit: 2020-07-16 | Discharge: 2020-07-16 | Disposition: A | Discharge status: Home / Self Care | Payer: Medicare HMO | Source: Ambulatory Visit | Attending: Cardiovascular Disease | Admitting: Cardiovascular Disease

## 2020-07-16 ENCOUNTER — Other Ambulatory Visit: Payer: Self-pay

## 2020-07-16 ENCOUNTER — Other Ambulatory Visit (HOSPITAL_COMMUNITY): Payer: Self-pay | Admitting: Cardiovascular Disease

## 2020-07-16 DIAGNOSIS — I6523 Occlusion and stenosis of bilateral carotid arteries: Secondary | ICD-10-CM | POA: Diagnosis not present

## 2020-07-23 ENCOUNTER — Encounter: Payer: Self-pay | Admitting: Family Medicine

## 2020-07-23 ENCOUNTER — Ambulatory Visit (INDEPENDENT_AMBULATORY_CARE_PROVIDER_SITE_OTHER): Payer: Medicare HMO | Admitting: Family Medicine

## 2020-07-23 ENCOUNTER — Other Ambulatory Visit: Payer: Self-pay

## 2020-07-23 VITALS — BP 124/78 | HR 83 | Temp 97.4°F | Resp 20 | Ht 70.0 in | Wt 160.2 lb

## 2020-07-23 DIAGNOSIS — E1149 Type 2 diabetes mellitus with other diabetic neurological complication: Secondary | ICD-10-CM | POA: Diagnosis not present

## 2020-07-23 DIAGNOSIS — E1140 Type 2 diabetes mellitus with diabetic neuropathy, unspecified: Secondary | ICD-10-CM | POA: Diagnosis not present

## 2020-07-23 DIAGNOSIS — E039 Hypothyroidism, unspecified: Secondary | ICD-10-CM | POA: Diagnosis not present

## 2020-07-23 DIAGNOSIS — E785 Hyperlipidemia, unspecified: Secondary | ICD-10-CM | POA: Diagnosis not present

## 2020-07-23 DIAGNOSIS — E1169 Type 2 diabetes mellitus with other specified complication: Secondary | ICD-10-CM | POA: Diagnosis not present

## 2020-07-23 DIAGNOSIS — I10 Essential (primary) hypertension: Secondary | ICD-10-CM

## 2020-07-23 LAB — BAYER DCA HB A1C WAIVED: HB A1C (BAYER DCA - WAIVED): 7.4 % — ABNORMAL HIGH (ref <7.0)

## 2020-07-24 LAB — CMP14+EGFR
ALT: 15 IU/L (ref 0–44)
AST: 21 IU/L (ref 0–40)
Albumin/Globulin Ratio: 1.7 (ref 1.2–2.2)
Albumin: 4.3 g/dL (ref 3.5–4.6)
Alkaline Phosphatase: 77 IU/L (ref 44–121)
BUN/Creatinine Ratio: 20 (ref 10–24)
BUN: 25 mg/dL (ref 10–36)
Bilirubin Total: 0.5 mg/dL (ref 0.0–1.2)
CO2: 23 mmol/L (ref 20–29)
Calcium: 10.2 mg/dL (ref 8.6–10.2)
Chloride: 99 mmol/L (ref 96–106)
Creatinine, Ser: 1.23 mg/dL (ref 0.76–1.27)
GFR calc Af Amer: 57 mL/min/{1.73_m2} — ABNORMAL LOW (ref >59)
GFR calc non Af Amer: 50 mL/min/{1.73_m2} — ABNORMAL LOW (ref >59)
Globulin, Total: 2.5 g/dL (ref 1.5–4.5)
Glucose: 167 mg/dL — ABNORMAL HIGH (ref 65–99)
Potassium: 5.1 mmol/L (ref 3.5–5.2)
Sodium: 138 mmol/L (ref 134–144)
Total Protein: 6.8 g/dL (ref 6.0–8.5)

## 2020-07-24 LAB — CBC WITH DIFFERENTIAL/PLATELET
Basophils Absolute: 0.1 10*3/uL (ref 0.0–0.2)
Basos: 1 %
EOS (ABSOLUTE): 0.3 10*3/uL (ref 0.0–0.4)
Eos: 5 %
Hematocrit: 42.3 % (ref 37.5–51.0)
Hemoglobin: 14.4 g/dL (ref 13.0–17.7)
Immature Grans (Abs): 0 10*3/uL (ref 0.0–0.1)
Immature Granulocytes: 0 %
Lymphocytes Absolute: 1.1 10*3/uL (ref 0.7–3.1)
Lymphs: 20 %
MCH: 31.5 pg (ref 26.6–33.0)
MCHC: 34 g/dL (ref 31.5–35.7)
MCV: 93 fL (ref 79–97)
Monocytes Absolute: 0.5 10*3/uL (ref 0.1–0.9)
Monocytes: 10 %
Neutrophils Absolute: 3.4 10*3/uL (ref 1.4–7.0)
Neutrophils: 64 %
Platelets: 195 10*3/uL (ref 150–450)
RBC: 4.57 x10E6/uL (ref 4.14–5.80)
RDW: 12.5 % (ref 11.6–15.4)
WBC: 5.3 10*3/uL (ref 3.4–10.8)

## 2020-07-24 LAB — THYROID PANEL WITH TSH
Free Thyroxine Index: 3 (ref 1.2–4.9)
T3 Uptake Ratio: 33 % (ref 24–39)
T4, Total: 9.1 ug/dL (ref 4.5–12.0)
TSH: 3.71 u[IU]/mL (ref 0.450–4.500)

## 2020-07-24 LAB — LIPID PANEL
Chol/HDL Ratio: 5.2 ratio — ABNORMAL HIGH (ref 0.0–5.0)
Cholesterol, Total: 218 mg/dL — ABNORMAL HIGH (ref 100–199)
HDL: 42 mg/dL (ref >39)
LDL Chol Calc (NIH): 146 mg/dL — ABNORMAL HIGH (ref 0–99)
Triglycerides: 165 mg/dL — ABNORMAL HIGH (ref 0–149)
VLDL Cholesterol Cal: 30 mg/dL (ref 5–40)

## 2020-07-25 ENCOUNTER — Encounter: Payer: Self-pay | Admitting: Family Medicine

## 2020-07-25 MED ORDER — LOSARTAN POTASSIUM 100 MG PO TABS
100.0000 mg | ORAL_TABLET | Freq: Every day | ORAL | 2 refills | Status: DC
Start: 2020-07-25 — End: 2020-09-22

## 2020-07-25 MED ORDER — FUROSEMIDE 40 MG PO TABS: ORAL_TABLET | ORAL | 1 refills | Status: DC

## 2020-07-25 MED ORDER — AMLODIPINE BESYLATE 5 MG PO TABS: ORAL_TABLET | ORAL | 1 refills | Status: DC

## 2020-07-25 MED ORDER — FAMOTIDINE 20 MG PO TABS: 40.0000 mg | ORAL_TABLET | Freq: Every day | ORAL | 1 refills | Status: DC

## 2020-07-25 NOTE — Progress Notes (Signed)
Subjective:  Patient ID: Sean Hartman,  male    DOB: [birth year]  Age: 87 y.o.    CC: Medical Management of Chronic Issues   HPI TYSE AURIEMMA presents for  follow-up of hypertension. Patient has no history of headache chest pain or shortness of breath or recent cough. Patient also denies symptoms of TIA such as numbness weakness lateralizing. Patient denies side effects from medication. States taking it regularly.  Patient also  in for follow-up of elevated cholesterol. Doing well without complaints on current medication. Denies side effects  including myalgia and arthralgia and nausea. Also in today for liver function testing. Currently no chest pain, shortness of breath or other cardiovascular related symptoms noted.  Follow-up of diabetes. Patient does check blood sugar at home.  Patient denies symptoms such as excessive hunger or urinary frequency, excessive hunger, nausea No significant hypoglycemic spells noted. Medications reviewed. Pt reports taking them regularly. Pt. denies complication/adverse reaction today.    History Jaan has a past medical history of Anemia, CAD (coronary artery disease), Carotid stenosis, Colon cancer ([place]), DM2 (diabetes mellitus, type 2) ([place]), Essential hypertension, HLD (hyperlipidemia), Ischemic cardiomyopathy, and Status post partial colectomy (11/19/2015).   He has a past surgical history that includes Cataract extraction w/ intraocular lens  implant, bilateral; Colectomy; Coronary angioplasty with stent; Inguinal hernia repair (Left); 25 gauge pars plana vitrectomy with 20 gauge mvr port (Left, 03/20/2017); and 25 gauge pars plana vitrectomy with 20 gauge mvr port (Left, 03/20/2017).   His family history includes Hypertension in his father.He reports that he quit smoking about 21 years ago. He quit after 15.00 years of use. He has never used smokeless tobacco. He reports that he does not drink alcohol and does not use drugs.  Current  Outpatient Medications on File Prior to Visit  Medication Sig Dispense Refill  . ACCU-CHEK GUIDE test strip TEST BLOOD SUGAR TWICE DAILY AS DIRECTED 100 strip 11  . aspirin 81 MG EC tablet Take 81 mg by mouth daily.    . carvedilol (COREG) 6.25 MG tablet TAKE 1 TABLET TWICE DAILY WITH MEALS 180 tablet 1  . JANUVIA 100 MG tablet TAKE 1 TABLET EVERY DAY 90 tablet 0  . levothyroxine (SYNTHROID) 100 MCG tablet TAKE 1 TABLET (100 MCG TOTAL) BY MOUTH DAILY. 90 tablet 3  . nitroGLYCERIN (NITROSTAT) 0.4 MG SL tablet DISSOLVE 1 TABLET UNDER TONGUE EVERY 5 MINUTES AS NEEDED FOR CHEST PAIN UP TO 3 DOSES 25 tablet 3  . tiotropium (SPIRIVA HANDIHALER) 18 MCG inhalation capsule Place 1 capsule (18 mcg total) into inhaler and inhale daily as needed (shortness of breath). 90 capsule 3   No current facility-administered medications on file prior to visit.    ROS Review of Systems  Constitutional: Negative for fever.  Respiratory: Negative for shortness of breath.   Cardiovascular: Negative for chest pain.  Musculoskeletal: Negative for arthralgias.  Skin: Negative for rash.    Objective:  BP 124/78   Pulse 83   Temp (!) 97.4 F (36.3 C) (Temporal)   Resp 20   Ht $R'5\' 10"'mf$  (1.778 m)   Wt 160 lb 4 oz (72.7 kg)   SpO2 97%   BMI 22.99 kg/m   BP Readings from Last 3 Encounters:  07/23/20 124/78  04/22/20 137/67  01/19/20 132/64    Wt Readings from Last 3 Encounters:  07/23/20 160 lb 4 oz (72.7 kg)  04/22/20 158 lb 6.4 oz (71.8 kg)  01/19/20 161 lb (73 kg)  Physical Exam Vitals reviewed.  Constitutional:      Appearance: He is well-developed and well-nourished.  HENT:     Head: Normocephalic and atraumatic.     Right Ear: Tympanic membrane and external ear normal. No decreased hearing noted.     Left Ear: Tympanic membrane and external ear normal. No decreased hearing noted.     Mouth/Throat:     Pharynx: No oropharyngeal exudate or posterior oropharyngeal erythema.  Eyes:      Pupils: Pupils are equal, round, and reactive to light.  Cardiovascular:     Rate and Rhythm: Normal rate and regular rhythm.     Heart sounds: No murmur heard.   Pulmonary:     Effort: No respiratory distress.     Breath sounds: Normal breath sounds.  Abdominal:     General: Bowel sounds are normal.     Palpations: Abdomen is soft. There is no mass.     Tenderness: There is no abdominal tenderness.  Musculoskeletal:     Cervical back: Normal range of motion and neck supple.     Diabetic Foot Exam - Simple   No data filed     Results for orders placed or performed in visit on 07/23/20  Bayer DCA Hb A1c Waived  Result Value Ref Range   HB A1C (BAYER DCA - WAIVED) 7.4 (H) <7.0 %  CBC with Differential/Platelet  Result Value Ref Range   WBC 5.3 3.4 - 10.8 x10E3/uL   RBC 4.57 4.14 - 5.80 x10E6/uL   Hemoglobin 14.4 13.0 - 17.7 g/dL   Hematocrit 42.3 37.5 - 51.0 %   MCV 93 79 - 97 fL   MCH 31.5 26.6 - 33.0 pg   MCHC 34.0 31.5 - 35.7 g/dL   RDW 12.5 11.6 - 15.4 %   Platelets 195 150 - 450 x10E3/uL   Neutrophils 64 Not Estab. %   Lymphs 20 Not Estab. %   Monocytes 10 Not Estab. %   Eos 5 Not Estab. %   Basos 1 Not Estab. %   Neutrophils Absolute 3.4 1.4 - 7.0 x10E3/uL   Lymphocytes Absolute 1.1 0.7 - 3.1 x10E3/uL   Monocytes Absolute 0.5 0.1 - 0.9 x10E3/uL   EOS (ABSOLUTE) 0.3 0.0 - 0.4 x10E3/uL   Basophils Absolute 0.1 0.0 - 0.2 x10E3/uL   Immature Granulocytes 0 Not Estab. %   Immature Grans (Abs) 0.0 0.0 - 0.1 x10E3/uL  CMP14+EGFR  Result Value Ref Range   Glucose 167 (H) 65 - 99 mg/dL   BUN 25 10 - 36 mg/dL   Creatinine, Ser 1.23 0.76 - 1.27 mg/dL   GFR calc non Af Amer 50 (L) >59 mL/min/1.73   GFR calc Af Amer 57 (L) >59 mL/min/1.73   BUN/Creatinine Ratio 20 10 - 24   Sodium 138 134 - 144 mmol/L   Potassium 5.1 3.5 - 5.2 mmol/L   Chloride 99 96 - 106 mmol/L   CO2 23 20 - 29 mmol/L   Calcium 10.2 8.6 - 10.2 mg/dL   Total Protein 6.8 6.0 - 8.5 g/dL   Albumin  4.3 3.5 - 4.6 g/dL   Globulin, Total 2.5 1.5 - 4.5 g/dL   Albumin/Globulin Ratio 1.7 1.2 - 2.2   Bilirubin Total 0.5 0.0 - 1.2 mg/dL   Alkaline Phosphatase 77 44 - 121 IU/L   AST 21 0 - 40 IU/L   ALT 15 0 - 44 IU/L  Lipid panel  Result Value Ref Range   Cholesterol, Total 218 (H) 100 -  199 mg/dL   Triglycerides 165 (H) 0 - 149 mg/dL   HDL 42 >39 mg/dL   VLDL Cholesterol Cal 30 5 - 40 mg/dL   LDL Chol Calc (NIH) 146 (H) 0 - 99 mg/dL   Chol/HDL Ratio 5.2 (H) 0.0 - 5.0 ratio  Thyroid Panel With TSH  Result Value Ref Range   TSH 3.710 0.450 - 4.500 uIU/mL   T4, Total 9.1 4.5 - 12.0 ug/dL   T3 Uptake Ratio 33 24 - 39 %   Free Thyroxine Index 3.0 1.2 - 4.9     Assessment & Plan:   Tasha was seen today for medical management of chronic issues.  Diagnoses and all orders for this visit:  DM type 2 with diabetic dyslipidemia ([place]) -     Bayer DCA Hb A1c Waived -     CBC with Differential/Platelet -     CMP14+EGFR -     Lipid panel  Acquired hypothyroidism -     CBC with Differential/Platelet -     CMP14+EGFR -     Lipid panel -     Thyroid Panel With TSH  Essential hypertension -     CBC with Differential/Platelet -     CMP14+EGFR -     Lipid panel -     losartan (COZAAR) 100 MG tablet; Take 1 tablet (100 mg total) by mouth daily.  Diabetic neuropathy with neurologic complication (HCC) -     losartan (COZAAR) 100 MG tablet; Take 1 tablet (100 mg total) by mouth daily.  Other orders -     furosemide (LASIX) 40 MG tablet; TAKE 1 TABLET EVERY DAY AS NEEDED  (INCREASED  WEIGHT  GAIN OR SWELLING) -     famotidine (PEPCID) 20 MG tablet; Take 2 tablets (40 mg total) by mouth daily. -     amLODipine (NORVASC) 5 MG tablet; TAKE 1 TABLET EVERY DAY   I have discontinued Nikki Dom. Monteverde's blood glucose meter kit and supplies and Accu-Chek Guide Control. I have also changed his famotidine. Additionally, I am having him maintain his aspirin, nitroGLYCERIN, Accu-Chek Guide,  levothyroxine, Spiriva HandiHaler, carvedilol, Januvia, losartan, furosemide, and amLODipine.  Meds ordered this encounter  Medications  . losartan (COZAAR) 100 MG tablet    Sig: Take 1 tablet (100 mg total) by mouth daily.    Dispense:  90 tablet    Refill:  2  . furosemide (LASIX) 40 MG tablet    Sig: TAKE 1 TABLET EVERY DAY AS NEEDED  (INCREASED  WEIGHT  GAIN OR SWELLING)    Dispense:  90 tablet    Refill:  1  . famotidine (PEPCID) 20 MG tablet    Sig: Take 2 tablets (40 mg total) by mouth daily.    Dispense:  180 tablet    Refill:  1  . amLODipine (NORVASC) 5 MG tablet    Sig: TAKE 1 TABLET EVERY DAY    Dispense:  90 tablet    Refill:  1     Follow-up: Return in about 3 months (around 10/20/2020).  Claretta Fraise, M.D.

## 2020-09-18 ENCOUNTER — Other Ambulatory Visit: Payer: Self-pay | Admitting: Family Medicine

## 2020-09-20 ENCOUNTER — Other Ambulatory Visit: Payer: Self-pay | Admitting: Family Medicine

## 2020-09-20 ENCOUNTER — Other Ambulatory Visit: Payer: Self-pay | Admitting: Cardiovascular Disease

## 2020-09-20 DIAGNOSIS — I10 Essential (primary) hypertension: Secondary | ICD-10-CM

## 2020-09-20 DIAGNOSIS — E1140 Type 2 diabetes mellitus with diabetic neuropathy, unspecified: Secondary | ICD-10-CM

## 2020-09-20 DIAGNOSIS — E1169 Type 2 diabetes mellitus with other specified complication: Secondary | ICD-10-CM

## 2020-10-13 ENCOUNTER — Other Ambulatory Visit: Payer: Self-pay | Admitting: Cardiovascular Disease

## 2020-10-13 DIAGNOSIS — E1140 Type 2 diabetes mellitus with diabetic neuropathy, unspecified: Secondary | ICD-10-CM

## 2020-10-13 DIAGNOSIS — I10 Essential (primary) hypertension: Secondary | ICD-10-CM

## 2020-10-20 ENCOUNTER — Encounter: Payer: Self-pay | Admitting: Family Medicine

## 2020-10-20 ENCOUNTER — Other Ambulatory Visit: Payer: Self-pay

## 2020-10-20 ENCOUNTER — Ambulatory Visit (INDEPENDENT_AMBULATORY_CARE_PROVIDER_SITE_OTHER): Payer: Medicare HMO | Admitting: Family Medicine

## 2020-10-20 VITALS — BP 123/68 | HR 69 | Temp 97.8°F | Ht 70.0 in | Wt 155.4 lb

## 2020-10-20 DIAGNOSIS — E039 Hypothyroidism, unspecified: Secondary | ICD-10-CM

## 2020-10-20 DIAGNOSIS — E785 Hyperlipidemia, unspecified: Secondary | ICD-10-CM

## 2020-10-20 DIAGNOSIS — M2042 Other hammer toe(s) (acquired), left foot: Secondary | ICD-10-CM | POA: Diagnosis not present

## 2020-10-20 DIAGNOSIS — E1169 Type 2 diabetes mellitus with other specified complication: Secondary | ICD-10-CM | POA: Diagnosis not present

## 2020-10-20 LAB — BAYER DCA HB A1C WAIVED: HB A1C (BAYER DCA - WAIVED): 7.4 % — ABNORMAL HIGH (ref <7.0)

## 2020-10-20 NOTE — Progress Notes (Signed)
Subjective:  Patient ID: Sean Hartman, male    DOB: 06-24-1922  Age: 87 y.o. MRN: [id number]  CC: Medical Management of Chronic Issues   HPI Sean Hartman presents forFollow-up of diabetes. Patient not checking blood sugar at home.  Patient denies symptoms such as polyuria, polydipsia, excessive hunger, nausea No significant hypoglycemic spells noted. Medications reviewed. Pt reports taking them regularly without complication/adverse reaction being reported today.  Concerned about the pain in the left second and third toes.  They are turning down when he walks, and that leads to pain at the base of the nail.  History Sean Hartman has a past medical history of Anemia, CAD (coronary artery disease), Carotid stenosis, Colon cancer ([place]), DM2 (diabetes mellitus, type 2) ([place]), Essential hypertension, HLD (hyperlipidemia), Ischemic cardiomyopathy, and Status post partial colectomy (11/19/2015).   He has a past surgical history that includes Cataract extraction w/ intraocular lens  implant, bilateral; Colectomy; Coronary angioplasty with stent; Inguinal hernia repair (Left); 25 gauge pars plana vitrectomy with 20 gauge mvr port (Left, 03/20/2017); and 25 gauge pars plana vitrectomy with 20 gauge mvr port (Left, 03/20/2017).   His family history includes Hypertension in his father.He reports that he quit smoking about 22 years ago. He quit after 15.00 years of use. He has never used smokeless tobacco. He reports that he does not drink alcohol and does not use drugs.  Current Outpatient Medications on File Prior to Visit  Medication Sig Dispense Refill  . ACCU-CHEK GUIDE test strip TEST BLOOD SUGAR TWICE DAILY AS DIRECTED 100 strip 11  . amLODipine (NORVASC) 5 MG tablet TAKE 1 TABLET EVERY DAY 90 tablet 1  . aspirin 81 MG EC tablet Take 81 mg by mouth daily.    . carvedilol (COREG) 6.25 MG tablet TAKE 1 TABLET TWICE DAILY WITH MEALS 180 tablet 1  . famotidine (PEPCID) 20 MG tablet Take 2  tablets (40 mg total) by mouth daily. 180 tablet 1  . furosemide (LASIX) 40 MG tablet TAKE 1 TABLET EVERY DAY AS NEEDED  (INCREASED  WEIGHT  GAIN OR SWELLING) 90 tablet 0  . levothyroxine (SYNTHROID) 100 MCG tablet TAKE 1 TABLET (100 MCG TOTAL) BY MOUTH DAILY. 90 tablet 3  . losartan (COZAAR) 100 MG tablet Take 1 tablet (100 mg total) by mouth daily. Please schedule appt for future refills. 1st attempt. 90 tablet 0  . nitroGLYCERIN (NITROSTAT) 0.4 MG SL tablet DISSOLVE 1 TABLET UNDER TONGUE EVERY 5 MINUTES AS NEEDED FOR CHEST PAIN UP TO 3 DOSES 25 tablet 3  . sitaGLIPtin (JANUVIA) 100 MG tablet Take 1 tablet (100 mg total) by mouth daily. 90 tablet 0  . tiotropium (SPIRIVA HANDIHALER) 18 MCG inhalation capsule Place 1 capsule (18 mcg total) into inhaler and inhale daily as needed (shortness of breath). 90 capsule 3   No current facility-administered medications on file prior to visit.    ROS Review of Systems  Constitutional: Negative.   HENT: Negative.   Eyes: Negative for visual disturbance.  Respiratory: Negative for cough and shortness of breath.   Cardiovascular: Negative for chest pain and leg swelling.  Gastrointestinal: Negative for abdominal pain, diarrhea, nausea and vomiting.  Genitourinary: Negative for difficulty urinating.  Musculoskeletal: Positive for arthralgias (Left second and third DIP left foot). Negative for myalgias.  Skin: Negative for rash.  Neurological: Negative for headaches.  Psychiatric/Behavioral: Negative for sleep disturbance.    Objective:  BP 123/68   Pulse 69   Temp 97.8 F (36.6 C)   Ht  $'5\' 10"'K$  (1.778 m)   Wt 155 lb 6.4 oz (70.5 kg)   SpO2 96%   BMI 22.30 kg/m   BP Readings from Last 3 Encounters:  10/20/20 123/68  07/23/20 124/78  04/22/20 137/67    Wt Readings from Last 3 Encounters:  10/20/20 155 lb 6.4 oz (70.5 kg)  07/23/20 160 lb 4 oz (72.7 kg)  04/22/20 158 lb 6.4 oz (71.8 kg)     Physical Exam Vitals reviewed.   Constitutional:      Appearance: He is well-developed.  HENT:     Head: Normocephalic and atraumatic.     Right Ear: Tympanic membrane and external ear normal. No decreased hearing noted.     Left Ear: Tympanic membrane and external ear normal. No decreased hearing noted.     Mouth/Throat:     Pharynx: No oropharyngeal exudate or posterior oropharyngeal erythema.  Eyes:     Pupils: Pupils are equal, round, and reactive to light.  Cardiovascular:     Rate and Rhythm: Normal rate and regular rhythm.     Heart sounds: No murmur heard.   Pulmonary:     Effort: No respiratory distress.     Breath sounds: Normal breath sounds.  Abdominal:     General: Bowel sounds are normal.     Palpations: Abdomen is soft. There is no mass.     Tenderness: There is no abdominal tenderness.  Musculoskeletal:        General: Deformity (Early hammertoe deformity at left second and third toes.  The nails are long and irregular throughout the toes) present.     Cervical back: Normal range of motion and neck supple.       Assessment & Plan:   Sean Hartman was seen today for medical management of chronic issues.  Diagnoses and all orders for this visit:  DM type 2 with diabetic dyslipidemia ([place]) -     Bayer DCA Hb A1c Waived -     CBC with Differential/Platelet -     CMP14+EGFR -     Lipid panel  Acquired hypothyroidism -     Thyroid Panel With TSH  Hammer toe of left foot -     Ambulatory referral to Podiatry      I am having Nikki Dom. Manley maintain his aspirin, nitroGLYCERIN, Accu-Chek Guide, levothyroxine, Spiriva HandiHaler, carvedilol, famotidine, amLODipine, sitaGLIPtin, furosemide, and losartan.  No orders of the defined types were placed in this encounter.    Follow-up: Return in about 3 months (around 01/20/2021).  Claretta Fraise, M.D.

## 2020-10-21 LAB — CBC WITH DIFFERENTIAL/PLATELET
Basophils Absolute: 0.1 10*3/uL (ref 0.0–0.2)
Basos: 1 %
EOS (ABSOLUTE): 0.2 10*3/uL (ref 0.0–0.4)
Eos: 4 %
Hematocrit: 41.4 % (ref 37.5–51.0)
Hemoglobin: 14.2 g/dL (ref 13.0–17.7)
Immature Grans (Abs): 0 10*3/uL (ref 0.0–0.1)
Immature Granulocytes: 0 %
Lymphocytes Absolute: 1 10*3/uL (ref 0.7–3.1)
Lymphs: 17 %
MCH: 30.8 pg (ref 26.6–33.0)
MCHC: 34.3 g/dL (ref 31.5–35.7)
MCV: 90 fL (ref 79–97)
Monocytes Absolute: 0.7 10*3/uL (ref 0.1–0.9)
Monocytes: 12 %
Neutrophils Absolute: 4.1 10*3/uL (ref 1.4–7.0)
Neutrophils: 66 %
Platelets: 192 10*3/uL (ref 150–450)
RBC: 4.61 x10E6/uL (ref 4.14–5.80)
RDW: 12.7 % (ref 11.6–15.4)
WBC: 6.1 10*3/uL (ref 3.4–10.8)

## 2020-10-21 LAB — CMP14+EGFR
ALT: 13 IU/L (ref 0–44)
AST: 21 IU/L (ref 0–40)
Albumin/Globulin Ratio: 1.8 (ref 1.2–2.2)
Albumin: 4.4 g/dL (ref 3.5–4.6)
Alkaline Phosphatase: 75 IU/L (ref 44–121)
BUN/Creatinine Ratio: 21 (ref 10–24)
BUN: 22 mg/dL (ref 10–36)
Bilirubin Total: 0.5 mg/dL (ref 0.0–1.2)
CO2: 24 mmol/L (ref 20–29)
Calcium: 9.9 mg/dL (ref 8.6–10.2)
Chloride: 99 mmol/L (ref 96–106)
Creatinine, Ser: 1.07 mg/dL (ref 0.76–1.27)
Globulin, Total: 2.5 g/dL (ref 1.5–4.5)
Glucose: 139 mg/dL — ABNORMAL HIGH (ref 65–99)
Potassium: 4.7 mmol/L (ref 3.5–5.2)
Sodium: 138 mmol/L (ref 134–144)
Total Protein: 6.9 g/dL (ref 6.0–8.5)
eGFR: 64 mL/min/{1.73_m2} (ref >59)

## 2020-10-21 LAB — LIPID PANEL
Chol/HDL Ratio: 4.6 ratio (ref 0.0–5.0)
Cholesterol, Total: 196 mg/dL (ref 100–199)
HDL: 43 mg/dL (ref >39)
LDL Chol Calc (NIH): 125 mg/dL — ABNORMAL HIGH (ref 0–99)
Triglycerides: 157 mg/dL — ABNORMAL HIGH (ref 0–149)
VLDL Cholesterol Cal: 28 mg/dL (ref 5–40)

## 2020-10-21 LAB — THYROID PANEL WITH TSH
Free Thyroxine Index: 2.5 (ref 1.2–4.9)
T3 Uptake Ratio: 31 % (ref 24–39)
T4, Total: 8 ug/dL (ref 4.5–12.0)
TSH: 4.14 u[IU]/mL (ref 0.450–4.500)

## 2020-10-24 NOTE — Progress Notes (Signed)
Hello [place],  Your lab result is normal and/or stable.Some minor variations that are not significant are commonly marked abnormal, but do not represent any medical problem for you.  Best regards, Cardell Rachel, M.D.

## 2020-11-24 ENCOUNTER — Telehealth: Payer: Self-pay | Admitting: *Deleted

## 2020-11-24 DIAGNOSIS — I10 Essential (primary) hypertension: Secondary | ICD-10-CM

## 2020-11-24 DIAGNOSIS — E1149 Type 2 diabetes mellitus with other diabetic neurological complication: Secondary | ICD-10-CM

## 2020-11-24 MED ORDER — CARVEDILOL 6.25 MG PO TABS: 6.2500 mg | ORAL_TABLET | Freq: Two times a day (BID) | ORAL | 0 refills | Status: DC

## 2020-11-24 NOTE — Telephone Encounter (Signed)
VM from [facility], pt's Carvedilol refill is stuck in shipping, pt needs a supply sent to local [facility]. Refill sent

## 2020-12-09 ENCOUNTER — Other Ambulatory Visit: Payer: Self-pay | Admitting: Family Medicine

## 2020-12-09 DIAGNOSIS — E1149 Type 2 diabetes mellitus with other diabetic neurological complication: Secondary | ICD-10-CM

## 2020-12-09 DIAGNOSIS — E1140 Type 2 diabetes mellitus with diabetic neuropathy, unspecified: Secondary | ICD-10-CM

## 2020-12-09 DIAGNOSIS — I10 Essential (primary) hypertension: Secondary | ICD-10-CM

## 2020-12-14 ENCOUNTER — Telehealth: Payer: Self-pay | Admitting: *Deleted

## 2020-12-14 DIAGNOSIS — E039 Hypothyroidism, unspecified: Secondary | ICD-10-CM

## 2020-12-14 MED ORDER — NITROGLYCERIN 0.4 MG SL SUBL: SUBLINGUAL_TABLET | SUBLINGUAL | 3 refills | Status: DC

## 2020-12-14 MED ORDER — FUROSEMIDE 40 MG PO TABS: ORAL_TABLET | ORAL | 0 refills | Status: DC

## 2020-12-14 MED ORDER — ACCU-CHEK GUIDE VI STRP: ORAL_STRIP | 3 refills | Status: DC

## 2020-12-14 MED ORDER — FAMOTIDINE 20 MG PO TABS: 40.0000 mg | ORAL_TABLET | Freq: Every day | ORAL | 0 refills | Status: DC

## 2020-12-14 MED ORDER — AMLODIPINE BESYLATE 5 MG PO TABS: ORAL_TABLET | ORAL | 0 refills | Status: DC

## 2020-12-14 MED ORDER — LEVOTHYROXINE SODIUM 100 MCG PO TABS: 100.0000 ug | ORAL_TABLET | Freq: Every day | ORAL | 2 refills | Status: DC

## 2020-12-14 NOTE — Telephone Encounter (Signed)
Needing to get medicines to sent to [facility] pharmacy

## 2021-01-24 ENCOUNTER — Encounter: Payer: Self-pay | Admitting: Family Medicine

## 2021-01-24 ENCOUNTER — Other Ambulatory Visit: Payer: Self-pay

## 2021-01-24 ENCOUNTER — Ambulatory Visit (INDEPENDENT_AMBULATORY_CARE_PROVIDER_SITE_OTHER): Payer: Medicare HMO | Admitting: Family Medicine

## 2021-01-24 VITALS — BP 118/65 | HR 70 | Temp 97.1°F | Ht 70.0 in | Wt 155.8 lb

## 2021-01-24 DIAGNOSIS — E785 Hyperlipidemia, unspecified: Secondary | ICD-10-CM

## 2021-01-24 DIAGNOSIS — E039 Hypothyroidism, unspecified: Secondary | ICD-10-CM | POA: Diagnosis not present

## 2021-01-24 DIAGNOSIS — E1159 Type 2 diabetes mellitus with other circulatory complications: Secondary | ICD-10-CM | POA: Diagnosis not present

## 2021-01-24 DIAGNOSIS — E1169 Type 2 diabetes mellitus with other specified complication: Secondary | ICD-10-CM

## 2021-01-24 DIAGNOSIS — I10 Essential (primary) hypertension: Secondary | ICD-10-CM | POA: Diagnosis not present

## 2021-01-24 DIAGNOSIS — E1140 Type 2 diabetes mellitus with diabetic neuropathy, unspecified: Secondary | ICD-10-CM | POA: Diagnosis not present

## 2021-01-24 DIAGNOSIS — E1149 Type 2 diabetes mellitus with other diabetic neurological complication: Secondary | ICD-10-CM | POA: Diagnosis not present

## 2021-01-24 DIAGNOSIS — I152 Hypertension secondary to endocrine disorders: Secondary | ICD-10-CM | POA: Diagnosis not present

## 2021-01-24 LAB — BAYER DCA HB A1C WAIVED: HB A1C (BAYER DCA - WAIVED): 7 % — ABNORMAL HIGH (ref <7.0)

## 2021-01-24 MED ORDER — LOSARTAN POTASSIUM 50 MG PO TABS: 50.0000 mg | ORAL_TABLET | Freq: Every day | ORAL | 1 refills | Status: DC

## 2021-01-24 MED ORDER — SITAGLIPTIN PHOSPHATE 100 MG PO TABS: 100.0000 mg | ORAL_TABLET | Freq: Every day | ORAL | 3 refills | Status: DC

## 2021-01-24 MED ORDER — AMLODIPINE BESYLATE 2.5 MG PO TABS: 2.5000 mg | ORAL_TABLET | Freq: Every day | ORAL | 1 refills | Status: DC

## 2021-01-24 NOTE — Progress Notes (Signed)
Subjective:  Patient ID: Sean Hartman, male    DOB: [birth year]  Age: 87 y.o. MRN: [id number]  CC: Medical Management of Chronic Issues   HPI Sean Hartman presents forFollow-up of diabetes. Patient checks blood sugar at home.   130s fasting and not checking postprandial Patient denies symptoms such as polyuria, polydipsia, excessive hunger, nausea No significant hypoglycemic spells noted. Medications reviewed. Pt reports taking them regularly without complication/adverse reaction being reported today.   Patient's son is here today with him.  He gives a history that his dad is unsteady for the first few moments when he stands up.  He is reliant on a walker.  He has not fallen as a result.  Sean Hartman tells me that it does not happen very often.  He denies room moving around, spinning or himself moving around in the room as a sensation when this occurs.  He just feels lightheaded and off balance.  History Sean Hartman has a past medical history of Anemia, CAD (coronary artery disease), Carotid stenosis, Colon cancer ([place]), DM2 (diabetes mellitus, type 2) ([place]), Essential hypertension, HLD (hyperlipidemia), Ischemic cardiomyopathy, and Status post partial colectomy (11/19/2015).   He has a past surgical history that includes Cataract extraction w/ intraocular lens  implant, bilateral; Colectomy; Coronary angioplasty with stent; Inguinal hernia repair (Left); 25 gauge pars plana vitrectomy with 20 gauge mvr port (Left, 03/20/2017); and 25 gauge pars plana vitrectomy with 20 gauge mvr port (Left, 03/20/2017).   His family history includes Hypertension in his father.He reports that he quit smoking about 22 years ago. He has never used smokeless tobacco. He reports that he does not drink alcohol and does not use drugs.  Current Outpatient Medications on File Prior to Visit  Medication Sig Dispense Refill   aspirin 81 MG EC tablet Take 81 mg by mouth daily.     carvedilol (COREG) 6.25 MG  tablet TAKE 1 TABLET TWICE DAILY WITH MEALS 180 tablet 1   famotidine (PEPCID) 20 MG tablet Take 2 tablets (40 mg total) by mouth daily. 180 tablet 0   furosemide (LASIX) 40 MG tablet TAKE 1 TABLET EVERY DAY AS NEEDED  (INCREASED  WEIGHT  GAIN OR SWELLING) 90 tablet 0   glucose blood (ACCU-CHEK GUIDE) test strip TEST BLOOD SUGAR TWICE DAILY AS DIRECTED Dx E11.9 200 strip 3   levothyroxine (SYNTHROID) 100 MCG tablet Take 1 tablet (100 mcg total) by mouth daily. 90 tablet 2   nitroGLYCERIN (NITROSTAT) 0.4 MG SL tablet DISSOLVE 1 TABLET UNDER TONGUE EVERY 5 MINUTES AS NEEDED FOR CHEST PAIN UP TO 3 DOSES 25 tablet 3   tiotropium (SPIRIVA HANDIHALER) 18 MCG inhalation capsule Place 1 capsule (18 mcg total) into inhaler and inhale daily as needed (shortness of breath). 90 capsule 3   No current facility-administered medications on file prior to visit.    ROS Review of Systems  Constitutional:  Negative for fever.  Respiratory:  Negative for shortness of breath.   Cardiovascular:  Negative for chest pain.  Musculoskeletal:  Negative for arthralgias.  Skin:  Negative for rash.  Neurological:  Positive for light-headedness.   Objective:  BP 118/65   Pulse 70   Temp (!) 97.1 F (36.2 C)   Ht _0  (1.778 m)   Wt 155 lb 12.8 oz (70.7 kg)   SpO2 96%   BMI 22.35 kg/m   BP Readings from Last 3 Encounters:  01/24/21 118/65  10/20/20 123/68  07/23/20 124/78    Wt Readings from Last  3 Encounters:  01/24/21 155 lb 12.8 oz (70.7 kg)  10/20/20 155 lb 6.4 oz (70.5 kg)  07/23/20 160 lb 4 oz (72.7 kg)     Physical Exam Vitals reviewed.  Constitutional:      Appearance: He is well-developed.  HENT:     Head: Normocephalic and atraumatic.     Right Ear: External ear normal.     Left Ear: External ear normal.     Mouth/Throat:     Pharynx: No oropharyngeal exudate or posterior oropharyngeal erythema.  Eyes:     Pupils: Pupils are equal, round, and reactive to light.  Cardiovascular:      Rate and Rhythm: Normal rate and regular rhythm.     Heart sounds: No murmur heard. Pulmonary:     Effort: No respiratory distress.     Breath sounds: Normal breath sounds.  Musculoskeletal:     Cervical back: Normal range of motion and neck supple.  Neurological:     Mental Status: He is alert and oriented to person, place, and time.     Gait: Gait abnormal.      Assessment & Plan:   Sean Hartman was seen today for medical management of chronic issues.  Diagnoses and all orders for this visit:  Hypothyroidism, unspecified type -     TSH + free T4  Essential hypertension -     losartan (COZAAR) 50 MG tablet; Take 1 tablet (50 mg total) by mouth daily. Please schedule appt for future refills. 1st attempt.  DM type 2 with diabetic dyslipidemia (HCC) -     Bayer DCA Hb A1c Waived -     CBC with Differential/Platelet -     Lipid panel -     sitaGLIPtin (JANUVIA) 100 MG tablet; Take 1 tablet (100 mg total) by mouth daily.  Hypertension associated with diabetes ([place]) -     CMP14+EGFR  Diabetic neuropathy with neurologic complication (HCC) -     losartan (COZAAR) 50 MG tablet; Take 1 tablet (50 mg total) by mouth daily. Please schedule appt for future refills. 1st attempt. -     sitaGLIPtin (JANUVIA) 100 MG tablet; Take 1 tablet (100 mg total) by mouth daily.  Other orders -     amLODipine (NORVASC) 2.5 MG tablet; Take 1 tablet (2.5 mg total) by mouth daily. TAKE 1 TABLET EVERY DAY     I have changed Sean Hartman's amLODipine and losartan. I am also having him maintain his aspirin, Spiriva HandiHaler, carvedilol, Accu-Chek Guide, famotidine, furosemide, levothyroxine, nitroGLYCERIN, and sitaGLIPtin.  Meds ordered this encounter  Medications   amLODipine (NORVASC) 2.5 MG tablet    Sig: Take 1 tablet (2.5 mg total) by mouth daily. TAKE 1 TABLET EVERY DAY    Dispense:  90 tablet    Refill:  1   losartan (COZAAR) 50 MG tablet    Sig: Take 1 tablet (50 mg total) by mouth  daily. Please schedule appt for future refills. 1st attempt.    Dispense:  90 tablet    Refill:  1   sitaGLIPtin (JANUVIA) 100 MG tablet    Sig: Take 1 tablet (100 mg total) by mouth daily.    Dispense:  90 tablet    Refill:  3     Follow-up: Return in about 3 months (around 04/26/2021).  Claretta Fraise, M.D.

## 2021-01-25 LAB — CMP14+EGFR
ALT: 12 IU/L (ref 0–44)
AST: 18 IU/L (ref 0–40)
Albumin/Globulin Ratio: 1.8 (ref 1.2–2.2)
Albumin: 4.4 g/dL (ref 3.5–4.6)
Alkaline Phosphatase: 78 IU/L (ref 44–121)
BUN/Creatinine Ratio: 24 (ref 10–24)
BUN: 24 mg/dL (ref 10–36)
Bilirubin Total: 0.5 mg/dL (ref 0.0–1.2)
CO2: 24 mmol/L (ref 20–29)
Calcium: 10 mg/dL (ref 8.6–10.2)
Chloride: 100 mmol/L (ref 96–106)
Creatinine, Ser: 0.98 mg/dL (ref 0.76–1.27)
Globulin, Total: 2.5 g/dL (ref 1.5–4.5)
Glucose: 154 mg/dL — ABNORMAL HIGH (ref 65–99)
Potassium: 4.7 mmol/L (ref 3.5–5.2)
Sodium: 136 mmol/L (ref 134–144)
Total Protein: 6.9 g/dL (ref 6.0–8.5)
eGFR: 71 mL/min/{1.73_m2} (ref >59)

## 2021-01-25 LAB — CBC WITH DIFFERENTIAL/PLATELET
Basophils Absolute: 0.1 10*3/uL (ref 0.0–0.2)
Basos: 1 %
EOS (ABSOLUTE): 0.2 10*3/uL (ref 0.0–0.4)
Eos: 3 %
Hematocrit: 42.2 % (ref 37.5–51.0)
Hemoglobin: 15 g/dL (ref 13.0–17.7)
Immature Grans (Abs): 0 10*3/uL (ref 0.0–0.1)
Immature Granulocytes: 0 %
Lymphocytes Absolute: 0.9 10*3/uL (ref 0.7–3.1)
Lymphs: 15 %
MCH: 31.6 pg (ref 26.6–33.0)
MCHC: 35.5 g/dL (ref 31.5–35.7)
MCV: 89 fL (ref 79–97)
Monocytes Absolute: 0.6 10*3/uL (ref 0.1–0.9)
Monocytes: 10 %
Neutrophils Absolute: 4.5 10*3/uL (ref 1.4–7.0)
Neutrophils: 71 %
Platelets: 168 10*3/uL (ref 150–450)
RBC: 4.74 x10E6/uL (ref 4.14–5.80)
RDW: 13 % (ref 11.6–15.4)
WBC: 6.2 10*3/uL (ref 3.4–10.8)

## 2021-01-25 LAB — LIPID PANEL
Chol/HDL Ratio: 4.4 ratio (ref 0.0–5.0)
Cholesterol, Total: 203 mg/dL — ABNORMAL HIGH (ref 100–199)
HDL: 46 mg/dL (ref >39)
LDL Chol Calc (NIH): 129 mg/dL — ABNORMAL HIGH (ref 0–99)
Triglycerides: 156 mg/dL — ABNORMAL HIGH (ref 0–149)
VLDL Cholesterol Cal: 28 mg/dL (ref 5–40)

## 2021-01-25 LAB — TSH+FREE T4
Free T4: 1.51 ng/dL (ref 0.82–1.77)
TSH: 4.22 u[IU]/mL (ref 0.450–4.500)

## 2021-01-25 NOTE — Progress Notes (Signed)
Hello [place],  Your lab result is normal and/or stable.Some minor variations that are not significant are commonly marked abnormal, but do not represent any medical problem for you.  Best regards, Maia Handa, M.D.

## 2021-04-17 ENCOUNTER — Other Ambulatory Visit: Payer: Self-pay | Admitting: Family Medicine

## 2021-04-19 ENCOUNTER — Encounter (HOSPITAL_COMMUNITY): Payer: Self-pay | Admitting: Emergency Medicine

## 2021-04-19 ENCOUNTER — Emergency Department (HOSPITAL_COMMUNITY)
Admission: EM | Admit: 2021-04-19 | Discharge: 2021-04-19 | Disposition: A | Discharge status: Home / Self Care | Payer: Medicare HMO | Attending: Emergency Medicine | Admitting: Emergency Medicine

## 2021-04-19 ENCOUNTER — Other Ambulatory Visit: Payer: Self-pay

## 2021-04-19 DIAGNOSIS — I5032 Chronic diastolic (congestive) heart failure: Secondary | ICD-10-CM | POA: Diagnosis not present

## 2021-04-19 DIAGNOSIS — Z87891 Personal history of nicotine dependence: Secondary | ICD-10-CM | POA: Insufficient documentation

## 2021-04-19 DIAGNOSIS — E039 Hypothyroidism, unspecified: Secondary | ICD-10-CM | POA: Insufficient documentation

## 2021-04-19 DIAGNOSIS — Z7984 Long term (current) use of oral hypoglycemic drugs: Secondary | ICD-10-CM | POA: Diagnosis not present

## 2021-04-19 DIAGNOSIS — N3090 Cystitis, unspecified without hematuria: Secondary | ICD-10-CM | POA: Diagnosis not present

## 2021-04-19 DIAGNOSIS — R319 Hematuria, unspecified: Secondary | ICD-10-CM | POA: Diagnosis not present

## 2021-04-19 DIAGNOSIS — Z79899 Other long term (current) drug therapy: Secondary | ICD-10-CM | POA: Insufficient documentation

## 2021-04-19 DIAGNOSIS — Z85038 Personal history of other malignant neoplasm of large intestine: Secondary | ICD-10-CM | POA: Insufficient documentation

## 2021-04-19 DIAGNOSIS — J449 Chronic obstructive pulmonary disease, unspecified: Secondary | ICD-10-CM | POA: Diagnosis not present

## 2021-04-19 DIAGNOSIS — I25118 Atherosclerotic heart disease of native coronary artery with other forms of angina pectoris: Secondary | ICD-10-CM | POA: Diagnosis not present

## 2021-04-19 DIAGNOSIS — I110 Hypertensive heart disease with heart failure: Secondary | ICD-10-CM | POA: Insufficient documentation

## 2021-04-19 DIAGNOSIS — E1140 Type 2 diabetes mellitus with diabetic neuropathy, unspecified: Secondary | ICD-10-CM | POA: Insufficient documentation

## 2021-04-19 DIAGNOSIS — Z7982 Long term (current) use of aspirin: Secondary | ICD-10-CM | POA: Diagnosis not present

## 2021-04-19 LAB — CBC WITH DIFFERENTIAL/PLATELET
Abs Immature Granulocytes: 0.04 10*3/uL (ref 0.00–0.07)
Basophils Absolute: 0.1 10*3/uL (ref 0.0–0.1)
Basophils Relative: 1 %
Eosinophils Absolute: 0.3 10*3/uL (ref 0.0–0.5)
Eosinophils Relative: 6 %
HCT: 42.5 % (ref 39.0–52.0)
Hemoglobin: 14.9 g/dL (ref 13.0–17.0)
Immature Granulocytes: 1 %
Lymphocytes Relative: 17 %
Lymphs Abs: 1 10*3/uL (ref 0.7–4.0)
MCH: 32.5 pg (ref 26.0–34.0)
MCHC: 35.1 g/dL (ref 30.0–36.0)
MCV: 92.6 fL (ref 80.0–100.0)
Monocytes Absolute: 0.6 10*3/uL (ref 0.1–1.0)
Monocytes Relative: 10 %
Neutro Abs: 4 10*3/uL (ref 1.7–7.7)
Neutrophils Relative %: 65 %
Platelets: 133 10*3/uL — ABNORMAL LOW (ref 150–400)
RBC: 4.59 MIL/uL (ref 4.22–5.81)
RDW: 12.8 % (ref 11.5–15.5)
WBC: 6.1 10*3/uL (ref 4.0–10.5)
nRBC: 0 % (ref 0.0–0.2)

## 2021-04-19 LAB — URINALYSIS, ROUTINE W REFLEX MICROSCOPIC
RBC / HPF: >50 RBC/hpf — ABNORMAL HIGH (ref 0–5)
WBC, UA: >50 WBC/hpf — ABNORMAL HIGH (ref 0–5)

## 2021-04-19 LAB — BASIC METABOLIC PANEL
Anion gap: 8 (ref 5–15)
BUN: 30 mg/dL — ABNORMAL HIGH (ref 8–23)
CO2: 24 mmol/L (ref 22–32)
Calcium: 9.8 mg/dL (ref 8.9–10.3)
Chloride: 101 mmol/L (ref 98–111)
Creatinine, Ser: 0.95 mg/dL (ref 0.61–1.24)
GFR, Estimated: >60 mL/min (ref >60)
Glucose, Bld: 175 mg/dL — ABNORMAL HIGH (ref 70–99)
Potassium: 4.9 mmol/L (ref 3.5–5.1)
Sodium: 133 mmol/L — ABNORMAL LOW (ref 135–145)

## 2021-04-19 MED ORDER — LIDOCAINE HCL (PF) 1 % IJ SOLN
INTRAMUSCULAR | Status: AC
  Administered 2021-04-19: 30 mL
  Filled 2021-04-19: qty 30

## 2021-04-19 MED ORDER — CEFTRIAXONE SODIUM 1 G IJ SOLR
1.0000 g | Freq: Once | INTRAMUSCULAR | Status: AC
  Administered 2021-04-19: 1 g via INTRAMUSCULAR
  Filled 2021-04-19: qty 10

## 2021-04-19 MED ORDER — CEPHALEXIN 500 MG PO CAPS: 500.0000 mg | ORAL_CAPSULE | Freq: Four times a day (QID) | ORAL | 0 refills | Status: DC

## 2021-04-19 NOTE — ED Triage Notes (Signed)
Pt noticed blood in urine that started this evening. Pt states it burn a little when he urinates.

## 2021-04-19 NOTE — ED Provider Notes (Signed)
[facility] EMERGENCY DEPARTMENT Provider Note   CSN: [id number] Arrival date & time: 04/19/21  2101     History Chief Complaint  Patient presents with   Hematuria    Sean Hartman is a 87 y.o. male.  Patient states that he started having blood in his urine today.  He has been urinating more frequently lately  The history is provided by the patient and medical records. No language interpreter was used.  Hematuria This is a new problem. The current episode started 6 to 12 hours ago. The problem occurs constantly. The problem has not changed since onset.Pertinent negatives include no chest pain, no abdominal pain and no headaches. Nothing aggravates the symptoms. Nothing relieves the symptoms. He has tried nothing for the symptoms. The treatment provided no relief.      Past Medical History:  Diagnosis Date   Anemia    fe deficient   CAD (coronary artery disease)    status post anterior wall MI November 2010   Carotid stenosis    with chronic occlusion of LICA and moderate RICA stenosis   Colon cancer (HCC)    DM2 (diabetes mellitus, type 2) ([place])    Essential hypertension    HLD (hyperlipidemia)    Ischemic cardiomyopathy    Status post partial colectomy 11/19/2015    Patient Active Problem List   Diagnosis Date Noted   Hypothyroidism 10/10/2017   Chronic diastolic heart failure ([place]) 10/03/2017   Ischemic cardiomyopathy 10/03/2017   First degree heart block 10/03/2017   Retained lens material following cataract surgery of left eye 03/20/2017   Diabetic neuropathy with neurologic complication ([place]) 41/28/7867   Gastroesophageal reflux disease 07/12/2016   Chronic fatigue, unspecified 11/03/2015   Hilar mass 11/03/2015   Iron deficiency anemia 11/03/2015   CAFL (chronic airflow limitation) ([place]) 12/17/2013   Chronic obstructive pulmonary disease ([place]) 12/17/2013   DM type 2 with diabetic dyslipidemia ([place]) 09/17/2013   Hypertension associated with diabetes  ([place]) 09/17/2013   Carotid artery disease ([place]) 12/27/2009   DIABETES MELLITUS, TYPE II 04/30/2009   HYPERLIPIDEMIA 04/30/2009   Essential hypertension 04/30/2009   Coronary artery disease of native artery of native heart with stable angina pectoris ([place]) 04/30/2009    Past Surgical History:  Procedure Laterality Date   25 GAUGE PARS PLANA VITRECTOMY WITH 20 GAUGE MVR PORT Left 03/20/2017   25 GAUGE PARS PLANA VITRECTOMY WITH 20 GAUGE MVR PORT Left 03/20/2017   Procedure: 25 GAUGE PARS PLANA VITRECTOMY WITH 20 GAUGE MVR PORT;  Surgeon: Hayden Pedro, MD;  Location: [facility];  Service: Ophthalmology;  Laterality: Left;   CATARACT EXTRACTION W/ INTRAOCULAR LENS  IMPLANT, BILATERAL     COLECTOMY     CORONARY ANGIOPLASTY WITH STENT PLACEMENT     percutaneous transluminal coronary angioplasty and bare-metal stent to left anterior descending   INGUINAL HERNIA REPAIR Left        Family History  Problem Relation Age of Onset   Hypertension Father     Social History   Tobacco Use   Smoking status: Former    Years: 15.00    Types: Cigarettes    Quit date: 09/23/1998    Years since quitting: 22.5   Smokeless tobacco: Never  Vaping Use   Vaping Use: Never used  Substance Use Topics   Alcohol use: No   Drug use: No    Home Medications Prior to Admission medications   Medication Sig Start Date End Date Taking? Authorizing Provider  amLODipine (NORVASC) 2.5  MG tablet Take 1 tablet (2.5 mg total) by mouth daily. TAKE 1 TABLET EVERY DAY 01/24/21  Yes Claretta Fraise, MD  aspirin 81 MG EC tablet Take 81 mg by mouth daily.   Yes [provider]  carvedilol (COREG) 6.25 MG tablet TAKE 1 TABLET TWICE DAILY WITH MEALS 12/09/20  Yes Stacks, Cletus Gash, MD  cephALEXin (KEFLEX) 500 MG capsule Take 1 capsule (500 mg total) by mouth 4 (four) times daily. 04/19/21  Yes Milton Ferguson, MD  famotidine (PEPCID) 20 MG tablet TAKE 2 TABLETS (40 MG TOTAL) BY MOUTH DAILY. 04/18/21  Yes Stacks, Cletus Gash,  MD  furosemide (LASIX) 40 MG tablet TAKE 1 TABLET EVERY DAY AS NEEDED  (INCREASED  WEIGHT  GAIN OR SWELLING) 12/14/20  Yes Stacks, Cletus Gash, MD  levothyroxine (SYNTHROID) 100 MCG tablet Take 1 tablet (100 mcg total) by mouth daily. 12/14/20  Yes Stacks, Cletus Gash, MD  losartan (COZAAR) 50 MG tablet Take 1 tablet (50 mg total) by mouth daily. Please schedule appt for future refills. 1st attempt. 01/24/21  Yes Stacks, Cletus Gash, MD  nitroGLYCERIN (NITROSTAT) 0.4 MG SL tablet DISSOLVE 1 TABLET UNDER TONGUE EVERY 5 MINUTES AS NEEDED FOR CHEST PAIN UP TO 3 DOSES 12/14/20  Yes Stacks, Cletus Gash, MD  sitaGLIPtin (JANUVIA) 100 MG tablet Take 1 tablet (100 mg total) by mouth daily. 01/24/21  Yes Claretta Fraise, MD  tiotropium (SPIRIVA HANDIHALER) 18 MCG inhalation capsule Place 1 capsule (18 mcg total) into inhaler and inhale daily as needed (shortness of breath). 04/22/20  Yes Stacks, Cletus Gash, MD  glucose blood (ACCU-CHEK GUIDE) test strip TEST BLOOD SUGAR TWICE DAILY AS DIRECTED Dx E11.9 12/14/20   Claretta Fraise, MD    Allergies    Patient has no known allergies.  Review of Systems   Review of Systems  Constitutional:  Negative for appetite change and fatigue.  HENT:  Negative for congestion, ear discharge and sinus pressure.   Eyes:  Negative for discharge.  Respiratory:  Negative for cough.   Cardiovascular:  Negative for chest pain.  Gastrointestinal:  Negative for abdominal pain and diarrhea.  Genitourinary:  Positive for hematuria. Negative for frequency.  Musculoskeletal:  Negative for back pain.  Skin:  Negative for rash.  Neurological:  Negative for seizures and headaches.  Psychiatric/Behavioral:  Negative for hallucinations.    Physical Exam Updated Vital Signs BP 130/82   Pulse 86   Temp 97.7 F (36.5 C) (Oral)   Resp 17   Ht 5\' 10"  (1.778 m)   Wt 79.9 kg   SpO2 95%   BMI 25.27 kg/m   Physical Exam Vitals and nursing note reviewed.  Constitutional:      Appearance: He is well-developed.   HENT:     Head: Normocephalic.     Nose: Nose normal.  Eyes:     General: No scleral icterus.    Conjunctiva/sclera: Conjunctivae normal.  Neck:     Thyroid: No thyromegaly.  Cardiovascular:     Rate and Rhythm: Normal rate and regular rhythm.     Heart sounds: No murmur heard.   No friction rub. No gallop.  Pulmonary:     Breath sounds: No stridor. No wheezing or rales.  Chest:     Chest wall: No tenderness.  Abdominal:     General: There is no distension.     Tenderness: There is no abdominal tenderness. There is no rebound.  Musculoskeletal:        General: Normal range of motion.     Cervical back: Neck  supple.  Lymphadenopathy:     Cervical: No cervical adenopathy.  Skin:    Findings: No erythema or rash.  Neurological:     Mental Status: He is alert and oriented to person, place, and time.     Motor: No abnormal muscle tone.     Coordination: Coordination normal.  Psychiatric:        Behavior: Behavior normal.    ED Results / Procedures / Treatments   Labs (all labs ordered are listed, but only abnormal results are displayed) Labs Reviewed  URINALYSIS, ROUTINE W REFLEX MICROSCOPIC - Abnormal; Notable for the following components:      Result Value   Color, Urine RED (*)    APPearance HAZY (*)    Glucose, UA   (*)    Value: TEST NOT REPORTED DUE TO COLOR INTERFERENCE OF URINE PIGMENT   Hgb urine dipstick   (*)    Value: TEST NOT REPORTED DUE TO COLOR INTERFERENCE OF URINE PIGMENT   Bilirubin Urine   (*)    Value: TEST NOT REPORTED DUE TO COLOR INTERFERENCE OF URINE PIGMENT   Ketones, ur   (*)    Value: TEST NOT REPORTED DUE TO COLOR INTERFERENCE OF URINE PIGMENT   Protein, ur   (*)    Value: TEST NOT REPORTED DUE TO COLOR INTERFERENCE OF URINE PIGMENT   Nitrite   (*)    Value: TEST NOT REPORTED DUE TO COLOR INTERFERENCE OF URINE PIGMENT   Leukocytes,Ua   (*)    Value: TEST NOT REPORTED DUE TO COLOR INTERFERENCE OF URINE PIGMENT   RBC / HPF >50 (*)     WBC, UA >50 (*)    Bacteria, UA RARE (*)    All other components within normal limits  CBC WITH DIFFERENTIAL/PLATELET - Abnormal; Notable for the following components:   Platelets 133 (*)    All other components within normal limits  BASIC METABOLIC PANEL - Abnormal; Notable for the following components:   Sodium 133 (*)    Glucose, Bld 175 (*)    BUN 30 (*)    All other components within normal limits  URINE CULTURE    EKG None  Radiology No results found.  Procedures Procedures   Medications Ordered in ED Medications  cefTRIAXone (ROCEPHIN) injection 1 g (has no administration in time range)    ED Course  I have reviewed the triage vital signs and the nursing notes.  Pertinent labs & imaging results that were available during my care of the patient were reviewed by me and considered in my medical decision making (see chart for details).    MDM Rules/Calculators/A&P                           Patient with hematuria.  Most likely from urinary tract infection.  Patient will be given Rocephin IM and have his urine cultured and put on Keflex and will follow up with his PCP Final Clinical Impression(s) / ED Diagnoses Final diagnoses:  Cystitis    Rx / DC Orders ED Discharge Orders          Ordered    cephALEXin (KEFLEX) 500 MG capsule  4 times daily        04/19/21 2256             Milton Ferguson, MD 04/19/21 2259

## 2021-04-19 NOTE — Discharge Instructions (Signed)
Drink plenty of fluids.  Follow-up with your doctor next week as planned.  Return sooner if any problems

## 2021-04-21 LAB — URINE CULTURE: Culture: NO GROWTH

## 2021-04-22 ENCOUNTER — Other Ambulatory Visit: Payer: Self-pay | Admitting: Family Medicine

## 2021-04-26 ENCOUNTER — Ambulatory Visit (INDEPENDENT_AMBULATORY_CARE_PROVIDER_SITE_OTHER): Payer: Medicare HMO | Admitting: Family Medicine

## 2021-04-26 ENCOUNTER — Encounter: Payer: Self-pay | Admitting: Family Medicine

## 2021-04-26 ENCOUNTER — Other Ambulatory Visit: Payer: Self-pay

## 2021-04-26 VITALS — BP 133/75 | HR 100 | Temp 98.1°F | Ht 70.0 in | Wt 153.0 lb

## 2021-04-26 DIAGNOSIS — E1159 Type 2 diabetes mellitus with other circulatory complications: Secondary | ICD-10-CM

## 2021-04-26 DIAGNOSIS — E1149 Type 2 diabetes mellitus with other diabetic neurological complication: Secondary | ICD-10-CM

## 2021-04-26 DIAGNOSIS — K5904 Chronic idiopathic constipation: Secondary | ICD-10-CM

## 2021-04-26 DIAGNOSIS — I10 Essential (primary) hypertension: Secondary | ICD-10-CM

## 2021-04-26 DIAGNOSIS — E1169 Type 2 diabetes mellitus with other specified complication: Secondary | ICD-10-CM

## 2021-04-26 DIAGNOSIS — Z23 Encounter for immunization: Secondary | ICD-10-CM

## 2021-04-26 DIAGNOSIS — E039 Hypothyroidism, unspecified: Secondary | ICD-10-CM

## 2021-04-26 DIAGNOSIS — I779 Disorder of arteries and arterioles, unspecified: Secondary | ICD-10-CM

## 2021-04-26 DIAGNOSIS — E1140 Type 2 diabetes mellitus with diabetic neuropathy, unspecified: Secondary | ICD-10-CM

## 2021-04-26 DIAGNOSIS — R310 Gross hematuria: Secondary | ICD-10-CM | POA: Diagnosis not present

## 2021-04-26 DIAGNOSIS — E785 Hyperlipidemia, unspecified: Secondary | ICD-10-CM

## 2021-04-26 DIAGNOSIS — I152 Hypertension secondary to endocrine disorders: Secondary | ICD-10-CM

## 2021-04-26 LAB — URINALYSIS
Bilirubin, UA: NEGATIVE
Glucose, UA: NEGATIVE
Ketones, UA: NEGATIVE
Nitrite, UA: NEGATIVE
RBC, UA: NEGATIVE
Specific Gravity, UA: 1.02 (ref 1.005–1.030)
Urobilinogen, Ur: 0.2 mg/dL (ref 0.2–1.0)
pH, UA: 6 (ref 5.0–7.5)

## 2021-04-26 LAB — BAYER DCA HB A1C WAIVED: HB A1C (BAYER DCA - WAIVED): 7.4 % — ABNORMAL HIGH (ref 4.8–5.6)

## 2021-04-26 MED ORDER — FAMOTIDINE 20 MG PO TABS: 40.0000 mg | ORAL_TABLET | Freq: Every day | ORAL | 2 refills | Status: DC

## 2021-04-26 MED ORDER — FUROSEMIDE 40 MG PO TABS: ORAL_TABLET | ORAL | 3 refills | Status: DC

## 2021-04-26 MED ORDER — LINACLOTIDE 72 MCG PO CAPS: 72.0000 ug | ORAL_CAPSULE | Freq: Every day | ORAL | 3 refills | Status: DC

## 2021-04-26 MED ORDER — CARVEDILOL 6.25 MG PO TABS
6.2500 mg | ORAL_TABLET | Freq: Two times a day (BID) | ORAL | 2 refills | Status: DC
Start: 2021-04-26 — End: 2022-02-14

## 2021-04-26 MED ORDER — LOSARTAN POTASSIUM 50 MG PO TABS: 50.0000 mg | ORAL_TABLET | Freq: Every day | ORAL | 1 refills | Status: DC

## 2021-04-26 MED ORDER — AMLODIPINE BESYLATE 2.5 MG PO TABS: 2.5000 mg | ORAL_TABLET | Freq: Every day | ORAL | 1 refills | Status: DC

## 2021-04-26 NOTE — Progress Notes (Signed)
Subjective:  Patient ID: JANET DECESARE,  male    DOB: [birth year]  Age: 87 y.o.    CC: Medical Management of Chronic Issues   HPI RAYSHUN KANDLER presents for  follow-up of hypertension. Patient has no history of headache chest pain or shortness of breath or recent cough. Patient also denies symptoms of TIA such as numbness weakness lateralizing. Patient denies side effects from medication. States taking it regularly.  Patient also  in for follow-up of elevated cholesterol. Doing well without complaints on current medication. Denies side effects  including myalgia and arthralgia and nausea. Also in today for liver function testing. Currently no chest pain, shortness of breath or other cardiovascular related symptoms noted.  Follow-up of diabetes. Patient does check blood sugar at home. Readings run between 130s fasting and up to 200 prandial Patient denies symptoms such as excessive hunger or urinary frequency, excessive hunger, nausea No significant hypoglycemic spells noted. Medications reviewed. Pt reports taking them regularly. Pt. denies complication/adverse reaction today.    follow-up on  thyroid. The patient has a history of hypothyroidism for many years. It has been stable recently. Pt. denies any change in  voice, loss of hair, heat or cold intolerance. Energy level has been adequate to good. Patient denies constipation and diarrhea. No myxedema. Medication is as noted below. Verified that pt is taking it daily on an empty stomach. Well tolerated.  Seen in E.D recently for gross hematuria.  Just finishing up his antibiotic.  Says the problem has cleared.  Due for recheck of urine today. Patient mentions frequent constipation.  No relief with over-the-counter remedies. History Adonte has a past medical history of Anemia, CAD (coronary artery disease), Carotid stenosis, Colon cancer ([place]), DM2 (diabetes mellitus, type 2) ([place]), Essential hypertension, HLD (hyperlipidemia),  Ischemic cardiomyopathy, and Status post partial colectomy (11/19/2015).   He has a past surgical history that includes Cataract extraction w/ intraocular lens  implant, bilateral; Colectomy; Coronary angioplasty with stent; Inguinal hernia repair (Left); 25 gauge pars plana vitrectomy with 20 gauge mvr port (Left, 03/20/2017); and 25 gauge pars plana vitrectomy with 20 gauge mvr port (Left, 03/20/2017).   His family history includes Hypertension in his father.He reports that he quit smoking about 22 years ago. His smoking use included cigarettes. He has never used smokeless tobacco. He reports that he does not drink alcohol and does not use drugs.  Current Outpatient Medications on File Prior to Visit  Medication Sig Dispense Refill   aspirin 81 MG EC tablet Take 81 mg by mouth daily.     cephALEXin (KEFLEX) 500 MG capsule Take 1 capsule (500 mg total) by mouth 4 (four) times daily. 28 capsule 0   glucose blood (ACCU-CHEK GUIDE) test strip TEST BLOOD SUGAR TWICE DAILY AS DIRECTED Dx E11.9 200 strip 3   levothyroxine (SYNTHROID) 100 MCG tablet Take 1 tablet (100 mcg total) by mouth daily. 90 tablet 2   nitroGLYCERIN (NITROSTAT) 0.4 MG SL tablet DISSOLVE 1 TABLET UNDER TONGUE EVERY 5 MINUTES AS NEEDED FOR CHEST PAIN UP TO 3 DOSES 25 tablet 3   sitaGLIPtin (JANUVIA) 100 MG tablet Take 1 tablet (100 mg total) by mouth daily. 90 tablet 3   tiotropium (SPIRIVA HANDIHALER) 18 MCG inhalation capsule Place 1 capsule (18 mcg total) into inhaler and inhale daily as needed (shortness of breath). 90 capsule 3   No current facility-administered medications on file prior to visit.    ROS Review of Systems  Constitutional:  Negative for fever.  Respiratory:  Negative for shortness of breath.   Cardiovascular:  Negative for chest pain.  Musculoskeletal:  Negative for arthralgias.  Skin:  Negative for rash.   Objective:  BP 133/75   Pulse 100   Temp 98.1 F (36.7 C)   Ht $R'5\' 10"'OG$  (1.778 m)   Wt 153 lb  (69.4 kg)   SpO2 95%   BMI 21.95 kg/m   BP Readings from Last 3 Encounters:  04/26/21 133/75  04/19/21 136/90  01/24/21 118/65    Wt Readings from Last 3 Encounters:  04/26/21 153 lb (69.4 kg)  04/19/21 176 lb 2.4 oz (79.9 kg)  01/24/21 155 lb 12.8 oz (70.7 kg)     Physical Exam Constitutional:      General: He is not in acute distress.    Appearance: He is well-developed.  HENT:     Head: Normocephalic and atraumatic.     Right Ear: External ear normal.     Left Ear: External ear normal.     Ears:     Comments: Hard of hearing     Nose: Nose normal.  Eyes:     Conjunctiva/sclera: Conjunctivae normal.     Pupils: Pupils are equal, round, and reactive to light.  Cardiovascular:     Rate and Rhythm: Normal rate and regular rhythm.     Heart sounds: Normal heart sounds. No murmur heard. Pulmonary:     Effort: Pulmonary effort is normal. No respiratory distress.     Breath sounds: Normal breath sounds. No wheezing or rales.  Abdominal:     Palpations: Abdomen is soft.     Tenderness: There is no abdominal tenderness.  Musculoskeletal:        General: Normal range of motion.     Cervical back: Normal range of motion and neck supple.  Skin:    General: Skin is warm and dry.  Neurological:     Mental Status: He is alert and oriented to person, place, and time.     Deep Tendon Reflexes: Reflexes are normal and symmetric.  Psychiatric:        Behavior: Behavior normal.        Thought Content: Thought content normal.        Judgment: Judgment normal.    Diabetic Foot Exam - Simple   No data filed       Assessment & Plan:   Elwyn was seen today for medical management of chronic issues.  Diagnoses and all orders for this visit:  Hypothyroidism, unspecified type -     TSH + free T4  DM type 2 with diabetic dyslipidemia ([place]) -     Bayer DCA Hb A1c Waived -     CBC with Differential/Platelet  Hypertension associated with diabetes ([place]) -      CMP14+EGFR  Carotid artery disease, unspecified laterality, unspecified type ([place]) -     Lipid panel  Essential hypertension -     carvedilol (COREG) 6.25 MG tablet; Take 1 tablet (6.25 mg total) by mouth 2 (two) times daily with a meal. -     losartan (COZAAR) 50 MG tablet; Take 1 tablet (50 mg total) by mouth daily. Please schedule appt for future refills. 1st attempt.  Diabetic neuropathy with neurologic complication (HCC) -     carvedilol (COREG) 6.25 MG tablet; Take 1 tablet (6.25 mg total) by mouth 2 (two) times daily with a meal. -     losartan (COZAAR) 50 MG tablet; Take 1 tablet (50 mg total) by  mouth daily. Please schedule appt for future refills. 1st attempt.  Gross hematuria  Other orders -     amLODipine (NORVASC) 2.5 MG tablet; Take 1 tablet (2.5 mg total) by mouth daily. TAKE 1 TABLET EVERY DAY -     famotidine (PEPCID) 20 MG tablet; Take 2 tablets (40 mg total) by mouth daily. -     furosemide (LASIX) 40 MG tablet; TAKE 1 TABLET EVERY DAY AS NEEDED  (INCREASED  WEIGHT  GAIN OR SWELLING)  I have changed Jeneen Rinks H. Sparger's carvedilol. I am also having him maintain his aspirin, Spiriva HandiHaler, Accu-Chek Guide, levothyroxine, nitroGLYCERIN, sitaGLIPtin, cephALEXin, amLODipine, famotidine, furosemide, and losartan.  Meds ordered this encounter  Medications   carvedilol (COREG) 6.25 MG tablet    Sig: Take 1 tablet (6.25 mg total) by mouth 2 (two) times daily with a meal.    Dispense:  180 tablet    Refill:  2   amLODipine (NORVASC) 2.5 MG tablet    Sig: Take 1 tablet (2.5 mg total) by mouth daily. TAKE 1 TABLET EVERY DAY    Dispense:  90 tablet    Refill:  1   famotidine (PEPCID) 20 MG tablet    Sig: Take 2 tablets (40 mg total) by mouth daily.    Dispense:  180 tablet    Refill:  2   furosemide (LASIX) 40 MG tablet    Sig: TAKE 1 TABLET EVERY DAY AS NEEDED  (INCREASED  WEIGHT  GAIN OR SWELLING)    Dispense:  90 tablet    Refill:  3   losartan (COZAAR) 50 MG  tablet    Sig: Take 1 tablet (50 mg total) by mouth daily. Please schedule appt for future refills. 1st attempt.    Dispense:  90 tablet    Refill:  1     Follow-up: No follow-ups on file.  Claretta Fraise, M.D.

## 2021-04-27 ENCOUNTER — Other Ambulatory Visit: Payer: Self-pay | Admitting: Family Medicine

## 2021-04-27 DIAGNOSIS — E039 Hypothyroidism, unspecified: Secondary | ICD-10-CM

## 2021-04-27 LAB — CMP14+EGFR
ALT: 14 IU/L (ref 0–44)
AST: 21 IU/L (ref 0–40)
Albumin/Globulin Ratio: 1.7 (ref 1.2–2.2)
Albumin: 4.6 g/dL (ref 3.5–4.6)
Alkaline Phosphatase: 82 IU/L (ref 44–121)
BUN/Creatinine Ratio: 22 (ref 10–24)
BUN: 24 mg/dL (ref 10–36)
Bilirubin Total: 0.5 mg/dL (ref 0.0–1.2)
CO2: 27 mmol/L (ref 20–29)
Calcium: 10.1 mg/dL (ref 8.6–10.2)
Chloride: 101 mmol/L (ref 96–106)
Creatinine, Ser: 1.11 mg/dL (ref 0.76–1.27)
Globulin, Total: 2.7 g/dL (ref 1.5–4.5)
Glucose: 151 mg/dL — ABNORMAL HIGH (ref 70–99)
Potassium: 5.4 mmol/L — ABNORMAL HIGH (ref 3.5–5.2)
Sodium: 138 mmol/L (ref 134–144)
Total Protein: 7.3 g/dL (ref 6.0–8.5)
eGFR: 61 mL/min/{1.73_m2} (ref >59)

## 2021-04-27 LAB — LIPID PANEL
Chol/HDL Ratio: 4.1 ratio (ref 0.0–5.0)
Cholesterol, Total: 207 mg/dL — ABNORMAL HIGH (ref 100–199)
HDL: 51 mg/dL (ref >39)
LDL Chol Calc (NIH): 135 mg/dL — ABNORMAL HIGH (ref 0–99)
Triglycerides: 118 mg/dL (ref 0–149)
VLDL Cholesterol Cal: 21 mg/dL (ref 5–40)

## 2021-04-27 LAB — CBC WITH DIFFERENTIAL/PLATELET
Basophils Absolute: 0.1 10*3/uL (ref 0.0–0.2)
Basos: 1 %
EOS (ABSOLUTE): 0.1 10*3/uL (ref 0.0–0.4)
Eos: 2 %
Hematocrit: 45.2 % (ref 37.5–51.0)
Hemoglobin: 15.2 g/dL (ref 13.0–17.7)
Immature Grans (Abs): 0 10*3/uL (ref 0.0–0.1)
Immature Granulocytes: 0 %
Lymphocytes Absolute: 0.9 10*3/uL (ref 0.7–3.1)
Lymphs: 13 %
MCH: 30.9 pg (ref 26.6–33.0)
MCHC: 33.6 g/dL (ref 31.5–35.7)
MCV: 92 fL (ref 79–97)
Monocytes Absolute: 0.6 10*3/uL (ref 0.1–0.9)
Monocytes: 9 %
Neutrophils Absolute: 4.7 10*3/uL (ref 1.4–7.0)
Neutrophils: 75 %
Platelets: 186 10*3/uL (ref 150–450)
RBC: 4.92 x10E6/uL (ref 4.14–5.80)
RDW: 12.2 % (ref 11.6–15.4)
WBC: 6.3 10*3/uL (ref 3.4–10.8)

## 2021-04-27 LAB — TSH+FREE T4
Free T4: 1.46 ng/dL (ref 0.82–1.77)
TSH: 6.28 u[IU]/mL — ABNORMAL HIGH (ref 0.450–4.500)

## 2021-04-27 MED ORDER — LEVOTHYROXINE SODIUM 112 MCG PO TABS: 112.0000 ug | ORAL_TABLET | Freq: Every day | ORAL | 0 refills | Status: DC

## 2021-06-22 ENCOUNTER — Other Ambulatory Visit: Payer: Medicare HMO

## 2021-06-22 ENCOUNTER — Ambulatory Visit: Payer: Medicare HMO | Admitting: Family Medicine

## 2021-06-22 DIAGNOSIS — E039 Hypothyroidism, unspecified: Secondary | ICD-10-CM | POA: Diagnosis not present

## 2021-06-23 ENCOUNTER — Ambulatory Visit (INDEPENDENT_AMBULATORY_CARE_PROVIDER_SITE_OTHER): Payer: Medicare HMO | Admitting: Family Medicine

## 2021-06-23 ENCOUNTER — Encounter: Payer: Self-pay | Admitting: Family Medicine

## 2021-06-23 VITALS — BP 127/65 | HR 94 | Temp 97.6°F | Ht 70.0 in | Wt 154.0 lb

## 2021-06-23 DIAGNOSIS — G3184 Mild cognitive impairment, so stated: Secondary | ICD-10-CM

## 2021-06-23 DIAGNOSIS — E039 Hypothyroidism, unspecified: Secondary | ICD-10-CM | POA: Diagnosis not present

## 2021-06-23 LAB — TSH+FREE T4
Free T4: 1.38 ng/dL (ref 0.82–1.77)
TSH: 9.41 u[IU]/mL — ABNORMAL HIGH (ref 0.450–4.500)

## 2021-06-23 MED ORDER — LEVOTHYROXINE SODIUM 137 MCG PO TABS: 137.0000 ug | ORAL_TABLET | Freq: Every day | ORAL | 1 refills | Status: DC

## 2021-06-23 NOTE — Progress Notes (Signed)
Subjective:  Patient ID: Sean Hartman, male    DOB: [birth year]  Age: 88 y.o. MRN: [id number]  CC: Hypothyroidism   HPI Sean Hartman presents for  follow-up on  thyroid. The patient has a history of hypothyroidism for many years. It has been stable recently. Pt. denies any change in  voice, loss of hair. He is experiencing cold intolerance. Energy level has beenlow. Patient having intermittent constipation. No diarrhea. No myxedema. Medication is as noted below. Verified that pt is taking it daily on an empty stomach. Well tolerated.  Son helps with history. Dad not eating a good diet wants meals on wheels. Also getting more forgetful & repetitive.    Depression screen [facility] 2/9 06/23/2021 04/26/2021 01/24/2021  Decreased Interest 0 0 1  Down, Depressed, Hopeless 0 0 0  PHQ - 2 Score 0 0 1  Altered sleeping - - 0  Tired, decreased energy - - 3  Change in appetite - - 0  Feeling bad or failure about yourself  - - 0  Trouble concentrating - - 0  Moving slowly or fidgety/restless - - 0  Suicidal thoughts - - 0  PHQ-9 Score - - 4  Difficult doing work/chores - - Somewhat difficult    History Sean Hartman has a past medical history of Anemia, CAD (coronary artery disease), Carotid stenosis, Colon cancer ([place]), DM2 (diabetes mellitus, type 2) ([place]), Essential hypertension, HLD (hyperlipidemia), Ischemic cardiomyopathy, and Status post partial colectomy (11/19/2015).   He has a past surgical history that includes Cataract extraction w/ intraocular lens  implant, bilateral; Colectomy; Coronary angioplasty with stent; Inguinal hernia repair (Left); 25 gauge pars plana vitrectomy with 20 gauge mvr port (Left, 03/20/2017); and 25 gauge pars plana vitrectomy with 20 gauge mvr port (Left, 03/20/2017).   His family history includes Hypertension in his father.He reports that he quit smoking about 22 years ago. His smoking use included cigarettes. He has never used smokeless tobacco. He reports that  he does not drink alcohol and does not use drugs.    ROS Review of Systems  Constitutional:  Negative for fever.  Respiratory:  Negative for shortness of breath.   Cardiovascular:  Negative for chest pain.  Musculoskeletal:  Negative for arthralgias.  Skin:  Negative for rash.   Objective:  BP 127/65    Pulse 94    Temp 97.6 F (36.4 C)    Ht 5\' 10"  (1.778 m)    Wt 154 lb (69.9 kg)    SpO2 96%    BMI 22.10 kg/m   BP Readings from Last 3 Encounters:  06/23/21 127/65  04/26/21 133/75  04/19/21 136/90    Wt Readings from Last 3 Encounters:  06/23/21 154 lb (69.9 kg)  04/26/21 153 lb (69.4 kg)  04/19/21 176 lb 2.4 oz (79.9 kg)     Physical Exam Constitutional:      General: He is not in acute distress.    Appearance: He is well-developed.  HENT:     Head: Normocephalic and atraumatic.     Right Ear: External ear normal.     Left Ear: External ear normal.     Nose: Nose normal.  Eyes:     Conjunctiva/sclera: Conjunctivae normal.     Pupils: Pupils are equal, round, and reactive to light.  Cardiovascular:     Rate and Rhythm: Normal rate and regular rhythm.     Heart sounds: Normal heart sounds. No murmur heard. Pulmonary:     Effort: Pulmonary effort is normal.  No respiratory distress.     Breath sounds: Normal breath sounds. No wheezing or rales.  Abdominal:     Palpations: Abdomen is soft.     Tenderness: There is no abdominal tenderness.  Musculoskeletal:        General: Normal range of motion.     Cervical back: Normal range of motion and neck supple.  Skin:    General: Skin is warm and dry.  Neurological:     Mental Status: He is alert and oriented to person, place, and time.     Deep Tendon Reflexes: Reflexes are normal and symmetric.  Psychiatric:        Behavior: Behavior normal.        Thought Content: Thought content normal.        Judgment: Judgment normal.      Assessment & Plan:   Sean Hartman was seen today for hypothyroidism.  Diagnoses and  all orders for this visit:  Mild cognitive impairment -     AMB Referral to [place]  Acquired hypothyroidism -     levothyroxine (SYNTHROID) 137 MCG tablet; Take 1 tablet (137 mcg total) by mouth daily. Replaces 112 mg dose. Please cancel it. -     [facility]  Hypothyroidism, unspecified type -     levothyroxine (SYNTHROID) 137 MCG tablet; Take 1 tablet (137 mcg total) by mouth daily. Replaces 112 mg dose. Please cancel it. -     AMB Referral to [place]       I have changed Sean Hartman's levothyroxine. I am also having him maintain his aspirin, Spiriva HandiHaler, Accu-Chek Guide, nitroGLYCERIN, sitaGLIPtin, carvedilol, amLODipine, famotidine, furosemide, losartan, and linaclotide.  Allergies as of 06/23/2021   No Known Allergies      Medication List        Accurate as of June 23, 2021  2:10 PM. If you have any questions, ask your nurse or doctor.          Accu-Chek Guide test strip Generic drug: glucose blood TEST BLOOD SUGAR TWICE DAILY AS DIRECTED Dx E11.9   amLODipine 2.5 MG tablet Commonly known as: NORVASC Take 1 tablet (2.5 mg total) by mouth daily. TAKE 1 TABLET EVERY DAY   aspirin 81 MG EC tablet Take 81 mg by mouth daily.   carvedilol 6.25 MG tablet Commonly known as: COREG Take 1 tablet (6.25 mg total) by mouth 2 (two) times daily with a meal.   famotidine 20 MG tablet Commonly known as: PEPCID Take 2 tablets (40 mg total) by mouth daily.   furosemide 40 MG tablet Commonly known as: LASIX TAKE 1 TABLET EVERY DAY AS NEEDED  (INCREASED  WEIGHT  GAIN OR SWELLING)   levothyroxine 137 MCG tablet Commonly known as: SYNTHROID Take 1 tablet (137 mcg total) by mouth daily. Replaces 112 mg dose. Please cancel it. What changed:  medication strength how much to take additional instructions Changed by: Claretta Fraise, MD   linaclotide 72 MCG capsule Commonly known as:  Linzess Take 1 capsule (72 mcg total) by mouth daily before breakfast. To keep bowels soft and regular   losartan 50 MG tablet Commonly known as: COZAAR Take 1 tablet (50 mg total) by mouth daily. Please schedule appt for future refills. 1st attempt.   nitroGLYCERIN 0.4 MG SL tablet Commonly known as: NITROSTAT DISSOLVE 1 TABLET UNDER TONGUE EVERY 5 MINUTES AS NEEDED FOR CHEST PAIN UP TO 3 DOSES   sitaGLIPtin 100 MG tablet Commonly  known as: Januvia Take 1 tablet (100 mg total) by mouth daily.   Spiriva HandiHaler 18 MCG inhalation capsule Generic drug: tiotropium Place 1 capsule (18 mcg total) into inhaler and inhale daily as needed (shortness of breath).         Follow-up: Return in about 2 months (around 08/21/2021) for diabetes.  Claretta Fraise, M.D.

## 2021-06-24 ENCOUNTER — Telehealth: Payer: Self-pay

## 2021-06-24 NOTE — Chronic Care Management (AMB) (Signed)
Chronic Care Management   Note  06/24/2021 Name: LINWOOD GULLIKSON MRN: [id number] DOB: 1922/11/09  Nikki Dom Kertz is a 88 y.o. year old male who is a primary care patient of Stacks, Cletus Gash, MD. I reached out to [name] by phone today in response to a referral sent by Mr. Brenson Hartman Stankiewicz's PCP.  Mr. Pritt was given information about Chronic Care Management services today including:  CCM service includes personalized support from designated clinical staff supervised by his physician, including individualized plan of care and coordination with other care providers 24/7 contact phone numbers for assistance for urgent and routine care needs. Service will only be billed when office clinical staff spend 20 minutes or more in a month to coordinate care. Only one practitioner may furnish and bill the service in a calendar month. The patient may stop CCM services at any time (effective at the end of the month) by phone call to the office staff. The patient is responsible for co-pay (up to 20% after annual deductible is met) if co-pay is required by the individual health plan.   Patient agreed to services and verbal consent obtained.   Follow up plan: Telephone appointment with care management team member scheduled for: RN CM 06/28/2021 LCSW 07/07/2021  Noreene Larsson, [place], [place], [place] [address] Direct Dial: [phone] [email or web address] Website: [email or web address]

## 2021-06-28 ENCOUNTER — Ambulatory Visit (INDEPENDENT_AMBULATORY_CARE_PROVIDER_SITE_OTHER): Payer: Medicare HMO | Admitting: *Deleted

## 2021-06-28 ENCOUNTER — Telehealth: Payer: Self-pay

## 2021-06-28 DIAGNOSIS — E1169 Type 2 diabetes mellitus with other specified complication: Secondary | ICD-10-CM

## 2021-06-28 DIAGNOSIS — E1159 Type 2 diabetes mellitus with other circulatory complications: Secondary | ICD-10-CM

## 2021-06-28 DIAGNOSIS — E039 Hypothyroidism, unspecified: Secondary | ICD-10-CM

## 2021-06-28 NOTE — Chronic Care Management (AMB) (Signed)
Chronic Care Management   CCM RN Visit Note  06/28/2021 Name: Sean Hartman MRN: [id number] DOB: 06/26/1922  Subjective: Sean Hartman is a 88 y.o. year old male who is a primary care patient of Stacks, Sean Gash, MD. The care management team was consulted for assistance with disease management and care coordination needs.    Engaged with patient's son, [name], by telephone  for initial visit in response to provider referral for case management and/or care coordination services.   Consent to Services:  The patient was given the following information about Chronic Care Management services today, agreed to services, and gave verbal consent: 1. CCM service includes personalized support from designated clinical staff supervised by the primary care provider, including individualized plan of care and coordination with other care providers 2. 24/7 contact phone numbers for assistance for urgent and routine care needs. 3. Service will only be billed when office clinical staff spend 20 minutes or more in a month to coordinate care. 4. Only one practitioner may furnish and bill the service in a calendar month. 5.The patient may stop CCM services at any time (effective at the end of the month) by phone call to the office staff. 6. The patient will be responsible for cost sharing (co-pay) of up to 20% of the service fee (after annual deductible is met). Patient agreed to services and consent obtained.  Patient agreed to services and verbal consent obtained.   Assessment: Review of patient past medical history, allergies, medications, health status, including review of consultants reports, laboratory and other test data, was performed as part of comprehensive evaluation and provision of chronic care management services.   SDOH (Social Determinants of Health) assessments and interventions performed:  SDOH Interventions    Flowsheet Row Most Recent Value  SDOH Interventions   Social Connections  Interventions Refer to [place], Other (Comment)  [ADTS Meals with Friends Program]        CCM Care Plan  No Known Allergies  Outpatient Encounter Medications as of 06/28/2021  Medication Sig Note   amLODipine (NORVASC) 2.5 MG tablet Take 1 tablet (2.5 mg total) by mouth daily. TAKE 1 TABLET EVERY DAY    aspirin 81 MG EC tablet Take 81 mg by mouth daily.    carvedilol (COREG) 6.25 MG tablet Take 1 tablet (6.25 mg total) by mouth 2 (two) times daily with a meal.    famotidine (PEPCID) 20 MG tablet Take 2 tablets (40 mg total) by mouth daily.    furosemide (LASIX) 40 MG tablet TAKE 1 TABLET EVERY DAY AS NEEDED  (INCREASED  WEIGHT  GAIN OR SWELLING)    glucose blood (ACCU-CHEK GUIDE) test strip TEST BLOOD SUGAR TWICE DAILY AS DIRECTED Dx E11.9    levothyroxine (SYNTHROID) 137 MCG tablet Take 1 tablet (137 mcg total) by mouth daily. Replaces 112 mg dose. Please cancel it.    linaclotide (LINZESS) 72 MCG capsule Take 1 capsule (72 mcg total) by mouth daily before breakfast. To keep bowels soft and regular    losartan (COZAAR) 50 MG tablet Take 1 tablet (50 mg total) by mouth daily. Please schedule appt for future refills. 1st attempt.    nitroGLYCERIN (NITROSTAT) 0.4 MG SL tablet DISSOLVE 1 TABLET UNDER TONGUE EVERY 5 MINUTES AS NEEDED FOR CHEST PAIN UP TO 3 DOSES    sitaGLIPtin (JANUVIA) 100 MG tablet Take 1 tablet (100 mg total) by mouth daily. 06/28/2021: Taking 1/2 tablet if blood sugar is elevated and does not take if blood sugar is  within normal range for him   tiotropium (SPIRIVA HANDIHALER) 18 MCG inhalation capsule Place 1 capsule (18 mcg total) into inhaler and inhale daily as needed (shortness of breath).    No facility-administered encounter medications on file as of 06/28/2021.    Patient Active Problem List   Diagnosis Date Noted   Hypothyroidism 10/10/2017   Chronic diastolic heart failure ([place]) 10/03/2017   Ischemic cardiomyopathy 10/03/2017   First degree heart  block 10/03/2017   Retained lens material following cataract surgery of left eye 03/20/2017   Diabetic neuropathy with neurologic complication ([place]) 71/69/6789   Gastroesophageal reflux disease 07/12/2016   Chronic fatigue, unspecified 11/03/2015   Hilar mass 11/03/2015   Iron deficiency anemia 11/03/2015   CAFL (chronic airflow limitation) ([place]) 12/17/2013   Chronic obstructive pulmonary disease ([place]) 12/17/2013   DM type 2 with diabetic dyslipidemia ([place]) 09/17/2013   Hypertension associated with diabetes ([place]) 09/17/2013   Carotid artery disease ([place]) 12/27/2009   DIABETES MELLITUS, TYPE II 04/30/2009   HYPERLIPIDEMIA 04/30/2009   Essential hypertension 04/30/2009   Coronary artery disease of native artery of native heart with stable angina pectoris ([place]) 04/30/2009    Conditions to be addressed/monitored:HTN, DMII, Hypothyroidism, and social isolation  Care Plan : [facility]  Updates made by Ilean China, RN since 06/28/2021 12:00 AM     Problem: Chronic Disease Management Needs   Priority: High  Onset Date: 06/28/2021     Long-Range Goal: Patient/Son will work with RN Care Manager Regarding Care Management and Care Coordination Associated with HTN, DM, COPD, hypothyroidism, social isolation, and Advanced Age   Start Date: 06/28/2021  Expected End Date: 06/28/2022  This Visit's Progress: On track  Priority: High  Note:   Current Barriers:  Chronic Disease Management support and education needs related to HTN, COPD, and DMII Advanced age Lack of socialization  Risk for falls  RNCM Clinical Goal(s):  Patient will continue to work with Consulting civil engineer and/or Social Worker to address care management and care coordination needs related to HTN, COPD, DMII, and advanced age as evidenced by adherence to CM Team Scheduled appointments     work with [employer] care guide to address needs related to Social Isolation and difficulty with food preparation as evidenced by  patient and/or community resource care guide support    through collaboration with Consulting civil engineer, provider, and care team.   Interventions: 1:1 collaboration with primary care provider regarding development and update of comprehensive plan of care as evidenced by provider attestation and co-signature Inter-disciplinary care team collaboration (see longitudinal plan of care) Evaluation of current treatment plan related to  self management and patient's adherence to plan as established by provider Discussed referral to LCSW. They do not feel this is needed at this time but will engage with LCSW in the future if needed.   Diabetes Interventions:  (Status:  New goal.) Long Term Goal Assessed patient's understanding of A1c goal: <8% Provided education to patient about basic DM disease process Reviewed medications with patient and discussed importance of medication adherence Counseled on importance of regular laboratory monitoring as prescribed Discussed plans with patient for ongoing care management follow up and provided patient with direct contact information for care management team Advised patient, providing education and rationale, to check cbg daily and record, calling [phone] for findings outside established parameters Review of patient status, including review of consultants reports, relevant laboratory and other test results, and medications completed Assessed social determinant of health barriers Reviewed  upcoming appointments.  Assessed transportation needs. Patient drives himself or his son will come with him.   Falls Interventions:  (Status:  New goal.) Long Term Goal Advised patient of importance of notifying provider of falls Assessed for falls since last encounter Assessed social determinant of health barriers Discussed mobility and ability to perform ADLs Discussed and encouraged use of assistive devices: Walker and shower chair Provided verbal education on fall  prevention strategies   SDOH Barriers (Status:  New goal.) Short Term Goal Patient interviewed and SDOH assessment performed        SDOH Interventions    Flowsheet Row Most Recent Value  SDOH Interventions   Social Connections Interventions Refer to [facility], Other (Comment)  [ADTS Meals with Friends Program]     Provided patient with information about Congregational Nursing, Palliative Care, and ADTS Meals with Friends Program   Discussed dual referral to Care Guides for information on Meals on Wheels. They have been outreached and he has been put on the wait list which is 6 months long Discussed family/social support Socially isolated but son and grandsons each visit weekly and someone talks with him daily Discussed transportation Patient drives locally to grocery store and [place] with [place] regarding referral process for referring to [place] with PCP regarding referral for Palliative Care Services   Hypothyroidism:  (Status: New goal.) Long Term Goal  Reviewed and discussed recent lab reports and elevated TSH Reviewed and discussed recent medication adjustment: levothyroxine 142mg to 1324m. Has not received 1374mfrom mail order yet.  Provided verbal education on importance of taking levothyroxine 30 minutes before other medications and food, especially acid reducers Discussed decreased energy level and fatigue. Hoping TSH correction will help with this.  Reviewed upcoming appt with PCP in March to rck TSH   Patient Goals/Self-Care Activities: Take medications as prescribed   Attend all scheduled provider appointments Perform all self care activities independently  Call provider office for new concerns or questions  check blood sugar at prescribed times: once daily and when you have symptoms of low or high blood sugar enter blood sugar readings and medication or insulin into daily log take the  blood sugar meter to all doctor visits Call RN Care Manager as needed [phone] Meals with Friends Program at [facility]/or [place]   Plan:Telephone follow up appointment with care management team member scheduled for:  07/12/21 with RNCM The patient has been provided with contact information for the care management team and has been advised to call with any health related questions or concerns.   KriChong SicilianSN, RN-BC Embedded Chronic Care Manager [facility] / [facility] Direct Dial: [phone]

## 2021-06-28 NOTE — Telephone Encounter (Signed)
° °  Telephone encounter was:  Successful.  06/28/2021 Name: Sean Hartman MRN: [id number] DOB: 11/05/1922  [name] is a 88 y.o. year old male who is a primary care patient of Stacks, Cletus Gash, MD . The community resource team was consulted for assistance with [place] guide performed the following interventions: Patient provided with information about care guide support team and interviewed to confirm resource needs.Spoke with son and he stated dad trys to cook for himself but isnt steady on his feet. He requested meals on wheels at this time  Follow Up Plan:  No further follow up planned at this time. The patient has been provided with needed resources.    [place], Care Management  [phone] [address], [place], [place] [address] Phone: [phone] Email: [email or web address]

## 2021-06-28 NOTE — Patient Instructions (Signed)
Visit Information  Patient Care Plan: [facility] Care Plan     Problem Identified: Chronic Disease Management Needs   Priority: High  Onset Date: 06/28/2021     Long-Range Goal: Patient/Son will work with RN Care Manager Regarding Care Management and Care Coordination Associated with HTN, DM, COPD, Hypothyroidism, social isolation, and Advanced Age   Start Date: 06/28/2021  Expected End Date: 06/28/2022  This Visit's Progress: On track  Priority: High  Note:   Current Barriers:  Chronic Disease Management support and education needs related to HTN, COPD, and DMII Advanced age Lack of socialization  Risk for falls  RNCM Clinical Goal(s):  Patient will continue to work with Consulting civil engineer and/or Social Worker to address care management and care coordination needs related to HTN, COPD, DMII, and hypothyroidism, social isolation, advanced age as evidenced by adherence to CM Team Scheduled appointments     work with community resource care guide to address needs related to Social Isolation and difficulty with food preparation as evidenced by patient and/or community resource care guide support    through collaboration with Consulting civil engineer, provider, and care team.   Interventions: 1:1 collaboration with primary care provider regarding development and update of comprehensive plan of care as evidenced by provider attestation and co-signature Inter-disciplinary care team collaboration (see longitudinal plan of care) Evaluation of current treatment plan related to  self management and patient's adherence to plan as established by provider Discussed referral to LCSW. They do not feel this is needed at this time but will engage with LCSW in the future if needed.   Diabetes Interventions:  (Status:  New goal.) Long Term Goal Assessed patient's understanding of A1c goal: <8% Provided education to patient about basic DM disease process Reviewed medications with patient and discussed importance of  medication adherence Counseled on importance of regular laboratory monitoring as prescribed Discussed plans with patient for ongoing care management follow up and provided patient with direct contact information for care management team Advised patient, providing education and rationale, to check cbg daily and record, calling [phone] for findings outside established parameters Review of patient status, including review of consultants reports, relevant laboratory and other test results, and medications completed Assessed social determinant of health barriers Reviewed upcoming appointments.  Assessed transportation needs. Patient drives himself or his son will come with him.   Falls Interventions:  (Status:  New goal.) Long Term Goal Advised patient of importance of notifying provider of falls Assessed for falls since last encounter Assessed social determinant of health barriers Discussed mobility and ability to perform ADLs Discussed and encouraged use of assistive devices: Walker and shower chair Provided verbal education on fall prevention strategies   SDOH Barriers (Status:  New goal.) Short Term Goal Patient interviewed and SDOH assessment performed        SDOH Interventions    Flowsheet Row Most Recent Value  SDOH Interventions   Social Connections Interventions Refer to [facility], Other (Comment)  [ADTS Meals with Friends Program]     Provided patient with information about Congregational Nursing, Palliative Care, and ADTS Meals with Friends Program   Discussed dual referral to Care Guides for information on Meals on Wheels. They have been outreached and he has been put on the wait list which is 6 months long Discussed family/social support Socially isolated but son and grandsons each visit weekly and someone talks with him daily Discussed transportation Patient drives locally to grocery store and [employer] with [employer]  regarding  referral process for referring to [place] with PCP regarding referral for Palliative Care Services   Hypothyroidism:  (Status: New goal.) Long Term Goal  Reviewed and discussed recent lab reports and elevated TSH Reviewed and discussed recent medication adjustment: levothyroxine 148mg to 1315m. Has not received 1371mfrom mail order yet.  Provided verbal education on importance of taking levothyroxine 30 minutes before other medications and food, especially acid reducers Discussed decreased energy level and fatigue. Hoping TSH correction will help with this.  Reviewed upcoming appt with PCP in March to rck TSH   Patient Goals/Self-Care Activities: Take medications as prescribed   Attend all scheduled provider appointments Perform all self care activities independently  Call provider office for new concerns or questions  check blood sugar at prescribed times: once daily and when you have symptoms of low or high blood sugar enter blood sugar readings and medication or insulin into daily log take the blood sugar meter to all doctor visits Call RN [place] needed [phone] Meals with Friends Program at [facility]/or [facility]        Patient verbalizes understanding of instructions and care plan provided today and agrees to view in [facility] MyChart status confirmed with patient.    Mr. LinMincheys given information about Chronic Care Management services today by me including:  CCM service includes personalized support from designated clinical staff supervised by his physician, including individualized plan of care and coordination with other care providers 24/7 contact phone numbers for assistance for urgent and routine care needs. Service will only be billed when office clinical staff spend 20 minutes or more in a month to coordinate  care. Only one practitioner may furnish and bill the service in a calendar month. The patient may stop CCM services at any time (effective at the end of the month) by phone call to the office staff. The patient will be responsible for cost sharing (co-pay) of up to 20% of the service fee (after annual deductible is met).   Patient agreed to services and verbal consent obtained.    KriChong SicilianSN, RN-BC Embedded Chronic Care Manager [facility] Direct Dial: [phone]

## 2021-07-05 ENCOUNTER — Telehealth: Payer: Self-pay | Admitting: *Deleted

## 2021-07-05 DIAGNOSIS — E1159 Type 2 diabetes mellitus with other circulatory complications: Secondary | ICD-10-CM | POA: Diagnosis not present

## 2021-07-05 DIAGNOSIS — I152 Hypertension secondary to endocrine disorders: Secondary | ICD-10-CM | POA: Diagnosis not present

## 2021-07-05 DIAGNOSIS — E039 Hypothyroidism, unspecified: Secondary | ICD-10-CM | POA: Diagnosis not present

## 2021-07-05 DIAGNOSIS — I5032 Chronic diastolic (congestive) heart failure: Secondary | ICD-10-CM

## 2021-07-05 DIAGNOSIS — E1169 Type 2 diabetes mellitus with other specified complication: Secondary | ICD-10-CM | POA: Diagnosis not present

## 2021-07-05 DIAGNOSIS — E785 Hyperlipidemia, unspecified: Secondary | ICD-10-CM

## 2021-07-05 DIAGNOSIS — I255 Ischemic cardiomyopathy: Secondary | ICD-10-CM

## 2021-07-05 NOTE — Telephone Encounter (Signed)
Since you saw him this month, you can sign the order I have pending

## 2021-07-05 NOTE — Telephone Encounter (Signed)
07/05/2021  I talked with Sean Hartman's son, Sean Hartman, regarding CCM services and community resources. He is interested in a palliative care assessment and consult.   Reason for Referral: Palliative Care Consult. CHF, DM, HTN, advanced age, and mild cognitive impairment  Referral discussed with patient: discussed with son, Mila Merry contact number of patient for referral team: [phone]  Sean Hartman) Has patient been seen by a specialist for this issue before: no Patient provider preference for referral: none Patient location preference for referral: None  Forwarding to PCP for review and referral if appropriate.   Chong Sicilian, BSN, RN-BC Embedded Chronic Care Manager [facility] / [place] [facility] Direct Dial: [phone]

## 2021-07-05 NOTE — Telephone Encounter (Signed)
Thanks, done.

## 2021-07-05 NOTE — Telephone Encounter (Signed)
Go ahead and set it up. Does it require a face to face?

## 2021-07-06 NOTE — Telephone Encounter (Signed)
Referral Coordinator aware to send on to Palliative care

## 2021-07-07 ENCOUNTER — Telehealth: Payer: Medicare HMO

## 2021-07-12 ENCOUNTER — Ambulatory Visit (INDEPENDENT_AMBULATORY_CARE_PROVIDER_SITE_OTHER): Payer: Medicare HMO | Admitting: *Deleted

## 2021-07-12 ENCOUNTER — Encounter: Payer: Self-pay | Admitting: *Deleted

## 2021-07-12 DIAGNOSIS — E1169 Type 2 diabetes mellitus with other specified complication: Secondary | ICD-10-CM

## 2021-07-12 DIAGNOSIS — G3184 Mild cognitive impairment, so stated: Secondary | ICD-10-CM

## 2021-07-12 DIAGNOSIS — I152 Hypertension secondary to endocrine disorders: Secondary | ICD-10-CM

## 2021-07-12 DIAGNOSIS — E785 Hyperlipidemia, unspecified: Secondary | ICD-10-CM

## 2021-07-12 DIAGNOSIS — E1159 Type 2 diabetes mellitus with other circulatory complications: Secondary | ICD-10-CM

## 2021-07-13 NOTE — Chronic Care Management (AMB) (Signed)
Chronic Care Management   CCM RN Visit Note  07/12/2021 Name: Sean Hartman MRN: [id number] DOB: 1922-07-21  Subjective: Sean Hartman is a 88 y.o. year old male who is a primary care patient of Stacks, Cletus Gash, MD. The care management team was consulted for assistance with disease management and care coordination needs.    Engaged with patient's son, [name], by telephone  for follow up visit in response to provider referral for case management and/or care coordination services.   Consent to Services:  The patient was given information about Chronic Care Management services, agreed to services, and gave verbal consent prior to initiation of services.  Please see initial visit note for detailed documentation.   Patient agreed to services and verbal consent obtained.   Assessment: Review of patient past medical history, allergies, medications, health status, including review of consultants reports, laboratory and other test data, was performed as part of comprehensive evaluation and provision of chronic care management services.   SDOH (Social Determinants of Health) assessments and interventions performed:    CCM Care Plan  No Known Allergies  Outpatient Encounter Medications as of 07/12/2021  Medication Sig Note   amLODipine (NORVASC) 2.5 MG tablet Take 1 tablet (2.5 mg total) by mouth daily. TAKE 1 TABLET EVERY DAY    aspirin 81 MG EC tablet Take 81 mg by mouth daily.    carvedilol (COREG) 6.25 MG tablet Take 1 tablet (6.25 mg total) by mouth 2 (two) times daily with a meal.    famotidine (PEPCID) 20 MG tablet Take 2 tablets (40 mg total) by mouth daily.    furosemide (LASIX) 40 MG tablet TAKE 1 TABLET EVERY DAY AS NEEDED  (INCREASED  WEIGHT  GAIN OR SWELLING)    glucose blood (ACCU-CHEK GUIDE) test strip TEST BLOOD SUGAR TWICE DAILY AS DIRECTED Dx E11.9    levothyroxine (SYNTHROID) 137 MCG tablet Take 1 tablet (137 mcg total) by mouth daily. Replaces 112 mg dose. Please cancel  it.    linaclotide (LINZESS) 72 MCG capsule Take 1 capsule (72 mcg total) by mouth daily before breakfast. To keep bowels soft and regular    losartan (COZAAR) 50 MG tablet Take 1 tablet (50 mg total) by mouth daily. Please schedule appt for future refills. 1st attempt.    nitroGLYCERIN (NITROSTAT) 0.4 MG SL tablet DISSOLVE 1 TABLET UNDER TONGUE EVERY 5 MINUTES AS NEEDED FOR CHEST PAIN UP TO 3 DOSES    sitaGLIPtin (JANUVIA) 100 MG tablet Take 1 tablet (100 mg total) by mouth daily. 06/28/2021: Taking 1/2 tablet if blood sugar is elevated and does not take if blood sugar is within normal range for him   tiotropium (SPIRIVA HANDIHALER) 18 MCG inhalation capsule Place 1 capsule (18 mcg total) into inhaler and inhale daily as needed (shortness of breath).    No facility-administered encounter medications on file as of 07/12/2021.    Patient Active Problem List   Diagnosis Date Noted   Hypothyroidism 10/10/2017   Chronic diastolic heart failure ([place]) 10/03/2017   Ischemic cardiomyopathy 10/03/2017   First degree heart block 10/03/2017   Retained lens material following cataract surgery of left eye 03/20/2017   Diabetic neuropathy with neurologic complication ([place]) 72/53/6644   Gastroesophageal reflux disease 07/12/2016   Chronic fatigue, unspecified 11/03/2015   Hilar mass 11/03/2015   Iron deficiency anemia 11/03/2015   CAFL (chronic airflow limitation) ([place]) 12/17/2013   Chronic obstructive pulmonary disease ([place]) 12/17/2013   DM type 2 with diabetic dyslipidemia ([place]) 09/17/2013  Hypertension associated with diabetes ([place]) 09/17/2013   Carotid artery disease ([place]) 12/27/2009   DIABETES MELLITUS, TYPE II 04/30/2009   HYPERLIPIDEMIA 04/30/2009   Essential hypertension 04/30/2009   Coronary artery disease of native artery of native heart with stable angina pectoris ([place]) 04/30/2009    Conditions to be addressed/monitored:HTN, COPD, DMII, Hypothyroidism, and Social Isolation, and Advance  Age  Care Plan : RNCM Care Plan  Updates made by Ilean China, RN since 07/13/2021 12:00 AM     Problem: Chronic Disease Management Needs   Priority: High  Onset Date: 06/28/2021     Long-Range Goal: Patient/Son will work with RN Care Manager Regarding Care Management and [place] with HTN, DM, COPD, Hypothyroidism, social isolation, and Advanced Age   Start Date: 06/28/2021  Expected End Date: 06/28/2022  This Visit's Progress: On track  Recent Progress: On track  Priority: High  Note:   Current Barriers:  Chronic Disease Management support and education needs related to HTN, COPD, and DMII Advanced age Lack of socialization  Risk for falls  RNCM Clinical Goal(s):  Patient will continue to work with Consulting civil engineer and/or Social Worker to address care management and care coordination needs related to HTN, COPD, DMII, and hypothyroidism, social isolation, advanced age as evidenced by adherence to CM Team Scheduled appointments     work with community resource care guide to address needs related to Social Isolation and difficulty with food preparation as evidenced by patient and/or community resource care guide support    through collaboration with Consulting civil engineer, provider, and care team.   Interventions: 1:1 collaboration with primary care provider regarding development and update of comprehensive plan of care as evidenced by provider attestation and co-signature Inter-disciplinary care team collaboration (see longitudinal plan of care) Evaluation of current treatment plan related to  self management and patient's adherence to plan as established by provider Discussed referral to LCSW. They do not feel this is needed at this time but will engage with LCSW in the future if needed.   Diabetes Interventions:  (Status:  Condition stable.  Not addressed this visit.) Long Term Goal Assessed patient's understanding of A1c goal: <8% Provided education to patient about  basic DM disease process Reviewed medications with patient and discussed importance of medication adherence Counseled on importance of regular laboratory monitoring as prescribed Discussed plans with patient for ongoing care management follow up and provided patient with direct contact information for care management team Advised patient, providing education and rationale, to check cbg daily and record, calling [phone] for findings outside established parameters Review of patient status, including review of consultants reports, relevant laboratory and other test results, and medications completed Assessed social determinant of health barriers Reviewed upcoming appointments.  Assessed transportation needs. Patient drives himself or his son will come with him.   Falls Interventions:  (Status:  Condition stable.  Not addressed this visit.) Long Term Goal Advised patient of importance of notifying provider of falls Assessed for falls since last encounter Assessed social determinant of health barriers Discussed mobility and ability to perform ADLs Discussed and encouraged use of assistive devices: Walker and shower chair Provided verbal education on fall prevention strategies   SDOH Barriers (Status:  Goal on track:  Yes.) Short Term Goal Patient interviewed and SDOH assessment performed        SDOH Interventions    Flowsheet Row Most Recent Value  SDOH Interventions   Social Connections Interventions Refer to Congregational Nursing, Other (Comment)  [[facility] Meals  with Friends Program]     Previously provided son, [name], with information about Congregational Nursing, Palliative Care, and [facility] Meals with Friends Program  Previously discussed dual referral to Care Guides for information on Meals on Wheels. They have been outreached and he has been put on the wait list which is 6 months long Discussed family/social support Socially isolated but son and grandsons each visit weekly and  someone talks with him daily Previously discussed transportation Patient drives locally to grocery store and [place] with [facility]. They do not offer any services that are needed at this time. Collaborated with PCP regarding referral for Palliative Care Services Patient has been outreached by Palliative Care through [facility]. Initial visit is scheduled for next week.   Hypothyroidism:  (Status: Condition stable. Not addressed this visit.) Long Term Goal  Reviewed and discussed recent lab reports and elevated TSH Reviewed and discussed recent medication adjustment: levothyroxine 112mcg to 155mcg. Has not received 123mcg from mail order yet.  Provided verbal education on importance of taking levothyroxine 30 minutes before other medications and food, especially acid reducers Discussed decreased energy level and fatigue. Hoping TSH correction will help with this.  Reviewed upcoming appt with PCP in March to rck TSH   Patient Goals/Self-Care Activities: Take medications as prescribed   Attend all scheduled provider appointments Perform all self care activities independently  Call provider office for new concerns or questions  check blood sugar at prescribed times: once daily and when you have symptoms of low or high blood sugar enter blood sugar readings and medication or insulin into daily log take the blood sugar meter to all doctor visits Call [place] as needed [phone] Consider Meals with Friends Program at [place] with [place] through [facility] follow up appointment with care management team member scheduled for:  08/10/21 with [facility] The patient has been provided with contact information for the care management team and has been advised to call with any health related questions or concerns.   Chong Sicilian, BSN, RN-BC Embedded Chronic Care  Manager [facility] / [place] Management Direct Dial: [phone]

## 2021-07-13 NOTE — Patient Instructions (Signed)
Visit Information   Patient Goals/Self-Care Activities: Take medications as prescribed   Attend all scheduled provider appointments Perform all self care activities independently  Call provider office for new concerns or questions  check blood sugar at prescribed times: once daily and when you have symptoms of low or high blood sugar enter blood sugar readings and medication or insulin into daily log take the blood sugar meter to all doctor visits Call [place] as needed [phone] Consider Meals with Friends Program at [place] with [place] through [facility]  Patient verbalizes understanding of instructions and care plan provided today and agrees to view in [place]. Active MyChart status confirmed with patient.    Plan:Telephone follow up appointment with care management team member scheduled for:  08/10/21 with RNCM The patient has been provided with contact information for the care management team and has been advised to call with any health related questions or concerns.   Chong Sicilian, BSN, RN-BC Embedded Chronic Care Manager [facility] / [place] Management Direct Dial: [phone]

## 2021-07-26 DIAGNOSIS — I5032 Chronic diastolic (congestive) heart failure: Secondary | ICD-10-CM | POA: Diagnosis not present

## 2021-07-26 DIAGNOSIS — Z515 Encounter for palliative care: Secondary | ICD-10-CM | POA: Diagnosis not present

## 2021-07-27 ENCOUNTER — Ambulatory Visit: Payer: Medicare HMO | Admitting: Family Medicine

## 2021-08-02 DIAGNOSIS — E039 Hypothyroidism, unspecified: Secondary | ICD-10-CM

## 2021-08-02 DIAGNOSIS — E1169 Type 2 diabetes mellitus with other specified complication: Secondary | ICD-10-CM

## 2021-08-08 ENCOUNTER — Emergency Department (HOSPITAL_COMMUNITY)
Admission: EM | Admit: 2021-08-08 | Discharge: 2021-08-08 | Disposition: A | Discharge status: Home / Self Care | Payer: Medicare HMO | Attending: Emergency Medicine | Admitting: Emergency Medicine

## 2021-08-08 ENCOUNTER — Encounter (HOSPITAL_COMMUNITY): Payer: Self-pay

## 2021-08-08 ENCOUNTER — Ambulatory Visit (HOSPITAL_COMMUNITY)
Admission: EM | Admit: 2021-08-08 | Discharge: 2021-08-08 | Disposition: A | Discharge status: Home / Self Care | Payer: Medicare HMO

## 2021-08-08 ENCOUNTER — Other Ambulatory Visit: Payer: Self-pay

## 2021-08-08 DIAGNOSIS — Z79899 Other long term (current) drug therapy: Secondary | ICD-10-CM | POA: Insufficient documentation

## 2021-08-08 DIAGNOSIS — R040 Epistaxis: Secondary | ICD-10-CM | POA: Insufficient documentation

## 2021-08-08 DIAGNOSIS — Z7982 Long term (current) use of aspirin: Secondary | ICD-10-CM | POA: Diagnosis not present

## 2021-08-08 LAB — CBC
HCT: 42.4 % (ref 39.0–52.0)
Hemoglobin: 14.1 g/dL (ref 13.0–17.0)
MCH: 32 pg (ref 26.0–34.0)
MCHC: 33.3 g/dL (ref 30.0–36.0)
MCV: 96.1 fL (ref 80.0–100.0)
Platelets: 178 10*3/uL (ref 150–400)
RBC: 4.41 MIL/uL (ref 4.22–5.81)
RDW: 12.8 % (ref 11.5–15.5)
WBC: 7.5 10*3/uL (ref 4.0–10.5)
nRBC: 0 % (ref 0.0–0.2)

## 2021-08-08 MED ORDER — LIDOCAINE-EPINEPHRINE (PF) 2 %-1:200000 IJ SOLN
20.0000 mL | Freq: Once | INTRAMUSCULAR | Status: AC
  Administered 2021-08-08: 20 mL
  Filled 2021-08-08: qty 20

## 2021-08-08 MED ORDER — OXYMETAZOLINE HCL 0.05 % NA SOLN
1.0000 | Freq: Once | NASAL | Status: AC
  Administered 2021-08-08: 1 via NASAL
  Filled 2021-08-08 (×2): qty 30

## 2021-08-08 NOTE — ED Triage Notes (Signed)
Pt reports epistaxis since 1520pm. Denies AC. Seen at [facility], no meds given, sent to ED.  ?

## 2021-08-08 NOTE — ED Provider Notes (Signed)
?[facility] ?Provider Note ? ? ?CSN: [id number] ?Arrival date & time: 08/08/21  1620 ? ?  ? ?History ? ?Chief Complaint  ?Patient presents with  ? Epistaxis  ? ? ?Sean Hartman is a 88 y.o. male. ? ?HPI ?Presents with his granddaughter in law who assists with the history. ?He presents due to left-sided epistaxis.  No lightheadedness, no syncope, but now over the past 3 hours he has had substantial bleeding from left side, refractory to pressure, temporary packing.  He was well prior to the onset, no clear precipitant. ?  ? ?Home Medications ?Prior to Admission medications   ?Medication Sig Start Date End Date Taking? Authorizing Provider  ?amLODipine (NORVASC) 2.5 MG tablet Take 1 tablet (2.5 mg total) by mouth daily. TAKE 1 TABLET EVERY DAY 04/26/21   Claretta Fraise, MD  ?aspirin 81 MG EC tablet Take 81 mg by mouth daily.    [provider]  ?carvedilol (COREG) 6.25 MG tablet Take 1 tablet (6.25 mg total) by mouth 2 (two) times daily with a meal. 04/26/21   Claretta Fraise, MD  ?famotidine (PEPCID) 20 MG tablet Take 2 tablets (40 mg total) by mouth daily. 04/26/21   Claretta Fraise, MD  ?furosemide (LASIX) 40 MG tablet TAKE 1 TABLET EVERY DAY AS NEEDED  (INCREASED  WEIGHT  GAIN OR SWELLING) 04/26/21   Claretta Fraise, MD  ?glucose blood (ACCU-CHEK GUIDE) test strip TEST BLOOD SUGAR TWICE DAILY AS DIRECTED Dx E11.9 12/14/20   Claretta Fraise, MD  ?levothyroxine (SYNTHROID) 137 MCG tablet Take 1 tablet (137 mcg total) by mouth daily. Replaces 112 mg dose. Please cancel it. 06/23/21   Claretta Fraise, MD  ?linaclotide Rolan Lipa) 72 MCG capsule Take 1 capsule (72 mcg total) by mouth daily before breakfast. To keep bowels soft and regular 04/26/21   Claretta Fraise, MD  ?losartan (COZAAR) 50 MG tablet Take 1 tablet (50 mg total) by mouth daily. Please schedule appt for future refills. 1st attempt. 04/26/21   Claretta Fraise, MD  ?nitroGLYCERIN (NITROSTAT) 0.4 MG SL tablet DISSOLVE  1 TABLET UNDER TONGUE EVERY 5 MINUTES AS NEEDED FOR CHEST PAIN UP TO 3 DOSES 12/14/20   Claretta Fraise, MD  ?sitaGLIPtin (JANUVIA) 100 MG tablet Take 1 tablet (100 mg total) by mouth daily. 01/24/21   Claretta Fraise, MD  ?tiotropium (SPIRIVA HANDIHALER) 18 MCG inhalation capsule Place 1 capsule (18 mcg total) into inhaler and inhale daily as needed (shortness of breath). 04/22/20   Claretta Fraise, MD  ?   ? ?Allergies    ?Patient has no known allergies.   ? ?Review of Systems   ?Review of Systems  ?Constitutional:   ?     Per HPI, otherwise negative  ?HENT:    ?     Per HPI, otherwise negative  ?Respiratory:    ?     Per HPI, otherwise negative  ?Cardiovascular:   ?     Per HPI, otherwise negative  ?Gastrointestinal:  Negative for vomiting.  ?Endocrine:  ?     Negative aside from HPI  ?Genitourinary:   ?     Neg aside from HPI   ?Musculoskeletal:   ?     Per HPI, otherwise negative  ?Skin: Negative.   ?Neurological:  Negative for syncope.  ?     Extremely poor hearing  ? ?Physical Exam ?Updated Vital Signs ?BP 116/69   Pulse (!) 55   Temp 98.2 ?F (36.8 ?C)   Resp 14   SpO2  98%  ?Physical Exam ?Vitals and nursing note reviewed.  ?Constitutional:   ?   General: He is not in acute distress. ?   Appearance: He is well-developed.  ?HENT:  ?   Head: Normocephalic and atraumatic.  ? ?Eyes:  ?   Conjunctiva/sclera: Conjunctivae normal.  ?Cardiovascular:  ?   Rate and Rhythm: Normal rate and regular rhythm.  ?Pulmonary:  ?   Effort: Pulmonary effort is normal. No respiratory distress.  ?   Breath sounds: No stridor.  ?Abdominal:  ?   General: There is no distension.  ?Skin: ?   General: Skin is warm and dry.  ?Neurological:  ?   Mental Status: He is alert and oriented to person, place, and time.  ?   Comments: Cranial nerves unremarkable aside from poor hearing, neurologic exam otherwise unremarkable  ? ? ?ED Results / Procedures / Treatments   ?Labs ?(all labs ordered are listed, but only abnormal results are  displayed) ?Labs Reviewed  ?CBC  ? ? ?EKG ?None ? ?Radiology ?No results found. ? ?Procedures ?.Epistaxis Management ? ?Date/Time: 08/08/2021 7:00 PM ?Performed by: Carmin Muskrat, MD ?Authorized by: Carmin Muskrat, MD  ? ?Consent:  ?  Consent obtained:  Verbal ?  Consent given by:  Patient ?  Risks, benefits, and alternatives were discussed: yes   ?  Risks discussed:  Infection, bleeding, nasal injury and pain ?  Alternatives discussed:  Alternative treatment ?Universal protocol:  ?  Procedure explained and questions answered to patient or proxy's satisfaction: yes   ?  Test results available: yes   ?  Required blood products, implants, devices, and special equipment available: yes   ?  Site/side marked: yes   ?  Immediately prior to procedure, a time out was called: yes   ?  Patient identity confirmed:  Verbally with patient ?Anesthesia:  ?  Anesthesia method:  Topical application ?  Topical anesthetic:  Lidocaine gel and epinephrine ?Procedure details:  ?  Treatment site:  L septum ?  Treatment method:  Silver nitrate ?  Treatment complexity:  Extensive ?  Treatment episode: initial   ?Post-procedure details:  ?  Assessment:  Bleeding stopped ?  Procedure completion:  Tolerated well, no immediate complications  ? ? ?Medications Ordered in ED ?Medications  ?oxymetazoline (AFRIN) 0.05 % nasal spray 1 spray (1 spray Each Nare Given 08/08/21 1645)  ?lidocaine-EPINEPHrine (XYLOCAINE W/EPI) 2 %-1:200000 (PF) injection 20 mL (20 mLs Other Given by Other 08/08/21 1821)  ? ? ?ED Course/ Medical Decision Making/ A&P ? This patient presents to the ED for concern of epistaxis, this involves an extensive number of treatment options, and is a complaint that carries with it a high risk of complications and morbidity.  The differential diagnosis includes exsanguination, ongoing bleeding, infection ? ? ?Co morbidities that complicate the patient evaluation ? ?Age, aspirin ? ? ?Social Determinants of Health: ? ?Age ? ? ?Additional  history obtained: ? ?Additional history and/or information obtained from granddaughter ?External records from outside source obtained and reviewed including history of bleeding episode, absence of syncope, fall ? ? ?After the initial evaluation, orders, including: Lidocaine with epinephrine, labs were initiated. ? ?The patient was also maintained on pulse oximetry. The readings were typically 99% room air ? ?                       On repeat evaluation of the patient improved ? ?Lab Tests: ? ?I personally interpreted labs.  The pertinent results  include: No substantial anemia on CBC ? ?8:11 PM ?Patient ambulatory, continues to have no evidence of bleeding, now 1 hour after procedure ? ?Dispostion / Final MDM: ? ?After consideration of the diagnostic results and the patient's response to treatment, is appropriate for discharge, after lengthy conversation on return precautions, home treatment, follow-up as needed.  Notably male presents with epistaxis.  After multiple packing attempts hemostasis was eventually achieved with silver nitrate, well-tolerated, no ongoing bleeding.  No evidence for complication, anemia, hemodynamic instability. ?Final Clinical Impression(s) / ED Diagnoses ?Final diagnoses:  ?Epistaxis  ? ?  ?Carmin Muskrat, MD ?08/08/21 2011 ? ?

## 2021-08-08 NOTE — ED Notes (Signed)
Afrin & rhino rocket unsuccessful in stopping epistaxis. Triage PA Shirlee Limerick aware, EDP to evaluate further ?

## 2021-08-08 NOTE — ED Provider Notes (Signed)
Pt presents with fairly brisk nosebleed. Has had this before. Is on aspirin. We are asking him to go to the ER; as long as stable, we will have them go directly there. Family is requesting we roll him down in a WC. ?  ?Barrett Henle, MD ?08/08/21 1610 ? ?

## 2021-08-08 NOTE — ED Triage Notes (Signed)
Pt presents with active nosebleeds x 1500. Pt grandson states he has had nose bleeds before and reports he has passed out.  ? ?Pt denies headaches.  ?

## 2021-08-08 NOTE — Discharge Instructions (Signed)
As discussed, please use the provided Afrin every 4 hours for the next 3 days.  Monitor your condition carefully and do not hesitate to return here if you develop new, or concerning changes in your condition. ?

## 2021-08-08 NOTE — ED Notes (Signed)
Patient is being discharged from the Urgent Care and sent to the Emergency Department via POV . Per Dr. Windy Carina, patient is in need of higher level of care due to severe nose bleeds. Patient is aware and verbalizes understanding of plan of care.  ?Vitals:  ? 08/08/21 1607  ?Pulse: 83  ?Resp: 20  ?SpO2: 98%  ? ? ?

## 2021-08-08 NOTE — ED Provider Triage Note (Signed)
Emergency Medicine Provider Triage Evaluation Note ? ?Sean Hartman , a 88 y.o. male  was evaluated in triage.  Nose bleed started at 1520. He was seen by urgent care and no intervention was done. They sent him immediately here. He is on aspirin but no other blood thinners. He has had nosebleeds before.  They do not remember what they had to do at that point, but they do remember that he lost much blood he passed out. ? ?Review of Systems  ?Positive: epistaxis ?Negative:  ? ?Physical Exam  ?BP 129/82   Pulse 88   Temp 98.2 ?F (36.8 ?C)   Resp 12   SpO2 96%  ?Gen:   Awake, no distress   ?Resp:  Normal effort  ?MSK:   Moves extremities without difficulty  ?Other:   ? ?Medical Decision Making  ?Medically screening exam initiated at 4:48 PM.  Appropriate orders placed.  Sean Hartman was informed that the remainder of the evaluation will be completed by another provider, this initial triage assessment does not replace that evaluation, and the importance of remaining in the ED until their evaluation is complete. ? ?Tried afrin. No relief.  ?Tried inserting rhinorocket here in triage. He immediately bled through this.  ?Discussed case with Dr. Vanita Panda who recommends he get next room for further intervention ?  ?Adolphus Birchwood, PA-C ?08/08/21 1723 ? ?

## 2021-08-10 ENCOUNTER — Ambulatory Visit (INDEPENDENT_AMBULATORY_CARE_PROVIDER_SITE_OTHER): Payer: Medicare HMO | Admitting: *Deleted

## 2021-08-10 DIAGNOSIS — I152 Hypertension secondary to endocrine disorders: Secondary | ICD-10-CM

## 2021-08-10 DIAGNOSIS — E1169 Type 2 diabetes mellitus with other specified complication: Secondary | ICD-10-CM

## 2021-08-10 DIAGNOSIS — E785 Hyperlipidemia, unspecified: Secondary | ICD-10-CM

## 2021-08-10 DIAGNOSIS — E1159 Type 2 diabetes mellitus with other circulatory complications: Secondary | ICD-10-CM

## 2021-08-10 DIAGNOSIS — G3184 Mild cognitive impairment, so stated: Secondary | ICD-10-CM

## 2021-08-10 NOTE — Patient Instructions (Signed)
Visit Information ? ?Patient Goals/Self-Care Activities: ?Take medications as prescribed   ?Attend all scheduled provider appointments ?Perform all self care activities independently  ?Call provider office for new concerns or questions  ?check blood sugar at prescribed times: once daily and when you have symptoms of low or high blood sugar ?enter blood sugar readings and medication or insulin into daily log ?take the blood sugar meter to all doctor visits ?Call RN Care Manager as needed [phone] ?Move carefully to avoid falls ? ?Patient verbalizes understanding of instructions and care plan provided today and agrees to view in [place]. Active MyChart status confirmed with patient.   ? ?Plan:Telephone follow up appointment with care management team member scheduled for:  09/09/21 with RNCM ?The patient has been provided with contact information for the care management team and has been advised to call with any health related questions or concerns.  ? ?Chong Sicilian, BSN, RN-BC ?Embedded Chronic Care Manager ?[place] / [place] Management ?Direct Dial: [phone] ?  ?

## 2021-08-10 NOTE — Chronic Care Management (AMB) (Signed)
Chronic Care Management   CCM RN Visit Note  08/10/2021 Name: Sean Hartman MRN: [id number] DOB: December 02, 1922  Subjective: Sean Hartman is a 88 y.o. year old male who is a primary care patient of Stacks, Cletus Gash, MD. The care management team was consulted for assistance with disease management and care coordination needs.    Engaged with patient's son, [name],  for follow up visit in response to provider referral for case management and/or care coordination services.   Consent to Services:  The patient was given information about Chronic Care Management services, agreed to services, and gave verbal consent prior to initiation of services.  Please see initial visit note for detailed documentation.   Patient agreed to services and verbal consent obtained.   Assessment: Review of patient past medical history, allergies, medications, health status, including review of consultants reports, laboratory and other test data, was performed as part of comprehensive evaluation and provision of chronic care management services.   SDOH (Social Determinants of Health) assessments and interventions performed:    CCM Care Plan  No Known Allergies  Outpatient Encounter Medications as of 08/10/2021  Medication Sig Note   amLODipine (NORVASC) 2.5 MG tablet Take 1 tablet (2.5 mg total) by mouth daily. TAKE 1 TABLET EVERY DAY    aspirin 81 MG EC tablet Take 81 mg by mouth daily.    carvedilol (COREG) 6.25 MG tablet Take 1 tablet (6.25 mg total) by mouth 2 (two) times daily with a meal.    famotidine (PEPCID) 20 MG tablet Take 2 tablets (40 mg total) by mouth daily.    furosemide (LASIX) 40 MG tablet TAKE 1 TABLET EVERY DAY AS NEEDED  (INCREASED  WEIGHT  GAIN OR SWELLING)    glucose blood (ACCU-CHEK GUIDE) test strip TEST BLOOD SUGAR TWICE DAILY AS DIRECTED Dx E11.9    levothyroxine (SYNTHROID) 137 MCG tablet Take 1 tablet (137 mcg total) by mouth daily. Replaces 112 mg dose. Please cancel it.     linaclotide (LINZESS) 72 MCG capsule Take 1 capsule (72 mcg total) by mouth daily before breakfast. To keep bowels soft and regular    losartan (COZAAR) 50 MG tablet Take 1 tablet (50 mg total) by mouth daily. Please schedule appt for future refills. 1st attempt.    nitroGLYCERIN (NITROSTAT) 0.4 MG SL tablet DISSOLVE 1 TABLET UNDER TONGUE EVERY 5 MINUTES AS NEEDED FOR CHEST PAIN UP TO 3 DOSES    sitaGLIPtin (JANUVIA) 100 MG tablet Take 1 tablet (100 mg total) by mouth daily. 06/28/2021: Taking 1/2 tablet if blood sugar is elevated and does not take if blood sugar is within normal range for him   tiotropium (SPIRIVA HANDIHALER) 18 MCG inhalation capsule Place 1 capsule (18 mcg total) into inhaler and inhale daily as needed (shortness of breath).    No facility-administered encounter medications on file as of 08/10/2021.    Patient Active Problem List   Diagnosis Date Noted   Hypothyroidism 10/10/2017   Chronic diastolic heart failure ([place]) 10/03/2017   Ischemic cardiomyopathy 10/03/2017   First degree heart block 10/03/2017   Retained lens material following cataract surgery of left eye 03/20/2017   Diabetic neuropathy with neurologic complication ([place]) 96/78/9381   Gastroesophageal reflux disease 07/12/2016   Chronic fatigue, unspecified 11/03/2015   Hilar mass 11/03/2015   Iron deficiency anemia 11/03/2015   CAFL (chronic airflow limitation) ([place]) 12/17/2013   Chronic obstructive pulmonary disease ([place]) 12/17/2013   DM type 2 with diabetic dyslipidemia ([place]) 09/17/2013   Hypertension associated  with diabetes ([place]) 09/17/2013   Carotid artery disease ([place]) 12/27/2009   DIABETES MELLITUS, TYPE II 04/30/2009   HYPERLIPIDEMIA 04/30/2009   Essential hypertension 04/30/2009   Coronary artery disease of native artery of native heart with stable angina pectoris ([place]) 04/30/2009    Conditions to be addressed/monitored:HTN, DMII, and advanced age  Care Plan : [place]  Updates made by  Ilean China, RN since 08/10/2021 12:00 AM     Problem: Chronic Disease Management Needs   Priority: High  Onset Date: 06/28/2021     Long-Range Goal: Patient/Son will work with RN Care Manager Regarding Care Management and [place] with HTN, DM, COPD, Hypothyroidism, social isolation, and Advanced Age   Start Date: 06/28/2021  Expected End Date: 06/28/2022  This Visit's Progress: On track  Recent Progress: On track  Priority: High  Note:   Current Barriers:  Chronic Disease Management support and education needs related to HTN, COPD, and DMII Advanced age Lack of socialization  Risk for falls  RNCM Clinical Goal(s):  Patient will continue to work with Consulting civil engineer and/or Social Worker to address care management and care coordination needs related to HTN, COPD, DMII, and hypothyroidism, social isolation, advanced age as evidenced by adherence to CM Team Scheduled appointments     work with community resource care guide to address needs related to Social Isolation and difficulty with food preparation as evidenced by patient and/or community resource care guide support    through collaboration with Consulting civil engineer, provider, and care team.   Interventions: 1:1 collaboration with primary care provider regarding development and update of comprehensive plan of care as evidenced by provider attestation and co-signature Inter-disciplinary care team collaboration (see longitudinal plan of care) Evaluation of current treatment plan related to  self management and patient's adherence to plan as established by provider Discussed referral to LCSW. They do not feel this is needed at this time but will engage with LCSW in the future if needed.   Diabetes Interventions:  (Status:  Condition stable.  Not addressed this visit.) Long Term Goal Assessed patient's understanding of A1c goal: <8% Provided education to patient about basic DM disease process Reviewed medications with  patient and discussed importance of medication adherence Counseled on importance of regular laboratory monitoring as prescribed Discussed plans with patient for ongoing care management follow up and provided patient with direct contact information for care management team Advised patient, providing education and rationale, to check cbg daily and record, calling [phone] for findings outside established parameters Review of patient status, including review of consultants reports, relevant laboratory and other test results, and medications completed Assessed social determinant of health barriers Reviewed upcoming appointments.  Assessed transportation needs. Patient drives himself or his son will come with him.   Falls Interventions:  (Status:  Condition stable.  Not addressed this visit.) Long Term Goal Advised patient of importance of notifying provider of falls Assessed for falls since last encounter Assessed social determinant of health barriers Discussed mobility and ability to perform ADLs Discussed and encouraged use of assistive devices: Walker and shower chair Provided verbal education on fall prevention strategies   SDOH Barriers (Status:  Goal on track:  Yes.) Short Term Goal Patient interviewed and SDOH assessment performed        SDOH Interventions    Flowsheet Row Most Recent Value  SDOH Interventions   Social Connections Interventions Refer to [facility]regational Nursing, Other (Comment)  [[facility] Meals with Friends Program]  Discussed palliative care and nursing assessment Discussed recent ED visit for epistaxis Discussed that patient has moved in with his grandson and his family Discussed that they are concerned about his safety and driving and they are not letting him drive at this time Reviewed upcoming appointment with PCP Encouraged to reach out to [place] as needed    Hypothyroidism:  (Status: Condition stable. Not addressed this visit.) Long Term Goal   Reviewed and discussed recent lab reports and elevated TSH Reviewed and discussed recent medication adjustment: levothyroxine 155mg to 1315m. Has not received 13791mfrom mail order yet.  Provided verbal education on importance of taking levothyroxine 30 minutes before other medications and food, especially acid reducers Discussed decreased energy level and fatigue. Hoping TSH correction will help with this.  Reviewed upcoming appt with PCP in March to rck TSH   Patient Goals/Self-Care Activities: Take medications as prescribed   Attend all scheduled provider appointments Perform all self care activities independently  Call provider office for new concerns or questions  check blood sugar at prescribed times: once daily and when you have symptoms of low or high blood sugar enter blood sugar readings and medication or insulin into daily log take the blood sugar meter to all doctor visits Call RN Care Manager as needed [phone] carefully to avoid falls  Plan:Telephone follow up appointment with care management team member scheduled for:  09/09/21 with RNCM The patient has been provided with contact information for the care management team and has been advised to call with any health related questions or concerns.   KriChong SicilianSN, RN-BC Embedded Chronic Care Manager [facility] Direct Dial: [phone]

## 2021-08-11 ENCOUNTER — Other Ambulatory Visit: Payer: Self-pay

## 2021-08-11 ENCOUNTER — Other Ambulatory Visit (HOSPITAL_COMMUNITY): Payer: Self-pay | Admitting: Cardiovascular Disease

## 2021-08-11 ENCOUNTER — Telehealth: Payer: Self-pay

## 2021-08-11 ENCOUNTER — Ambulatory Visit (HOSPITAL_COMMUNITY)
Admission: RE | Admit: 2021-08-11 | Discharge: 2021-08-11 | Disposition: A | Discharge status: Home / Self Care | Payer: Medicare HMO | Source: Ambulatory Visit | Attending: Internal Medicine | Admitting: Internal Medicine

## 2021-08-11 DIAGNOSIS — I6523 Occlusion and stenosis of bilateral carotid arteries: Secondary | ICD-10-CM

## 2021-08-11 NOTE — Telephone Encounter (Signed)
-----   Message from [name], MD sent at 08/11/2021  1:35 PM EST ----- ?Stable findings from prior study. Please repeat in 12 months. Continue medical therapy. thanks ?

## 2021-08-24 ENCOUNTER — Ambulatory Visit (INDEPENDENT_AMBULATORY_CARE_PROVIDER_SITE_OTHER): Payer: Medicare HMO | Admitting: Family Medicine

## 2021-08-24 ENCOUNTER — Encounter: Payer: Self-pay | Admitting: Family Medicine

## 2021-08-24 VITALS — BP 158/93 | HR 71 | Temp 97.5°F | Ht 70.0 in | Wt 148.8 lb

## 2021-08-24 DIAGNOSIS — E039 Hypothyroidism, unspecified: Secondary | ICD-10-CM | POA: Diagnosis not present

## 2021-08-24 DIAGNOSIS — E1169 Type 2 diabetes mellitus with other specified complication: Secondary | ICD-10-CM

## 2021-08-24 DIAGNOSIS — Z13220 Encounter for screening for lipoid disorders: Secondary | ICD-10-CM

## 2021-08-24 DIAGNOSIS — E785 Hyperlipidemia, unspecified: Secondary | ICD-10-CM | POA: Diagnosis not present

## 2021-08-24 DIAGNOSIS — I152 Hypertension secondary to endocrine disorders: Secondary | ICD-10-CM | POA: Diagnosis not present

## 2021-08-24 DIAGNOSIS — E1159 Type 2 diabetes mellitus with other circulatory complications: Secondary | ICD-10-CM | POA: Diagnosis not present

## 2021-08-24 LAB — BAYER DCA HB A1C WAIVED: HB A1C (BAYER DCA - WAIVED): 7 % — ABNORMAL HIGH (ref 4.8–5.6)
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
## 2021-08-25 LAB — CBC WITH DIFFERENTIAL/PLATELET
Basophils Absolute: 0 10*3/uL (ref 0.0–0.2)
Basos: 1 %
EOS (ABSOLUTE): 0.1 10*3/uL (ref 0.0–0.4)
Eos: 1 %
Hematocrit: 43.6 % (ref 37.5–51.0)
Hemoglobin: 14.7 g/dL (ref 13.0–17.7)
Immature Grans (Abs): 0 10*3/uL (ref 0.0–0.1)
Immature Granulocytes: 0 %
Lymphocytes Absolute: 0.8 10*3/uL (ref 0.7–3.1)
Lymphs: 13 %
MCH: 31.9 pg (ref 26.6–33.0)
MCHC: 33.7 g/dL (ref 31.5–35.7)
MCV: 95 fL (ref 79–97)
Monocytes Absolute: 0.6 10*3/uL (ref 0.1–0.9)
Monocytes: 10 %
Neutrophils Absolute: 4.4 10*3/uL (ref 1.4–7.0)
Neutrophils: 75 %
Platelets: 207 10*3/uL (ref 150–450)
RBC: 4.61 x10E6/uL (ref 4.14–5.80)
RDW: 12.1 % (ref 11.6–15.4)
WBC: 5.8 10*3/uL (ref 3.4–10.8)

## 2021-08-25 LAB — CMP14+EGFR
ALT: 14 IU/L (ref 0–44)
AST: 23 IU/L (ref 0–40)
Albumin/Globulin Ratio: 1.9 (ref 1.2–2.2)
Albumin: 4.3 g/dL (ref 3.5–4.6)
Alkaline Phosphatase: 71 IU/L (ref 44–121)
BUN/Creatinine Ratio: 22 (ref 10–24)
BUN: 24 mg/dL (ref 10–36)
Bilirubin Total: 0.6 mg/dL (ref 0.0–1.2)
CO2: 26 mmol/L (ref 20–29)
Calcium: 9.9 mg/dL (ref 8.6–10.2)
Chloride: 101 mmol/L (ref 96–106)
Creatinine, Ser: 1.11 mg/dL (ref 0.76–1.27)
Globulin, Total: 2.3 g/dL (ref 1.5–4.5)
Glucose: 252 mg/dL — ABNORMAL HIGH (ref 70–99)
Potassium: 5 mmol/L (ref 3.5–5.2)
Sodium: 140 mmol/L (ref 134–144)
Total Protein: 6.6 g/dL (ref 6.0–8.5)
eGFR: 61 mL/min/{1.73_m2} (ref >59)

## 2021-08-25 LAB — LIPID PANEL
Chol/HDL Ratio: 3.7 ratio (ref 0.0–5.0)
Cholesterol, Total: 183 mg/dL (ref 100–199)
HDL: 49 mg/dL (ref >39)
LDL Chol Calc (NIH): 115 mg/dL — ABNORMAL HIGH (ref 0–99)
Triglycerides: 106 mg/dL (ref 0–149)
VLDL Cholesterol Cal: 19 mg/dL (ref 5–40)

## 2021-08-25 LAB — TSH+FREE T4
Free T4: 1.78 ng/dL — ABNORMAL HIGH (ref 0.82–1.77)
TSH: 2.13 u[IU]/mL (ref 0.450–4.500)

## 2021-08-29 NOTE — Progress Notes (Signed)
Hello Sean Hartman,  Your lab result is normal and/or stable.Some minor variations that are not significant are commonly marked abnormal, but do not represent any medical problem for you.  Best regards, Armando Lauman, M.D.

## 2021-09-02 DIAGNOSIS — E039 Hypothyroidism, unspecified: Secondary | ICD-10-CM | POA: Diagnosis not present

## 2021-09-02 DIAGNOSIS — E1169 Type 2 diabetes mellitus with other specified complication: Secondary | ICD-10-CM

## 2021-09-07 ENCOUNTER — Ambulatory Visit (INDEPENDENT_AMBULATORY_CARE_PROVIDER_SITE_OTHER): Payer: Medicare HMO | Admitting: Family Medicine

## 2021-09-07 ENCOUNTER — Encounter: Payer: Self-pay | Admitting: Family Medicine

## 2021-09-07 ENCOUNTER — Ambulatory Visit (INDEPENDENT_AMBULATORY_CARE_PROVIDER_SITE_OTHER): Payer: Medicare HMO

## 2021-09-07 VITALS — BP 135/83 | HR 70 | Temp 97.1°F | Ht 70.0 in | Wt 150.6 lb

## 2021-09-07 DIAGNOSIS — E1140 Type 2 diabetes mellitus with diabetic neuropathy, unspecified: Secondary | ICD-10-CM

## 2021-09-07 DIAGNOSIS — E1169 Type 2 diabetes mellitus with other specified complication: Secondary | ICD-10-CM | POA: Diagnosis not present

## 2021-09-07 DIAGNOSIS — M25511 Pain in right shoulder: Secondary | ICD-10-CM

## 2021-09-07 DIAGNOSIS — E785 Hyperlipidemia, unspecified: Secondary | ICD-10-CM

## 2021-09-07 DIAGNOSIS — E1149 Type 2 diabetes mellitus with other diabetic neurological complication: Secondary | ICD-10-CM

## 2021-09-07 MED ORDER — DONEPEZIL HCL 5 MG PO TABS: 5.0000 mg | ORAL_TABLET | Freq: Every day | ORAL | 1 refills | Status: DC
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
## 2021-09-09 ENCOUNTER — Telehealth: Payer: Medicare HMO

## 2021-09-13 ENCOUNTER — Telehealth: Payer: Self-pay | Admitting: Family Medicine

## 2021-09-13 ENCOUNTER — Other Ambulatory Visit: Payer: Self-pay | Admitting: Family Medicine

## 2021-09-13 DIAGNOSIS — H9113 Presbycusis, bilateral: Secondary | ICD-10-CM

## 2021-09-13 NOTE — Telephone Encounter (Signed)
Melisa from [facility] called to check on referral for patient to an ENT for a hearing check. ?Patient has not heard anything.   ?Do we have a number they can call for the ENT? ?Please call. ?

## 2021-09-13 NOTE — Telephone Encounter (Signed)
My fault. We talked about it, but I didn't realize he was ready to proceed. I submitted the referral just now. ?

## 2021-09-14 NOTE — Telephone Encounter (Signed)
Called and the receptionist kept transferring and could not get an answer  ?

## 2021-09-15 DIAGNOSIS — I5032 Chronic diastolic (congestive) heart failure: Secondary | ICD-10-CM | POA: Diagnosis not present

## 2021-09-15 DIAGNOSIS — Z515 Encounter for palliative care: Secondary | ICD-10-CM | POA: Diagnosis not present

## 2021-09-15 NOTE — Telephone Encounter (Signed)
Allsion from Dr Benjamine Mola calling--pt efused to schedule referral apt. He said that he did not want to go out to [place]. [phone] ?

## 2021-09-19 ENCOUNTER — Other Ambulatory Visit: Payer: Self-pay | Admitting: Family Medicine

## 2021-09-19 DIAGNOSIS — E1140 Type 2 diabetes mellitus with diabetic neuropathy, unspecified: Secondary | ICD-10-CM

## 2021-09-19 DIAGNOSIS — I10 Essential (primary) hypertension: Secondary | ICD-10-CM

## 2021-10-11 ENCOUNTER — Encounter: Payer: Self-pay | Admitting: Family Medicine

## 2021-10-11 ENCOUNTER — Ambulatory Visit (INDEPENDENT_AMBULATORY_CARE_PROVIDER_SITE_OTHER): Payer: Medicare HMO | Admitting: Family Medicine

## 2021-10-11 VITALS — BP 122/66 | HR 89 | Temp 97.1°F | Ht 70.0 in | Wt 146.8 lb

## 2021-10-11 DIAGNOSIS — G301 Alzheimer's disease with late onset: Secondary | ICD-10-CM | POA: Insufficient documentation

## 2021-10-11 DIAGNOSIS — F02B3 Dementia in other diseases classified elsewhere, moderate, with mood disturbance: Secondary | ICD-10-CM | POA: Diagnosis not present

## 2021-10-11 MED ORDER — FAMOTIDINE 20 MG PO TABS: 20.0000 mg | ORAL_TABLET | Freq: Every day | ORAL | 3 refills | Status: DC

## 2021-10-11 MED ORDER — DONEPEZIL HCL 10 MG PO TABS: 10.0000 mg | ORAL_TABLET | Freq: Every day | ORAL | 2 refills | Status: DC

## 2021-10-11 NOTE — Progress Notes (Signed)
? ?Subjective:  ?Patient ID: ZIQUAN FIDEL, male    DOB: 1922-12-25  Age: 88 y.o. MRN: [id number] ? ?CC: Medical Management of Chronic Issues ? ? ?HPI ?Sean Hartman presents for rcheck of alzheimers dx. he started taking donepezil a month ago.  He does have a little bit of morning sluggishness for couple of hours.  Other than that he is tolerating it quite well.  He seems to be remembering pretty well we did not do formal MMSE today.  Since he is tolerating the medicine the plan was to increase the dose if possible.  He also mentions that his shoulder still hurting.  He has good range of motion.  He is using Tylenol and Aspercreme.  That does sometimes wear off during the night.  X-ray reviewed did not show any acute injury such as fracture or dislocation when he was here last time. ? ? ?  10/11/2021  ?  9:30 AM 09/07/2021  ? 12:59 PM 08/24/2021  ? 10:06 AM  ?Depression screen PHQ 2/9  ?Decreased Interest 0 2 2  ?Down, Depressed, Hopeless 0 1 1  ?PHQ - 2 Score 0 3 3  ?Altered sleeping  0 0  ?Tired, decreased energy  1 1  ?Change in appetite  0 0  ?Feeling bad or failure about yourself   0 0  ?Trouble concentrating  0 0  ?Moving slowly or fidgety/restless  0 0  ?Suicidal thoughts  0 0  ?PHQ-9 Score  4 4  ?Difficult doing work/chores  Not difficult at all Somewhat difficult  ? ? ?History ?Sean Hartman has a past medical history of Anemia, CAD (coronary artery disease), Carotid stenosis, Colon cancer ([place]), DM2 (diabetes mellitus, type 2) ([place]), Essential hypertension, HLD (hyperlipidemia), Ischemic cardiomyopathy, and Status post partial colectomy (11/19/2015).  ? ?He has a past surgical history that includes Cataract extraction w/ intraocular lens  implant, bilateral; Colectomy; Coronary angioplasty with stent; Inguinal hernia repair (Left); 25 gauge pars plana vitrectomy with 20 gauge mvr port (Left, 03/20/2017); and 25 gauge pars plana vitrectomy with 20 gauge mvr port (Left, 03/20/2017).  ? ?His family history  includes Hypertension in his father.He reports that he quit smoking about 23 years ago. His smoking use included cigarettes. He has never used smokeless tobacco. He reports that he does not drink alcohol and does not use drugs. ? ? ? ?ROS ?Review of Systems ?Noncontributory except as noted in HPI. ?Objective:  ?BP 122/66   Pulse 89   Temp (!) 97.1 ?F (36.2 ?C)   Ht '5\' 10"'$  (1.778 m)   Wt 146 lb 12.8 oz (66.6 kg)   SpO2 96%   BMI 21.06 kg/m?  ? ?BP Readings from Last 3 Encounters:  ?10/11/21 122/66  ?09/07/21 135/83  ?08/24/21 (!) 158/93  ? ? ?Wt Readings from Last 3 Encounters:  ?10/11/21 146 lb 12.8 oz (66.6 kg)  ?09/07/21 150 lb 9.6 oz (68.3 kg)  ?08/24/21 148 lb 12.8 oz (67.5 kg)  ? ? ? ?Physical Exam ?Vitals reviewed.  ?Constitutional:   ?   Appearance: He is well-developed.  ?HENT:  ?   Head: Normocephalic and atraumatic.  ?   Right Ear: External ear normal.  ?   Left Ear: External ear normal.  ?   Mouth/Throat:  ?   Pharynx: No oropharyngeal exudate or posterior oropharyngeal erythema.  ?Eyes:  ?   Pupils: Pupils are equal, round, and reactive to light.  ?Cardiovascular:  ?   Rate and Rhythm: Normal rate and regular rhythm.  ?  Heart sounds: No murmur heard. ?Pulmonary:  ?   Effort: No respiratory distress.  ?   Breath sounds: Normal breath sounds.  ?Musculoskeletal:     ?   General: Normal range of motion.  ?   Cervical back: Normal range of motion and neck supple.  ?Neurological:  ?   Mental Status: He is alert and oriented to person, place, and time.  ? ? ? ? ?Assessment & Plan:  ? ?Sean Hartman was seen today for medical management of chronic issues. ? ?Diagnoses and all orders for this visit: ? ?Moderate late onset Alzheimer's dementia with mood disturbance ([place]) ? ?Other orders ?-     famotidine (PEPCID) 20 MG tablet; Take 1 tablet (20 mg total) by mouth daily. ?-     donepezil (ARICEPT) 10 MG tablet; Take 1 tablet (10 mg total) by mouth at bedtime. ? ? ? ? ? ? ?I have changed Sean Hartman's  famotidine and donepezil. I am also having him maintain his aspirin, Spiriva HandiHaler, Accu-Chek Guide, nitroGLYCERIN, sitaGLIPtin, carvedilol, furosemide, linaclotide, levothyroxine, losartan, and amLODipine. ? ?Allergies as of 10/11/2021   ?No Known Allergies ?  ? ?  ?Medication List  ?  ? ?  ? Accurate as of Oct 11, 2021 10:03 AM. If you have any questions, ask your nurse or doctor.  ?  ?  ? ?  ? ?Accu-Chek Guide test strip ?Generic drug: glucose blood ?TEST BLOOD SUGAR TWICE DAILY AS DIRECTED Dx E11.9 ?  ?amLODipine 2.5 MG tablet ?Commonly known as: NORVASC ?TAKE 1 TABLET EVERY DAY ?  ?aspirin 81 MG EC tablet ?Take 81 mg by mouth daily. ?  ?carvedilol 6.25 MG tablet ?Commonly known as: COREG ?Take 1 tablet (6.25 mg total) by mouth 2 (two) times daily with a meal. ?  ?donepezil 10 MG tablet ?Commonly known as: Aricept ?Take 1 tablet (10 mg total) by mouth at bedtime. ?What changed:  ?medication strength ?how much to take ?Changed by: Claretta Fraise, MD ?  ?famotidine 20 MG tablet ?Commonly known as: PEPCID ?Take 1 tablet (20 mg total) by mouth daily. ?What changed: how much to take ?Changed by: Claretta Fraise, MD ?  ?furosemide 40 MG tablet ?Commonly known as: LASIX ?TAKE 1 TABLET EVERY DAY AS NEEDED  (INCREASED  WEIGHT  GAIN OR SWELLING) ?  ?levothyroxine 137 MCG tablet ?Commonly known as: SYNTHROID ?Take 1 tablet (137 mcg total) by mouth daily. Replaces 112 mg dose. Please cancel it. ?  ?linaclotide 72 MCG capsule ?Commonly known as: Linzess ?Take 1 capsule (72 mcg total) by mouth daily before breakfast. To keep bowels soft and regular ?  ?losartan 50 MG tablet ?Commonly known as: COZAAR ?Take 1 tablet (50 mg total) by mouth daily. ?  ?nitroGLYCERIN 0.4 MG SL tablet ?Commonly known as: NITROSTAT ?DISSOLVE 1 TABLET UNDER TONGUE EVERY 5 MINUTES AS NEEDED FOR CHEST PAIN UP TO 3 DOSES ?  ?sitaGLIPtin 100 MG tablet ?Commonly known as: Januvia ?Take 1 tablet (100 mg total) by mouth daily. ?  ?Spiriva HandiHaler 18 MCG  inhalation capsule ?Generic drug: tiotropium ?Place 1 capsule (18 mcg total) into inhaler and inhale daily as needed (shortness of breath). ?  ? ?  ? ? ? ?Follow-up: Return in about 6 weeks (around 11/22/2021). ? ?Claretta Fraise, M.D. ?

## 2021-10-17 ENCOUNTER — Encounter: Payer: Self-pay | Admitting: *Deleted

## 2021-10-17 ENCOUNTER — Ambulatory Visit (INDEPENDENT_AMBULATORY_CARE_PROVIDER_SITE_OTHER): Payer: Medicare HMO | Admitting: *Deleted

## 2021-10-17 DIAGNOSIS — I152 Hypertension secondary to endocrine disorders: Secondary | ICD-10-CM

## 2021-10-17 DIAGNOSIS — F02B3 Dementia in other diseases classified elsewhere, moderate, with mood disturbance: Secondary | ICD-10-CM

## 2021-10-17 DIAGNOSIS — E1159 Type 2 diabetes mellitus with other circulatory complications: Secondary | ICD-10-CM

## 2021-10-17 DIAGNOSIS — G301 Alzheimer's disease with late onset: Secondary | ICD-10-CM

## 2021-10-17 NOTE — Chronic Care Management (AMB) (Signed)
?Chronic Care Management  ? ?CCM RN Visit Note ? ?10/17/2021 ?Name: Sean Hartman MRN: [id number] DOB: June 13, 1922 ? ?Subjective: ?Sean Hartman is a 88 y.o. year old male who is a primary care patient of Stacks, Cletus Gash, MD. The care management team was consulted for assistance with disease management and care coordination needs.   ? ?Engaged with patient's son by telephone  for follow up visit in response to provider referral for case management and/or care coordination services.  ? ?Consent to Services:  ?The patient was given information about Chronic Care Management services, agreed to services, and gave verbal consent prior to initiation of services.  Please see initial visit note for detailed documentation.  ? ?Patient agreed to services and verbal consent obtained.  ? ?Assessment: Review of patient past medical history, allergies, medications, health status, including review of consultants reports, laboratory and other test data, was performed as part of comprehensive evaluation and provision of chronic care management services.  ? ?SDOH (Social Determinants of Health) assessments and interventions performed:   ? ?CCM Care Plan ? ?No Known Allergies ? ?Outpatient Encounter Medications as of 10/17/2021  ?Medication Sig Note  ? amLODipine (NORVASC) 2.5 MG tablet TAKE 1 TABLET EVERY DAY   ? aspirin 81 MG EC tablet Take 81 mg by mouth daily.   ? carvedilol (COREG) 6.25 MG tablet Take 1 tablet (6.25 mg total) by mouth 2 (two) times daily with a meal.   ? donepezil (ARICEPT) 10 MG tablet Take 1 tablet (10 mg total) by mouth at bedtime.   ? famotidine (PEPCID) 20 MG tablet Take 1 tablet (20 mg total) by mouth daily.   ? furosemide (LASIX) 40 MG tablet TAKE 1 TABLET EVERY DAY AS NEEDED  (INCREASED  WEIGHT  GAIN OR SWELLING)   ? glucose blood (ACCU-CHEK GUIDE) test strip TEST BLOOD SUGAR TWICE DAILY AS DIRECTED Dx E11.9   ? levothyroxine (SYNTHROID) 137 MCG tablet Take 1 tablet (137 mcg total) by mouth daily.  Replaces 112 mg dose. Please cancel it.   ? linaclotide (LINZESS) 72 MCG capsule Take 1 capsule (72 mcg total) by mouth daily before breakfast. To keep bowels soft and regular   ? losartan (COZAAR) 50 MG tablet Take 1 tablet (50 mg total) by mouth daily.   ? nitroGLYCERIN (NITROSTAT) 0.4 MG SL tablet DISSOLVE 1 TABLET UNDER TONGUE EVERY 5 MINUTES AS NEEDED FOR CHEST PAIN UP TO 3 DOSES   ? sitaGLIPtin (JANUVIA) 100 MG tablet Take 1 tablet (100 mg total) by mouth daily. 06/28/2021: Taking 1/2 tablet if blood sugar is elevated and does not take if blood sugar is within normal range for him  ? tiotropium (SPIRIVA HANDIHALER) 18 MCG inhalation capsule Place 1 capsule (18 mcg total) into inhaler and inhale daily as needed (shortness of breath).   ? ?No facility-administered encounter medications on file as of 10/17/2021.  ? ? ?Patient Active Problem List  ? Diagnosis Date Noted  ? Moderate late onset Alzheimer's dementia with mood disturbance ([place]) 10/11/2021  ? Hypothyroidism 10/10/2017  ? Chronic diastolic heart failure ([place]) 10/03/2017  ? Ischemic cardiomyopathy 10/03/2017  ? First degree heart block 10/03/2017  ? Retained lens material following cataract surgery of left eye 03/20/2017  ? Diabetic neuropathy with neurologic complication ([place]) 65/08/5463  ? Gastroesophageal reflux disease 07/12/2016  ? Chronic fatigue, unspecified 11/03/2015  ? Hilar mass 11/03/2015  ? Iron deficiency anemia 11/03/2015  ? CAFL (chronic airflow limitation) ([place]) 12/17/2013  ? Chronic obstructive pulmonary disease ([place]) 12/17/2013  ?  DM type 2 with diabetic dyslipidemia ([place]) 09/17/2013  ? Hypertension associated with diabetes ([place]) 09/17/2013  ? Carotid artery disease ([place]) 12/27/2009  ? DIABETES MELLITUS, TYPE II 04/30/2009  ? HYPERLIPIDEMIA 04/30/2009  ? Essential hypertension 04/30/2009  ? Coronary artery disease of native artery of native heart with stable angina pectoris ([place]) 04/30/2009  ? ? ?Conditions to be addressed/monitored:HTN,  COPD, and Dementia ? ?Care Plan : [facility] Care Plan  ?Updates made by Ilean China, RN since 10/17/2021 12:00 AM  ?  ? ?Problem: Chronic Disease Management Needs   ?Priority: High  ?Onset Date: 06/28/2021  ?  ? ?Long-Range Goal: Patient/Son will work with Consulting civil engineer Regarding Care Management and [place] with HTN, DM, COPD, Hypothyroidism, social isolation, Moderate Late Onset Alzheimers with Behavioral Disturbance and Advanced Age   ?Start Date: 06/28/2021  ?Expected End Date: 06/28/2022  ?This Visit's Progress: On track  ?Recent Progress: On track  ?Priority: High  ?Note:   ?Current Barriers:  ?Chronic Disease Management support and education needs related to HTN, COPD, and DMII ?Advanced age ?Lack of socialization  ?Risk for falls ? ?RNCM Clinical Goal(s):  ?Patient will continue to work with RN Care Manager and/or Social Worker to address care management and care coordination needs related to HTN, COPD, DMII, Dementia, and hypothyroidism, social isolation, advanced age as evidenced by adherence to CM Team Scheduled appointments     through collaboration with RN Care manager, provider, and care team.  ? ?Interventions: ?1:1 collaboration with primary care provider regarding development and update of comprehensive plan of care as evidenced by provider attestation and co-signature ?Inter-disciplinary care team collaboration (see longitudinal plan of care) ?Evaluation of current treatment plan related to  self management and patient's adherence to plan as established by provider ?Talked with son, [name]", who is listed on DPR regarding current management of care ?Patient has moved back to his apartment where he lives alone. His son, [name], and two grandson's alternate visiting with him and he calls them as needed ?Grandson and his wife were providing patient with meals, cleaning his clothes, providing groceries and other necessities, and driving him wherever he needed to go. Patient was  unhappy with not being able to drive on his own and wanted to move back to his own home to live independently.  ?Patient has been much more agitated and confused over the past months and didn't understand that it wasn't safe to drive in an unfamiliar area. He also needed help with his medications because he would try to take them twice sometimes because he didn't remember taking them earlier. APS visited patient at PCP's request while he was living with his grandson and did not find anything to report.  ?Discussed diagnosis of moderate late onset alzheimer's with behavioral disturbance and likelihood of sundowning in the evening ?Family has discussed assisted living with patient but he refuses. Per son, APS determined that he can make his own decisions.  ?Therapeutic listening utilized regarding caregiver stress and disrupted family dynamics  ?Encouraged to keep all follow-up appointments ?Encouraged to reach out to [facility] as needed ?Encouraged to seek medical attention if needed ? ?Diabetes Interventions:  (Status:  Condition stable.  Not addressed this visit.) Long Term Goal ?Assessed patient's understanding of A1c goal: <8% ?Provided education to patient about basic DM disease process ?Reviewed medications with patient and discussed importance of medication adherence ?Counseled on importance of regular laboratory monitoring as prescribed ?Discussed plans with patient for ongoing care management follow up  and provided patient with direct contact information for care management team ?Advised patient, providing education and rationale, to check cbg daily and record, calling [phone] for findings outside established parameters ?Review of patient status, including review of consultants reports, relevant laboratory and other test results, and medications completed ?Assessed social determinant of health barriers ?Reviewed upcoming appointments.  ?Assessed transportation needs. Patient drives himself or his son will  come with him. ? ? ?Falls Interventions:  (Status:  Goal on track:  NO.) Long Term Goal ?Advised patient of importance of notifying provider of falls ?Assessed for falls since last encounter ?Assessed social

## 2021-10-17 NOTE — Patient Instructions (Signed)
Visit Information ? ? ?Patient Goals/Self-Care Activities: ?Take medications as prescribed   ?Attend all scheduled provider appointments ?Perform all self care activities independently  ?Call provider office for new concerns or questions  ?check blood sugar at prescribed times: once daily and when you have symptoms of low or high blood sugar ?enter blood sugar readings and medication or insulin into daily log ?take the blood sugar meter to all doctor visits ?Call RN Care Manager as needed [phone] ?Move carefully to avoid falls ? ? ?Patient verbalizes understanding of instructions and care plan provided today and agrees to view in [place]. Active MyChart status confirmed with patient.   ? ?Plan:Telephone follow up appointment with care management team member scheduled for:  10/26/21 with RNCM ?The patient has been provided with contact information for the care management team and has been advised to call with any health related questions or concerns.  ? ?Chong Sicilian, BSN, RN-BC ?Embedded Chronic Care Manager ?[place] [facility] [place] [facility] ?Direct Dial: [phone] ?  ?

## 2021-10-21 ENCOUNTER — Other Ambulatory Visit: Payer: Self-pay | Admitting: Family Medicine

## 2021-10-21 DIAGNOSIS — E1169 Type 2 diabetes mellitus with other specified complication: Secondary | ICD-10-CM

## 2021-10-21 DIAGNOSIS — E1140 Type 2 diabetes mellitus with diabetic neuropathy, unspecified: Secondary | ICD-10-CM

## 2021-10-25 ENCOUNTER — Encounter: Payer: Self-pay | Admitting: *Deleted

## 2021-10-26 ENCOUNTER — Telehealth: Payer: Self-pay | Admitting: *Deleted

## 2021-10-26 ENCOUNTER — Telehealth: Payer: Medicare HMO | Admitting: *Deleted

## 2021-11-02 DIAGNOSIS — E1169 Type 2 diabetes mellitus with other specified complication: Secondary | ICD-10-CM | POA: Diagnosis not present

## 2021-11-02 DIAGNOSIS — Z7984 Long term (current) use of oral hypoglycemic drugs: Secondary | ICD-10-CM | POA: Diagnosis not present

## 2021-11-02 DIAGNOSIS — E039 Hypothyroidism, unspecified: Secondary | ICD-10-CM

## 2021-11-13 ENCOUNTER — Other Ambulatory Visit: Payer: Self-pay | Admitting: Family Medicine

## 2021-11-13 DIAGNOSIS — E039 Hypothyroidism, unspecified: Secondary | ICD-10-CM

## 2021-11-14 ENCOUNTER — Encounter: Payer: Self-pay | Admitting: Family Medicine

## 2021-11-14 ENCOUNTER — Ambulatory Visit (INDEPENDENT_AMBULATORY_CARE_PROVIDER_SITE_OTHER): Payer: Medicare HMO | Admitting: Family Medicine

## 2021-11-14 VITALS — BP 137/67 | HR 88 | Temp 97.8°F | Ht 70.0 in | Wt 153.2 lb

## 2021-11-14 DIAGNOSIS — G301 Alzheimer's disease with late onset: Secondary | ICD-10-CM | POA: Diagnosis not present

## 2021-11-14 DIAGNOSIS — F02B3 Dementia in other diseases classified elsewhere, moderate, with mood disturbance: Secondary | ICD-10-CM

## 2021-11-14 MED ORDER — MEMANTINE HCL 5 MG PO TABS: 5.0000 mg | ORAL_TABLET | Freq: Two times a day (BID) | ORAL | 1 refills | Status: DC

## 2021-11-14 MED ORDER — FUROSEMIDE 40 MG PO TABS: ORAL_TABLET | ORAL | 3 refills | Status: DC

## 2021-11-14 NOTE — Progress Notes (Signed)
Subjective:  Patient ID: Sean Hartman, male    DOB: 1922-11-25  Age: 88 y.o. MRN: [id number]  CC: Medical Management of Chronic Issues   HPI Sean Hartman presents for follow-up of his Alzheimer's dementia.  He drove himself here today.  He tells me that his son would not bring him.  He is also concerned that his son is stealing money from him.  Apparently it is in a safe that his son has access to.  He says he worked extra to get that 44 before he retired.  They put it in a safe together.  Now his son will not let him have access to it.       11/14/2021   10:19 AM 10/11/2021    9:30 AM 09/07/2021   12:59 PM  Depression screen PHQ 2/9  Decreased Interest 0 0 2  Down, Depressed, Hopeless 0 0 1  PHQ - 2 Score 0 0 3  Altered sleeping   0  Tired, decreased energy   1  Change in appetite   0  Feeling bad or failure about yourself    0  Trouble concentrating   0  Moving slowly or fidgety/restless   0  Suicidal thoughts   0  PHQ-9 Score   4  Difficult doing work/chores   Not difficult at all    History Sean Hartman has a past medical history of Anemia, CAD (coronary artery disease), Carotid stenosis, Colon cancer ([place]), DM2 (diabetes mellitus, type 2) ([place]), Essential hypertension, HLD (hyperlipidemia), Ischemic cardiomyopathy, and Status post partial colectomy (11/19/2015).   He has a past surgical history that includes Cataract extraction w/ intraocular lens  implant, bilateral; Colectomy; Coronary angioplasty with stent; Inguinal hernia repair (Left); 25 gauge pars plana vitrectomy with 20 gauge mvr port (Left, 03/20/2017); and 25 gauge pars plana vitrectomy with 20 gauge mvr port (Left, 03/20/2017).   His family history includes Hypertension in his father.He reports that he quit smoking about 23 years ago. His smoking use included cigarettes. He has never used smokeless tobacco. He reports that he does not drink alcohol and does not use drugs.    ROS Review of Systems   Constitutional:  Negative for fever.  HENT:  Positive for ear pain (feels full on left).   Respiratory:  Negative for shortness of breath.   Cardiovascular:  Negative for chest pain.  Musculoskeletal:  Negative for arthralgias.  Skin:  Negative for rash.    Objective:  BP 137/67   Pulse 88   Temp 97.8 F (36.6 C)   Ht '5\' 10"'$  (1.778 m)   Wt 153 lb 3.2 oz (69.5 kg)   SpO2 95%   BMI 21.98 kg/m   BP Readings from Last 3 Encounters:  11/14/21 137/67  10/11/21 122/66  09/07/21 135/83    Wt Readings from Last 3 Encounters:  11/14/21 153 lb 3.2 oz (69.5 kg)  10/11/21 146 lb 12.8 oz (66.6 kg)  09/07/21 150 lb 9.6 oz (68.3 kg)     Physical Exam Vitals reviewed.  Constitutional:      Appearance: He is well-developed.  HENT:     Head: Normocephalic and atraumatic.     Right Ear: External ear normal.     Left Ear: External ear normal.     Ears:     Comments: Ear canals occluded with cerumen    Mouth/Throat:     Pharynx: No oropharyngeal exudate or posterior oropharyngeal erythema.  Eyes:     Pupils: Pupils are equal,  round, and reactive to light.  Cardiovascular:     Rate and Rhythm: Normal rate and regular rhythm.     Heart sounds: No murmur heard. Pulmonary:     Effort: No respiratory distress.     Breath sounds: Normal breath sounds.  Musculoskeletal:     Cervical back: Normal range of motion and neck supple.  Neurological:     Mental Status: He is alert and oriented to person, place, and time.       Assessment & Plan:   Sean Hartman was seen today for medical management of chronic issues.  Diagnoses and all orders for this visit:  Moderate late onset Alzheimer's dementia with mood disturbance ([place])  Other orders -     memantine (NAMENDA) 5 MG tablet; Take 1 tablet (5 mg total) by mouth 2 (two) times daily. -     furosemide (LASIX) 40 MG tablet; TAKE 1 TABLET EVERY DAY AS NEEDED  (INCREASED  WEIGHT  GAIN OR SWELLING)       I am having Sean Hartman  start on memantine. I am also having him maintain his aspirin EC, Spiriva HandiHaler, Accu-Chek Guide, nitroGLYCERIN, carvedilol, linaclotide, levothyroxine, losartan, amLODipine, famotidine, donepezil, Januvia, and furosemide.  Allergies as of 11/14/2021   No Known Allergies      Medication List        Accurate as of November 14, 2021  2:48 PM. If you have any questions, ask your nurse or doctor.          Accu-Chek Guide test strip Generic drug: glucose blood TEST BLOOD SUGAR TWICE DAILY AS DIRECTED Dx E11.9   amLODipine 2.5 MG tablet Commonly known as: NORVASC TAKE 1 TABLET EVERY DAY   aspirin EC 81 MG tablet Take 81 mg by mouth daily.   carvedilol 6.25 MG tablet Commonly known as: COREG Take 1 tablet (6.25 mg total) by mouth 2 (two) times daily with a meal.   donepezil 10 MG tablet Commonly known as: Aricept Take 1 tablet (10 mg total) by mouth at bedtime.   famotidine 20 MG tablet Commonly known as: PEPCID Take 1 tablet (20 mg total) by mouth daily.   furosemide 40 MG tablet Commonly known as: LASIX TAKE 1 TABLET EVERY DAY AS NEEDED  (INCREASED  WEIGHT  GAIN OR SWELLING)   Januvia 100 MG tablet Generic drug: sitaGLIPtin TAKE 1 TABLET EVERY DAY   levothyroxine 137 MCG tablet Commonly known as: SYNTHROID Take 1 tablet (137 mcg total) by mouth daily. Replaces 112 mg dose. Please cancel it.   linaclotide 72 MCG capsule Commonly known as: Linzess Take 1 capsule (72 mcg total) by mouth daily before breakfast. To keep bowels soft and regular   losartan 50 MG tablet Commonly known as: COZAAR Take 1 tablet (50 mg total) by mouth daily.   memantine 5 MG tablet Commonly known as: NAMENDA Take 1 tablet (5 mg total) by mouth 2 (two) times daily. Started by: Claretta Fraise, MD   nitroGLYCERIN 0.4 MG SL tablet Commonly known as: NITROSTAT DISSOLVE 1 TABLET UNDER TONGUE EVERY 5 MINUTES AS NEEDED FOR CHEST PAIN UP TO 3 DOSES   Spiriva HandiHaler 18 MCG inhalation  capsule Generic drug: tiotropium Place 1 capsule (18 mcg total) into inhaler and inhale daily as needed (shortness of breath).         Follow-up: Return in about 3 months (around 02/14/2022).  Claretta Fraise, M.D.

## 2021-12-05 NOTE — Telephone Encounter (Signed)
Erroneous encounter. Please disregard.

## 2021-12-29 NOTE — Telephone Encounter (Signed)
  Care Management   Follow Up Note   10/26/2021 Name: Sean Hartman MRN: [id number] DOB: 05/11/1923   Referred by: Claretta Fraise, MD Reason for referral : Chronic Care Management (Unsuccessful telephone follow-up)   An unsuccessful telephone outreach was attempted today. The patient was referred to the case management team for assistance with care management and care coordination.   Follow Up Plan: Telephone follow up appointment with care management team member scheduled for:  Chong Sicilian, BSN, RN-BC [place] [facility] [place] [facility] Direct Dial: [phone]

## 2021-12-29 NOTE — Telephone Encounter (Signed)
Erroneous encounter. Please disregard.

## 2022-01-03 ENCOUNTER — Ambulatory Visit: Payer: Medicare HMO | Admitting: *Deleted

## 2022-01-03 DIAGNOSIS — F02B3 Dementia in other diseases classified elsewhere, moderate, with mood disturbance: Secondary | ICD-10-CM

## 2022-01-03 NOTE — Chronic Care Management (AMB) (Signed)
Care Management    RN Visit Note  01/03/2022 Name: Sean Hartman MRN: [id number] DOB: 1922-10-20  Subjective: Sean Hartman is a 88 y.o. year old male who is a primary care patient of Stacks, Cletus Gash, MD. The care management team was consulted for assistance with disease management and care coordination needs.    Engaged with patient's son, Sean Hartman,  for follow up visit.   Due to changes in the Chronic Care Management program, I am removing myself as the Eagle from the Care Team and closing RN Care Management Care Plans. Patient was not scheduled to be followed by the RN Care Coordination nurse for [facility]. Palliative Care through [facility] is involved.  Patient does not have an open Care Plan with another CCM team member. Patient does not have a current CCM referral placed since 10/03/21. CCM enrollment status changed to "not enrolled".    Assessment: Review of patient past medical history, allergies, medications, health status, including review of consultants reports, laboratory and other test data, was performed as part of comprehensive evaluation and provision of chronic care management services.   SDOH (Social Determinants of Health) assessments and interventions performed:    Care Plan  No Known Allergies  Outpatient Encounter Medications as of 01/03/2022  Medication Sig   amLODipine (NORVASC) 2.5 MG tablet TAKE 1 TABLET EVERY DAY   aspirin 81 MG EC tablet Take 81 mg by mouth daily.   carvedilol (COREG) 6.25 MG tablet Take 1 tablet (6.25 mg total) by mouth 2 (two) times daily with a meal.   donepezil (ARICEPT) 10 MG tablet Take 1 tablet (10 mg total) by mouth at bedtime.   famotidine (PEPCID) 20 MG tablet Take 1 tablet (20 mg total) by mouth daily.   furosemide (LASIX) 40 MG tablet TAKE 1 TABLET EVERY DAY AS NEEDED  (INCREASED  WEIGHT  GAIN OR SWELLING)   glucose blood (ACCU-CHEK GUIDE) test strip TEST BLOOD SUGAR TWICE DAILY AS DIRECTED Dx E11.9    JANUVIA 100 MG tablet TAKE 1 TABLET EVERY DAY   levothyroxine (SYNTHROID) 137 MCG tablet TAKE 1 TABLET (137 MCG TOTAL) BY MOUTH DAILY. REPLACES 112 MCG DOSE.   linaclotide (LINZESS) 72 MCG capsule Take 1 capsule (72 mcg total) by mouth daily before breakfast. To keep bowels soft and regular   losartan (COZAAR) 50 MG tablet Take 1 tablet (50 mg total) by mouth daily.   memantine (NAMENDA) 5 MG tablet Take 1 tablet (5 mg total) by mouth 2 (two) times daily.   nitroGLYCERIN (NITROSTAT) 0.4 MG SL tablet DISSOLVE 1 TABLET UNDER TONGUE EVERY 5 MINUTES AS NEEDED FOR CHEST PAIN UP TO 3 DOSES   tiotropium (SPIRIVA HANDIHALER) 18 MCG inhalation capsule Place 1 capsule (18 mcg total) into inhaler and inhale daily as needed (shortness of breath).   No facility-administered encounter medications on file as of 01/03/2022.    Patient Active Problem List   Diagnosis Date Noted   Moderate late onset Alzheimer's dementia with mood disturbance ([place]) 10/11/2021   Hypothyroidism 10/10/2017   Chronic diastolic heart failure ([place]) 10/03/2017   Ischemic cardiomyopathy 10/03/2017   First degree heart block 10/03/2017   Retained lens material following cataract surgery of left eye 03/20/2017   Diabetic neuropathy with neurologic complication ([place]) 35/59/7416   Gastroesophageal reflux disease 07/12/2016   Chronic fatigue, unspecified 11/03/2015   Hilar mass 11/03/2015   Iron deficiency anemia 11/03/2015   CAFL (chronic airflow limitation) ([place]) 12/17/2013   Chronic obstructive pulmonary  disease ([place]) 12/17/2013   DM type 2 with diabetic dyslipidemia ([place]) 09/17/2013   Hypertension associated with diabetes ([place]) 09/17/2013   Carotid artery disease ([place]) 12/27/2009   DIABETES MELLITUS, TYPE II 04/30/2009   HYPERLIPIDEMIA 04/30/2009   Essential hypertension 04/30/2009   Coronary artery disease of native artery of native heart with stable angina pectoris ([place]) 04/30/2009    Conditions to be addressed/monitored:  Dementia  Care Plan : [facility] Care Plan  Updates made by Ilean China, RN since 01/03/2022 12:00 AM  Completed 01/03/2022   Problem: Chronic Disease Management Needs Resolved 01/03/2022  Priority: High  Onset Date: 06/28/2021     Long-Range Goal: Patient/Son will work with RN Care Manager Regarding Care Management and Care Coordination Associated with HTN, DM, COPD, Hypothyroidism, social isolation, Moderate Late Onset Alzheimers with Behavioral Disturbance and Advanced Age Completed 01/03/2022  Start Date: 06/28/2021  Expected End Date: 06/28/2022  Recent Progress: On track  Priority: High  Note:   01/03/2022 Patient stable and working with Palliative Care through [facility]. Closing RNCM care plans due to CCM program changes. Pt can be referred for Care Coordination services if needed.   Current Barriers:  Chronic Disease Management support and education needs related to HTN, COPD, and DMII Advanced age Lack of socialization  Risk for falls  RNCM Clinical Goal(s):  Patient will continue to work with Consulting civil engineer and/or Social Worker to address care management and care coordination needs related to HTN, COPD, DMII, Dementia, and hypothyroidism, social isolation, advanced age as evidenced by adherence to CM Team Scheduled appointments     through collaboration with Consulting civil engineer, provider, and care team.   Interventions: 1:1 collaboration with primary care provider regarding development and update of comprehensive plan of care as evidenced by provider attestation and co-signature Inter-disciplinary care team collaboration (see longitudinal plan of care) Evaluation of current treatment plan related to  self management and patient's adherence to plan as established by provider Talked with son, Sean Hartman", who is listed on DPR regarding current management of care Patient has moved back to his apartment where he lives alone. His son, Sean Hartman, and two grandson's alternate  visiting with him and he calls them as needed Therapeutic listening utilized regarding caregiver stress and disrupted family dynamics  Encouraged to keep all follow-up appointments   Diabetes Interventions:  (Status:  Condition stable.  Not addressed this visit.) Long Term Goal Assessed patient's understanding of A1c goal: <8% Provided education to patient about basic DM disease process Reviewed medications with patient and discussed importance of medication adherence Counseled on importance of regular laboratory monitoring as prescribed Discussed plans with patient for ongoing care management follow up and provided patient with direct contact information for care management team Advised patient, providing education and rationale, to check cbg daily and record, calling [phone] for findings outside established parameters Review of patient status, including review of consultants reports, relevant laboratory and other test results, and medications completed Assessed social determinant of health barriers Reviewed upcoming appointments.  Assessed transportation needs. Patient drives himself or his son will come with him.   Falls Interventions:  (Status:  Goal on track:  NO.) Long Term Goal Advised patient of importance of notifying provider of falls Assessed for falls since last encounter Assessed social determinant of health barriers Discussed mobility and ability to perform ADLs Discussed and encouraged use of assistive devices: Walker and shower chair Provided verbal education on fall prevention strategies Has had 3 mechanical falls in  the yard since last telephone visit   SDOH Barriers (Status:  Patient declined further engagement on this goal.) Short Term Goal Patient interviewed and SDOH assessment performed        SDOH Interventions    Flowsheet Row Most Recent Value  SDOH Interventions   Social Connections Interventions Refer to Congregational Nursing, Other (Comment)   [[facility] Meals with Friends Program]     Discussed palliative care and nursing assessment Discussed recent ED visit for epistaxis Discussed that patient has moved in with his grandson and his family Discussed that they are concerned about his safety and driving and they are not letting him drive at this time Reviewed upcoming appointment with PCP Encouraged to reach out to [place] as needed    Hypothyroidism:  (Status: Condition stable. Not addressed this visit.) Long Term Goal  Reviewed and discussed recent lab reports and elevated TSH Reviewed and discussed recent medication adjustment: levothyroxine 178mg to 1334m. Has not received 13757mfrom mail order yet.  Provided verbal education on importance of taking levothyroxine 30 minutes before other medications and food, especially acid reducers Discussed decreased energy level and fatigue. Hoping TSH correction will help with this.  Reviewed upcoming appt with PCP in March to rck TSH   Patient Goals/Self-Care Activities: Take medications as prescribed   Attend all scheduled provider appointments Perform all self care activities independently  Call provider office for new concerns or questions  check blood sugar at prescribed times: once daily and when you have symptoms of low or high blood sugar enter blood sugar readings and medication or insulin into daily log take the blood sugar meter to all doctor visits Reach out to PCP if Care Coordination services are needed Move carefully to avoid falls  Plan: Next PCP appointment scheduled for:   KriChong SicilianSN, RN-BC RN Care Coordinator Direct Dial: [phone]

## 2022-01-03 NOTE — Patient Instructions (Signed)
Visit Information  Thank you for taking time to visit with me today.   Following are the goals we discussed today:  Take medications as prescribed   Attend all scheduled provider appointments Perform all self care activities independently  Call provider office for new concerns or questions  check blood sugar at prescribed times: once daily and when you have symptoms of low or high blood sugar enter blood sugar readings and medication or insulin into daily log take the blood sugar meter to all doctor visits Reach out to PCP if Care Coordination services are needed Move carefully to avoid falls    I have enjoyed working with you through the Chronic Care Management Program at [facility].  Due to program changes I am removing myself from your care team.   If you are currently active with another CCM Team Member, you will remain active with them unless they reach out to you with additional information.   If you feel that you need services in the future,  please talk with your primary care provider and request a new referral for Care Management or Care Coordination services. This does not affect your status as a patient at [facility].   Thank you for allowing me to participate in your your healthcare journey.   If you are experiencing a Mental Health or [place] or need someone to talk to, please call the [facility]: [phone] call [phone]   Patient verbalizes understanding of instructions and care plan provided today and agrees to view in [place]. Active MyChart status and patient understanding of how to access instructions and care plan via MyChart confirmed with patient.     Chong Sicilian, BSN, RN-BC Proofreader Dial: [phone]

## 2022-02-13 ENCOUNTER — Other Ambulatory Visit: Payer: Self-pay | Admitting: Family Medicine

## 2022-02-13 DIAGNOSIS — E1140 Type 2 diabetes mellitus with diabetic neuropathy, unspecified: Secondary | ICD-10-CM

## 2022-02-13 DIAGNOSIS — I10 Essential (primary) hypertension: Secondary | ICD-10-CM

## 2022-02-14 ENCOUNTER — Ambulatory Visit (INDEPENDENT_AMBULATORY_CARE_PROVIDER_SITE_OTHER): Payer: Medicare HMO | Admitting: Family Medicine

## 2022-02-14 ENCOUNTER — Encounter: Payer: Self-pay | Admitting: Family Medicine

## 2022-02-14 VITALS — BP 121/71 | HR 77 | Temp 97.5°F | Ht 70.0 in | Wt 152.6 lb

## 2022-02-14 DIAGNOSIS — E039 Hypothyroidism, unspecified: Secondary | ICD-10-CM

## 2022-02-14 DIAGNOSIS — E1149 Type 2 diabetes mellitus with other diabetic neurological complication: Secondary | ICD-10-CM

## 2022-02-14 DIAGNOSIS — E1159 Type 2 diabetes mellitus with other circulatory complications: Secondary | ICD-10-CM | POA: Diagnosis not present

## 2022-02-14 DIAGNOSIS — I152 Hypertension secondary to endocrine disorders: Secondary | ICD-10-CM

## 2022-02-14 DIAGNOSIS — E1140 Type 2 diabetes mellitus with diabetic neuropathy, unspecified: Secondary | ICD-10-CM | POA: Diagnosis not present

## 2022-02-14 DIAGNOSIS — E1169 Type 2 diabetes mellitus with other specified complication: Secondary | ICD-10-CM

## 2022-02-14 DIAGNOSIS — I10 Essential (primary) hypertension: Secondary | ICD-10-CM | POA: Diagnosis not present

## 2022-02-14 DIAGNOSIS — Z13220 Encounter for screening for lipoid disorders: Secondary | ICD-10-CM | POA: Diagnosis not present

## 2022-02-14 DIAGNOSIS — E785 Hyperlipidemia, unspecified: Secondary | ICD-10-CM | POA: Diagnosis not present

## 2022-02-14 LAB — BAYER DCA HB A1C WAIVED: HB A1C (BAYER DCA - WAIVED): 8.1 % — ABNORMAL HIGH (ref 4.8–5.6)

## 2022-02-14 MED ORDER — TIOTROPIUM BROMIDE MONOHYDRATE 18 MCG IN CAPS: 18.0000 ug | ORAL_CAPSULE | Freq: Every day | RESPIRATORY_TRACT | 3 refills | Status: DC | PRN

## 2022-02-14 MED ORDER — LINACLOTIDE 72 MCG PO CAPS
72.0000 ug | ORAL_CAPSULE | Freq: Every day | ORAL | 3 refills | Status: DC
Start: 2022-02-14 — End: 2022-05-10

## 2022-02-14 MED ORDER — DONEPEZIL HCL 10 MG PO TABS
10.0000 mg | ORAL_TABLET | Freq: Every day | ORAL | 3 refills | Status: DC
Start: 2022-02-14 — End: 2022-05-10

## 2022-02-14 MED ORDER — EMPAGLIFLOZIN 10 MG PO TABS: 10.0000 mg | ORAL_TABLET | Freq: Every day | ORAL | 3 refills | Status: DC

## 2022-02-14 MED ORDER — CARVEDILOL 6.25 MG PO TABS: 6.2500 mg | ORAL_TABLET | Freq: Two times a day (BID) | ORAL | 3 refills | Status: DC

## 2022-02-14 NOTE — Progress Notes (Signed)
Subjective:  Patient ID: Sean Hartman, male    DOB: 1922-11-13  Age: 88 y.o. MRN: [id number]  CC: Medical Management of Chronic Issues   HPI Sean Hartman presents forFollow-up of diabetes. Patient checks blood sugar at home.   140 fasting and 160 postprandial Patient denies symptoms such as polyuria, polydipsia, excessive hunger, nausea No significant hypoglycemic spells noted. Medications reviewed. Pt reports taking them regularly without complication/adverse reaction being reported today.    History Sean Hartman has a past medical history of Anemia, CAD (coronary artery disease), Carotid stenosis, Colon cancer ([place]), DM2 (diabetes mellitus, type 2) ([place]), Essential hypertension, HLD (hyperlipidemia), Ischemic cardiomyopathy, and Status post partial colectomy (11/19/2015).   He has a past surgical history that includes Cataract extraction w/ intraocular lens  implant, bilateral; Colectomy; Coronary angioplasty with stent; Inguinal hernia repair (Left); 25 gauge pars plana vitrectomy with 20 gauge mvr port (Left, 03/20/2017); and 25 gauge pars plana vitrectomy with 20 gauge mvr port (Left, 03/20/2017).   His family history includes Hypertension in his father.He reports that he quit smoking about 23 years ago. His smoking use included cigarettes. He has never used smokeless tobacco. He reports that he does not drink alcohol and does not use drugs.  Current Outpatient Medications on File Prior to Visit  Medication Sig Dispense Refill   amLODipine (NORVASC) 2.5 MG tablet TAKE 1 TABLET EVERY DAY 90 tablet 0   aspirin 81 MG EC tablet Take 81 mg by mouth daily.     famotidine (PEPCID) 20 MG tablet Take 1 tablet (20 mg total) by mouth daily. 90 tablet 3   furosemide (LASIX) 40 MG tablet TAKE 1 TABLET EVERY DAY AS NEEDED  (INCREASED  WEIGHT  GAIN OR SWELLING) 90 tablet 3   glucose blood (ACCU-CHEK GUIDE) test strip TEST BLOOD SUGAR TWICE DAILY AS DIRECTED Dx E11.9 200 strip 3   JANUVIA 100  MG tablet TAKE 1 TABLET EVERY DAY 90 tablet 3   levothyroxine (SYNTHROID) 137 MCG tablet TAKE 1 TABLET (137 MCG TOTAL) BY MOUTH DAILY. REPLACES 112 MCG DOSE. 90 tablet 1   losartan (COZAAR) 50 MG tablet TAKE 1 TABLET EVERY DAY 90 tablet 0   memantine (NAMENDA) 5 MG tablet Take 1 tablet (5 mg total) by mouth 2 (two) times daily. 180 tablet 1   nitroGLYCERIN (NITROSTAT) 0.4 MG SL tablet DISSOLVE 1 TABLET UNDER TONGUE EVERY 5 MINUTES AS NEEDED FOR CHEST PAIN UP TO 3 DOSES 25 tablet 3   No current facility-administered medications on file prior to visit.    ROS Review of Systems  Constitutional:  Negative for fever.  Respiratory:  Negative for shortness of breath.   Cardiovascular:  Negative for chest pain.  Musculoskeletal:  Negative for arthralgias.  Skin:  Negative for rash.    Objective:  BP 121/71   Pulse 77   Temp (!) 97.5 F (36.4 C)   Ht $R'5\' 10"'GP$  (1.778 m)   Wt 152 lb 9.6 oz (69.2 kg)   SpO2 96%   BMI 21.90 kg/m   BP Readings from Last 3 Encounters:  02/14/22 121/71  11/14/21 137/67  10/11/21 122/66    Wt Readings from Last 3 Encounters:  02/14/22 152 lb 9.6 oz (69.2 kg)  11/14/21 153 lb 3.2 oz (69.5 kg)  10/11/21 146 lb 12.8 oz (66.6 kg)     Physical Exam Vitals reviewed.  Constitutional:      Appearance: He is well-developed.     Comments: Elderly   HENT:  Head: Normocephalic and atraumatic.     Right Ear: External ear normal.     Left Ear: External ear normal.     Mouth/Throat:     Pharynx: No oropharyngeal exudate or posterior oropharyngeal erythema.  Eyes:     Pupils: Pupils are equal, round, and reactive to light.  Cardiovascular:     Rate and Rhythm: Normal rate and regular rhythm.     Heart sounds: No murmur heard. Pulmonary:     Effort: No respiratory distress.     Breath sounds: Normal breath sounds.  Musculoskeletal:     Cervical back: Normal range of motion and neck supple.  Neurological:     Mental Status: He is alert and oriented to  person, place, and time.  Psychiatric:        Mood and Affect: Mood normal.        Behavior: Behavior normal.     Comments: Mildly paranoid regarding family's motives       Assessment & Plan:   Sadao was seen today for medical management of chronic issues.  Diagnoses and all orders for this visit:  Hypertension associated with diabetes ([place]) -     CMP14+EGFR -     CBC with Differential/Platelet  DM type 2 with diabetic dyslipidemia ([place]) -     Bayer DCA Hb A1c Waived  Hypothyroidism, unspecified type -     TSH + free T4  Lipid screening -     Lipid panel  Essential hypertension -     carvedilol (COREG) 6.25 MG tablet; Take 1 tablet (6.25 mg total) by mouth 2 (two) times daily with a meal.  Diabetic neuropathy with neurologic complication (HCC) -     carvedilol (COREG) 6.25 MG tablet; Take 1 tablet (6.25 mg total) by mouth 2 (two) times daily with a meal.  Other orders -     donepezil (ARICEPT) 10 MG tablet; Take 1 tablet (10 mg total) by mouth at bedtime. -     linaclotide (LINZESS) 72 MCG capsule; Take 1 capsule (72 mcg total) by mouth daily before breakfast. To keep bowels soft and regular -     tiotropium (SPIRIVA HANDIHALER) 18 MCG inhalation capsule; Place 1 capsule (18 mcg total) into inhaler and inhale daily as needed (shortness of breath). -     empagliflozin (JARDIANCE) 10 MG TABS tablet; Take 1 tablet (10 mg total) by mouth daily.      I am having Nikki Dom. Preyer start on empagliflozin. I am also having him maintain his aspirin EC, Accu-Chek Guide, nitroGLYCERIN, famotidine, Januvia, levothyroxine, memantine, furosemide, amLODipine, losartan, carvedilol, donepezil, linaclotide, and tiotropium.  Meds ordered this encounter  Medications   carvedilol (COREG) 6.25 MG tablet    Sig: Take 1 tablet (6.25 mg total) by mouth 2 (two) times daily with a meal.    Dispense:  180 tablet    Refill:  3   donepezil (ARICEPT) 10 MG tablet    Sig: Take 1 tablet (10 mg  total) by mouth at bedtime.    Dispense:  90 tablet    Refill:  3   linaclotide (LINZESS) 72 MCG capsule    Sig: Take 1 capsule (72 mcg total) by mouth daily before breakfast. To keep bowels soft and regular    Dispense:  90 capsule    Refill:  3   tiotropium (SPIRIVA HANDIHALER) 18 MCG inhalation capsule    Sig: Place 1 capsule (18 mcg total) into inhaler and inhale daily as needed (shortness of breath).  Dispense:  90 capsule    Refill:  3   empagliflozin (JARDIANCE) 10 MG TABS tablet    Sig: Take 1 tablet (10 mg total) by mouth daily.    Dispense:  90 tablet    Refill:  3     Follow-up: Return in about 3 months (around 05/16/2022).  Claretta Fraise, M.D.

## 2022-02-15 LAB — CMP14+EGFR
ALT: 11 IU/L (ref 0–44)
AST: 20 IU/L (ref 0–40)
Albumin/Globulin Ratio: 1.7 (ref 1.2–2.2)
Albumin: 4.1 g/dL (ref 3.6–4.6)
Alkaline Phosphatase: 74 IU/L (ref 44–121)
BUN/Creatinine Ratio: 21 (ref 10–24)
BUN: 23 mg/dL (ref 10–36)
Bilirubin Total: 0.5 mg/dL (ref 0.0–1.2)
CO2: 25 mmol/L (ref 20–29)
Calcium: 9.9 mg/dL (ref 8.6–10.2)
Chloride: 97 mmol/L (ref 96–106)
Creatinine, Ser: 1.1 mg/dL (ref 0.76–1.27)
Globulin, Total: 2.4 g/dL (ref 1.5–4.5)
Glucose: 296 mg/dL — ABNORMAL HIGH (ref 70–99)
Potassium: 4.9 mmol/L (ref 3.5–5.2)
Sodium: 135 mmol/L (ref 134–144)
Total Protein: 6.5 g/dL (ref 6.0–8.5)
eGFR: 61 mL/min/{1.73_m2} (ref >59)

## 2022-02-15 LAB — LIPID PANEL
Chol/HDL Ratio: 4.5 ratio (ref 0.0–5.0)
Cholesterol, Total: 195 mg/dL (ref 100–199)
HDL: 43 mg/dL (ref >39)
LDL Chol Calc (NIH): 122 mg/dL — ABNORMAL HIGH (ref 0–99)
Triglycerides: 169 mg/dL — ABNORMAL HIGH (ref 0–149)
VLDL Cholesterol Cal: 30 mg/dL (ref 5–40)

## 2022-02-15 LAB — TSH+FREE T4
Free T4: 1.82 ng/dL — ABNORMAL HIGH (ref 0.82–1.77)
TSH: 2.8 u[IU]/mL (ref 0.450–4.500)

## 2022-02-15 LAB — CBC WITH DIFFERENTIAL/PLATELET
Basophils Absolute: 0.1 10*3/uL (ref 0.0–0.2)
Basos: 1 %
EOS (ABSOLUTE): 0.1 10*3/uL (ref 0.0–0.4)
Eos: 2 %
Hematocrit: 42.9 % (ref 37.5–51.0)
Hemoglobin: 14.6 g/dL (ref 13.0–17.7)
Immature Grans (Abs): 0 10*3/uL (ref 0.0–0.1)
Immature Granulocytes: 0 %
Lymphocytes Absolute: 0.6 10*3/uL — ABNORMAL LOW (ref 0.7–3.1)
Lymphs: 11 %
MCH: 31.9 pg (ref 26.6–33.0)
MCHC: 34 g/dL (ref 31.5–35.7)
MCV: 94 fL (ref 79–97)
Monocytes Absolute: 0.5 10*3/uL (ref 0.1–0.9)
Monocytes: 9 %
Neutrophils Absolute: 4.2 10*3/uL (ref 1.4–7.0)
Neutrophils: 77 %
Platelets: 157 10*3/uL (ref 150–450)
RBC: 4.57 x10E6/uL (ref 4.14–5.80)
RDW: 12.6 % (ref 11.6–15.4)
WBC: 5.5 10*3/uL (ref 3.4–10.8)

## 2022-02-15 NOTE — Progress Notes (Signed)
Hello Sean Hartman,  Your lab result is normal and/or stable.Some minor variations that are not significant are commonly marked abnormal, but do not represent any medical problem for you.  Best regards, Maddilynn Esperanza, M.D.

## 2022-02-21 ENCOUNTER — Telehealth: Payer: Self-pay | Admitting: *Deleted

## 2022-02-21 MED ORDER — TIOTROPIUM BROMIDE MONOHYDRATE 18 MCG IN CAPS: 18.0000 ug | ORAL_CAPSULE | Freq: Every day | RESPIRATORY_TRACT | 3 refills | Status: DC

## 2022-02-21 NOTE — Telephone Encounter (Signed)
Fax from [facility]: Tiotropium Brom 18 mcg cap INH w/ directions inhale 1 cap QD prn, usually dosed scheduled on a daily basis Please clarify Per Dr. Livia Snellen written note on fax - change to QD not prn. Fax was sent back. Updated script for future refills.

## 2022-02-28 ENCOUNTER — Telehealth: Payer: Self-pay | Admitting: Family Medicine

## 2022-02-28 MED ORDER — ACCU-CHEK GUIDE VI STRP: ORAL_STRIP | 3 refills | Status: DC

## 2022-02-28 NOTE — Telephone Encounter (Signed)
  Prescription Request  02/28/2022  What is the name of the medication or equipment? ACCU CHECK TEST STRIPS  Have you contacted your pharmacy to request a refill? YES  Which pharmacy would you like this sent to? [facility] [place]

## 2022-02-28 NOTE — Telephone Encounter (Signed)
Aware test strips sent to [facility]

## 2022-05-07 ENCOUNTER — Other Ambulatory Visit: Payer: Self-pay | Admitting: Family Medicine

## 2022-05-08 ENCOUNTER — Emergency Department (HOSPITAL_COMMUNITY): Payer: Medicare HMO

## 2022-05-08 ENCOUNTER — Inpatient Hospital Stay (HOSPITAL_COMMUNITY)
Admission: EM | Admit: 2022-05-08 | Discharge: 2022-05-10 | DRG: 065 | Disposition: A | Discharge status: Hospice | Payer: Medicare HMO | Attending: Family Medicine | Admitting: Family Medicine

## 2022-05-08 DIAGNOSIS — R29727 NIHSS score 27: Secondary | ICD-10-CM | POA: Diagnosis present

## 2022-05-08 DIAGNOSIS — E1165 Type 2 diabetes mellitus with hyperglycemia: Secondary | ICD-10-CM | POA: Diagnosis present

## 2022-05-08 DIAGNOSIS — F0284 Dementia in other diseases classified elsewhere, unspecified severity, with anxiety: Secondary | ICD-10-CM | POA: Diagnosis present

## 2022-05-08 DIAGNOSIS — E1149 Type 2 diabetes mellitus with other diabetic neurological complication: Secondary | ICD-10-CM

## 2022-05-08 DIAGNOSIS — G301 Alzheimer's disease with late onset: Secondary | ICD-10-CM | POA: Diagnosis present

## 2022-05-08 DIAGNOSIS — Z8249 Family history of ischemic heart disease and other diseases of the circulatory system: Secondary | ICD-10-CM

## 2022-05-08 DIAGNOSIS — E039 Hypothyroidism, unspecified: Secondary | ICD-10-CM | POA: Diagnosis present

## 2022-05-08 DIAGNOSIS — R4781 Slurred speech: Secondary | ICD-10-CM | POA: Diagnosis present

## 2022-05-08 DIAGNOSIS — G8194 Hemiplegia, unspecified affecting left nondominant side: Secondary | ICD-10-CM | POA: Diagnosis present

## 2022-05-08 DIAGNOSIS — I5032 Chronic diastolic (congestive) heart failure: Secondary | ICD-10-CM | POA: Diagnosis present

## 2022-05-08 DIAGNOSIS — Z79899 Other long term (current) drug therapy: Secondary | ICD-10-CM

## 2022-05-08 DIAGNOSIS — I6523 Occlusion and stenosis of bilateral carotid arteries: Secondary | ICD-10-CM | POA: Diagnosis not present

## 2022-05-08 DIAGNOSIS — Z7982 Long term (current) use of aspirin: Secondary | ICD-10-CM

## 2022-05-08 DIAGNOSIS — Z66 Do not resuscitate: Secondary | ICD-10-CM | POA: Diagnosis present

## 2022-05-08 DIAGNOSIS — R000 Tachycardia, unspecified: Secondary | ICD-10-CM | POA: Diagnosis not present

## 2022-05-08 DIAGNOSIS — E1122 Type 2 diabetes mellitus with diabetic chronic kidney disease: Secondary | ICD-10-CM | POA: Diagnosis present

## 2022-05-08 DIAGNOSIS — I255 Ischemic cardiomyopathy: Secondary | ICD-10-CM | POA: Diagnosis present

## 2022-05-08 DIAGNOSIS — K219 Gastro-esophageal reflux disease without esophagitis: Secondary | ICD-10-CM | POA: Diagnosis present

## 2022-05-08 DIAGNOSIS — I4891 Unspecified atrial fibrillation: Secondary | ICD-10-CM | POA: Diagnosis not present

## 2022-05-08 DIAGNOSIS — R0682 Tachypnea, not elsewhere classified: Secondary | ICD-10-CM | POA: Diagnosis not present

## 2022-05-08 DIAGNOSIS — I2510 Atherosclerotic heart disease of native coronary artery without angina pectoris: Secondary | ICD-10-CM | POA: Diagnosis present

## 2022-05-08 DIAGNOSIS — Z7189 Other specified counseling: Secondary | ICD-10-CM | POA: Diagnosis not present

## 2022-05-08 DIAGNOSIS — R531 Weakness: Secondary | ICD-10-CM | POA: Diagnosis not present

## 2022-05-08 DIAGNOSIS — I639 Cerebral infarction, unspecified: Secondary | ICD-10-CM | POA: Diagnosis not present

## 2022-05-08 DIAGNOSIS — R29810 Facial weakness: Secondary | ICD-10-CM | POA: Diagnosis not present

## 2022-05-08 DIAGNOSIS — I130 Hypertensive heart and chronic kidney disease with heart failure and stage 1 through stage 4 chronic kidney disease, or unspecified chronic kidney disease: Secondary | ICD-10-CM | POA: Diagnosis present

## 2022-05-08 DIAGNOSIS — J438 Other emphysema: Secondary | ICD-10-CM | POA: Diagnosis not present

## 2022-05-08 DIAGNOSIS — I771 Stricture of artery: Secondary | ICD-10-CM | POA: Diagnosis not present

## 2022-05-08 DIAGNOSIS — Z515 Encounter for palliative care: Secondary | ICD-10-CM

## 2022-05-08 DIAGNOSIS — I10 Essential (primary) hypertension: Secondary | ICD-10-CM | POA: Diagnosis not present

## 2022-05-08 DIAGNOSIS — Z79890 Hormone replacement therapy (postmenopausal): Secondary | ICD-10-CM

## 2022-05-08 DIAGNOSIS — J449 Chronic obstructive pulmonary disease, unspecified: Secondary | ICD-10-CM | POA: Diagnosis present

## 2022-05-08 DIAGNOSIS — N1831 Chronic kidney disease, stage 3a: Secondary | ICD-10-CM | POA: Diagnosis present

## 2022-05-08 DIAGNOSIS — G319 Degenerative disease of nervous system, unspecified: Secondary | ICD-10-CM | POA: Diagnosis not present

## 2022-05-08 DIAGNOSIS — Z9049 Acquired absence of other specified parts of digestive tract: Secondary | ICD-10-CM

## 2022-05-08 DIAGNOSIS — E1140 Type 2 diabetes mellitus with diabetic neuropathy, unspecified: Secondary | ICD-10-CM | POA: Diagnosis present

## 2022-05-08 DIAGNOSIS — I252 Old myocardial infarction: Secondary | ICD-10-CM

## 2022-05-08 DIAGNOSIS — Z87891 Personal history of nicotine dependence: Secondary | ICD-10-CM

## 2022-05-08 DIAGNOSIS — E782 Mixed hyperlipidemia: Secondary | ICD-10-CM | POA: Diagnosis present

## 2022-05-08 DIAGNOSIS — Z85038 Personal history of other malignant neoplasm of large intestine: Secondary | ICD-10-CM

## 2022-05-08 DIAGNOSIS — I63511 Cerebral infarction due to unspecified occlusion or stenosis of right middle cerebral artery: Principal | ICD-10-CM | POA: Diagnosis present

## 2022-05-08 DIAGNOSIS — R29898 Other symptoms and signs involving the musculoskeletal system: Secondary | ICD-10-CM | POA: Diagnosis not present

## 2022-05-08 LAB — DIFFERENTIAL
Abs Immature Granulocytes: 0.06 10*3/uL (ref 0.00–0.07)
Basophils Absolute: 0 10*3/uL (ref 0.0–0.1)
Basophils Relative: 0 %
Eosinophils Absolute: 0 10*3/uL (ref 0.0–0.5)
Eosinophils Relative: 0 %
Immature Granulocytes: 1 %
Lymphocytes Relative: 3 %
Lymphs Abs: 0.3 10*3/uL — ABNORMAL LOW (ref 0.7–4.0)
Monocytes Absolute: 0.5 10*3/uL (ref 0.1–1.0)
Monocytes Relative: 4 %
Neutro Abs: 9.9 10*3/uL — ABNORMAL HIGH (ref 1.7–7.7)
Neutrophils Relative %: 92 %

## 2022-05-08 LAB — COMPREHENSIVE METABOLIC PANEL
ALT: 15 U/L (ref 0–44)
AST: 27 U/L (ref 15–41)
Albumin: 4.1 g/dL (ref 3.5–5.0)
Alkaline Phosphatase: 58 U/L (ref 38–126)
Anion gap: 16 — ABNORMAL HIGH (ref 5–15)
BUN: 24 mg/dL — ABNORMAL HIGH (ref 8–23)
CO2: 22 mmol/L (ref 22–32)
Calcium: 9.5 mg/dL (ref 8.9–10.3)
Chloride: 98 mmol/L (ref 98–111)
Creatinine, Ser: 1.2 mg/dL (ref 0.61–1.24)
GFR, Estimated: 55 mL/min — ABNORMAL LOW (ref >60)
Glucose, Bld: 282 mg/dL — ABNORMAL HIGH (ref 70–99)
Potassium: 4.1 mmol/L (ref 3.5–5.1)
Sodium: 136 mmol/L (ref 135–145)
Total Bilirubin: 1.5 mg/dL — ABNORMAL HIGH (ref 0.3–1.2)
Total Protein: 7.4 g/dL (ref 6.5–8.1)

## 2022-05-08 LAB — PROTIME-INR
INR: 1.1 (ref 0.8–1.2)
Prothrombin Time: 13.8 seconds (ref 11.4–15.2)

## 2022-05-08 LAB — CBC
HCT: 50.9 % (ref 39.0–52.0)
Hemoglobin: 17.1 g/dL — ABNORMAL HIGH (ref 13.0–17.0)
MCH: 32.5 pg (ref 26.0–34.0)
MCHC: 33.6 g/dL (ref 30.0–36.0)
MCV: 96.8 fL (ref 80.0–100.0)
Platelets: 176 10*3/uL (ref 150–400)
RBC: 5.26 MIL/uL (ref 4.22–5.81)
RDW: 13.4 % (ref 11.5–15.5)
WBC: 10.8 10*3/uL — ABNORMAL HIGH (ref 4.0–10.5)
nRBC: 0 % (ref 0.0–0.2)

## 2022-05-08 LAB — I-STAT CHEM 8, ED
BUN: 26 mg/dL — ABNORMAL HIGH (ref 8–23)
Calcium, Ion: 1.24 mmol/L (ref 1.15–1.40)
Chloride: 98 mmol/L (ref 98–111)
Creatinine, Ser: 1 mg/dL (ref 0.61–1.24)
Glucose, Bld: 271 mg/dL — ABNORMAL HIGH (ref 70–99)
HCT: 54 % — ABNORMAL HIGH (ref 39.0–52.0)
Hemoglobin: 18.4 g/dL — ABNORMAL HIGH (ref 13.0–17.0)
Potassium: 4.3 mmol/L (ref 3.5–5.1)
Sodium: 138 mmol/L (ref 135–145)
TCO2: 23 mmol/L (ref 22–32)

## 2022-05-08 LAB — APTT: aPTT: 25 seconds (ref 24–36)

## 2022-05-08 LAB — ETHANOL: Alcohol, Ethyl (B): <10 mg/dL (ref <10)

## 2022-05-08 MED ORDER — STROKE: EARLY STAGES OF RECOVERY BOOK
Freq: Once | Status: DC
  Filled 2022-05-08: qty 1

## 2022-05-08 MED ORDER — HEPARIN SODIUM (PORCINE) 5000 UNIT/ML IJ SOLN
5000.0000 [IU] | Freq: Three times a day (TID) | INTRAMUSCULAR | Status: DC
  Administered 2022-05-08 – 2022-05-09 (×2): 5000 [IU] via SUBCUTANEOUS
  Filled 2022-05-08 (×2): qty 1

## 2022-05-08 MED ORDER — ACETAMINOPHEN 325 MG PO TABS: 650.0000 mg | ORAL_TABLET | ORAL | Status: DC | PRN

## 2022-05-08 MED ORDER — LACTATED RINGERS IV BOLUS
500.0000 mL | Freq: Once | INTRAVENOUS | Status: AC
  Administered 2022-05-08: 500 mL via INTRAVENOUS

## 2022-05-08 MED ORDER — LEVOTHYROXINE SODIUM 100 MCG/5ML IV SOLN
68.5000 ug | Freq: Every day | INTRAVENOUS | Status: DC
  Administered 2022-05-09: 68.5 ug via INTRAVENOUS
  Filled 2022-05-08 (×3): qty 5

## 2022-05-08 MED ORDER — IOHEXOL 350 MG/ML SOLN
100.0000 mL | Freq: Once | INTRAVENOUS | Status: AC | PRN
  Administered 2022-05-08: 100 mL via INTRAVENOUS

## 2022-05-08 MED ORDER — ACETAMINOPHEN 160 MG/5ML PO SOLN: 650.0000 mg | ORAL | Status: DC | PRN

## 2022-05-08 MED ORDER — ASPIRIN 300 MG RE SUPP
300.0000 mg | Freq: Every day | RECTAL | Status: DC
  Administered 2022-05-09: 300 mg via RECTAL
  Filled 2022-05-08: qty 1

## 2022-05-08 MED ORDER — SODIUM CHLORIDE 0.9 % IV SOLN: INTRAVENOUS | Status: DC

## 2022-05-08 MED ORDER — LACTATED RINGERS IV SOLN: INTRAVENOUS | Status: DC

## 2022-05-08 MED ORDER — SENNOSIDES-DOCUSATE SODIUM 8.6-50 MG PO TABS: 1.0000 | ORAL_TABLET | Freq: Every evening | ORAL | Status: DC | PRN

## 2022-05-08 MED ORDER — INSULIN ASPART 100 UNIT/ML IJ SOLN
0.0000 [IU] | INTRAMUSCULAR | Status: DC
  Administered 2022-05-08: 3 [IU] via SUBCUTANEOUS
  Administered 2022-05-09: 2 [IU] via SUBCUTANEOUS
  Administered 2022-05-09: 1 [IU] via SUBCUTANEOUS
  Administered 2022-05-09: 3 [IU] via SUBCUTANEOUS
  Filled 2022-05-08 (×4): qty 1

## 2022-05-08 MED ORDER — ACETAMINOPHEN 650 MG RE SUPP: 650.0000 mg | RECTAL | Status: DC | PRN

## 2022-05-08 NOTE — Consult Note (Addendum)
[facility] Telemedicine Consult   Requesting Provider: Acie Fredrickson Consult Participants: Patient, nurses Location of the provider: [facility] Location of the patient: [facility]  This consult was provided via telemedicine with 2-way video and audio communication. The patient/family was informed that care would be provided in this way and agreed to receive care in this manner.    Chief Complaint: Left-sided weakness  HPI: He was last in his normal state of health yesterday around 6 PM.  This morning his grandson was calling repeatedly, therefore his son asked a neighbor to check on him who went over and found him between the bed and the wall and EMS was called.  Due to positive LVO findings within 24 hours, code stroke was activated in route.  At baseline, he is independent, cooks for himself and does his own cleaning and laundry.    LKW: 12/3 6 PM tpa given?: No, outside of window IR Thrombectomy? No, large area of core Modified Rankin Scale: 2-Slight disability-UNABLE to perform all activities but does not need assistance  Exam: There were no vitals filed for this visit.  General: in bed, appears uncomfortable  1A: Level of Consciousness - 0 1B: Ask Month and Age - 2 1C: 'Blink Eyes' & 'Squeeze Hands' - 0 2: Test Horizontal Extraocular Movements - 0 3: Test Visual Fields - 2 4: Test Facial Palsy - 2 5A: Test Left Arm Motor Drift - 4 5B: Test Right Arm Motor Drift - 2 6A: Test Left Leg Motor Drift - 4 6B: Test Right Leg Motor Drift - 3 7: Test Limb Ataxia - 0 8: Test Sensation - 1 9: Test Language/Aphasia- 3 10: Test Dysarthria - 2 11: Test Extinction/Inattention - 2 NIHSS score: 27   Imaging Reviewed: CT head-moderate MCA branch infarct  Labs reviewed in epic and pertinent values follow: Cr 1.0   Assessment: 88 year old male with right M2 occlusion which is apparently the symptomatic lesion.  He also has occluded his  carotid.  He already has a fairly large area of infarct, and is not a candidate for any type of intervention.  There is a chance that he could worsen, though with the size of infarct seen on CT, I suspect that he will avoid any type of malignant edema.  If he continues to worsen, or develops further infarct, then may need to consider 3% normal saline.  Given this, if family wishes to pursue aggressive care, then I do think transfer to [facility] would be preferable.  As of my initial conversation with the family, they wanted to pursue supportive measures, but if they have the chance to process and want to pursue only comfort measures I think this would be reasonable in a 88 year old who is likely to be fairly debilitated by this.  Recommendations:  - HgbA1c, fasting lipid panel - MRI of the brain without contrast - Frequent neuro checks - Echocardiogram - Prophylactic therapy-Antiplatelet med: Aspirin - dose 325 mg p.o. or 300 mg PR - Risk factor modification - Telemetry monitoring - PT consult, OT consult, Speech consult   Roland Rack, MD [facility] [phone]  If 7pm- 7am, please page neurology on call as listed in [place].

## 2022-05-08 NOTE — Progress Notes (Addendum)
Telestroke RN Note  1226: Code stroke activated via cart for EMS pre alert.  1228: Page sent to Dr. Leonel Ramsay.   1231: Patient presents to the ED via EMS. EDP at bedside assessing patient. [place] 1600 on 05/07/22.   1233: Dr. Leonel Ramsay virtually present.  1235: Patient taken to CT.   1258: Patient back in room from CT. Dr. Tobias Alexander assessing patient. MRS 0 & NIHSS 27.   1309: Signed off cart.

## 2022-05-08 NOTE — ED Triage Notes (Signed)
Pt BIBA from home. Pt LKW 1600 yesterday, Sunday, 05/07/22. Meals on Wheels found pt down between bed and wall. Pt has left sided deficits, garbled speech.

## 2022-05-08 NOTE — ED Notes (Signed)
Neurology assessment via teleneuro

## 2022-05-08 NOTE — ED Notes (Signed)
Pt to CT

## 2022-05-08 NOTE — Hospital Course (Signed)
88 year old male with a history of hypertension, hyperlipidemia, diabetes mellitus type 2, coronary disease, COPD, hypothyroidism was found down in the morning of 05/08/2022.  At baseline, the patient has cognitive impairment, but is able to perform his activities of daily living including cooking, bathing, and clothing.  He lives by himself.  Apparently, the patient's last known stated normal was around 6 PM on 05/07/2022.  On the morning of 05/08/2022, the patient's son was trying to repeatedly called the patient with no answer.  He sent a neighbor to go check on the patient.  The patient was found on the floor stuck between the bed and the wall.  EMS was activated.  Code stroke was activated in route.  The patient was noted to have slurred speech with right gaze preference. At the time of my evaluation, the patient is unable to provide any history.  He arouses to protopathic stimuli but does not follow command nor cannot speak.   In the ED, the patient was afebrile but hemodynamically stable albeit with soft blood pressures.  WBC 10.8, hemoglobin 17.1, platelets 176,000.  Sodium 136, potassium 4.1, bicarbonate 22, serum creatinine 1.20.  LFTs were unremarkable.  CTA head neck showed an acute right MCA infarct.  There is an occluded right common carotid and right internal carotid artery.  There was an occlusion of the left ICA also.  There is a 73 cc core infarct in the right MCA distribution. Neurology was consulted and did not feel the patient was a tPA candidate.  Did not feel the patient was candidate for any aggressive intervention.  Transfer to [facility] was requested for further evaluation and treatment.

## 2022-05-08 NOTE — ED Notes (Signed)
Unable to perform SSS.

## 2022-05-08 NOTE — ED Provider Notes (Signed)
[facility] EMERGENCY DEPARTMENT Provider Note   CSN: [id number] Arrival date & time: 05/08/22  1231  An emergency department physician performed an initial assessment on this suspected stroke patient at 1232.  History  Chief Complaint  Patient presents with   Code Stroke    Sean Hartman is a 88 y.o. male.  HPI Patient presents for abdominal status.  Last known well was 4 PM yesterday.  Patient lives alone.  When he was found today, he was on the floor.  He has unintelligible speech, left-sided flaccidity, and right gaze deviation.  He is on aspirin but has not prescribed any other blood thinning medications.  Medical history is otherwise notable for DM, HLD, HTN, CAD, CHF, neuropathy, GERD, hypothyroidism, COPD.    Home Medications Prior to Admission medications   Medication Sig Start Date End Date Taking? Authorizing Provider  amLODipine (NORVASC) 2.5 MG tablet TAKE 1 TABLET EVERY DAY Patient taking differently: Take 2.5 mg by mouth daily. 02/13/22  Yes Claretta Fraise, MD  aspirin 81 MG EC tablet Take 81 mg by mouth daily.   Yes [provider]  carvedilol (COREG) 6.25 MG tablet Take 1 tablet (6.25 mg total) by mouth 2 (two) times daily with a meal. 02/14/22  Yes Claretta Fraise, MD  donepezil (ARICEPT) 10 MG tablet Take 1 tablet (10 mg total) by mouth at bedtime. 02/14/22  Yes Stacks, Cletus Gash, MD  empagliflozin (JARDIANCE) 10 MG TABS tablet Take 1 tablet (10 mg total) by mouth daily. 02/14/22  Yes Claretta Fraise, MD  famotidine (PEPCID) 20 MG tablet Take 1 tablet (20 mg total) by mouth daily. 10/11/21  Yes Stacks, Cletus Gash, MD  furosemide (LASIX) 40 MG tablet TAKE 1 TABLET EVERY DAY AS NEEDED  (INCREASED  WEIGHT  GAIN OR SWELLING) 11/14/21  Yes Stacks, Cletus Gash, MD  JANUVIA 100 MG tablet TAKE 1 TABLET EVERY DAY 10/24/21  Yes Claretta Fraise, MD  levothyroxine (SYNTHROID) 137 MCG tablet TAKE 1 TABLET (137 MCG TOTAL) BY MOUTH DAILY. REPLACES [place]. 11/14/21  Yes Stacks, Cletus Gash,  MD  linaclotide Rolan Lipa) 72 MCG capsule Take 1 capsule (72 mcg total) by mouth daily before breakfast. To keep bowels soft and regular 02/14/22  Yes Stacks, Cletus Gash, MD  losartan (COZAAR) 50 MG tablet TAKE 1 TABLET EVERY DAY 02/13/22  Yes Stacks, Cletus Gash, MD  memantine (NAMENDA) 5 MG tablet TAKE 1 TABLET TWICE DAILY 05/08/22  Yes Stacks, Cletus Gash, MD  nitroGLYCERIN (NITROSTAT) 0.4 MG SL tablet DISSOLVE 1 TABLET UNDER TONGUE EVERY 5 MINUTES AS NEEDED FOR CHEST PAIN UP TO 3 DOSES 12/14/20  Yes Claretta Fraise, MD  tiotropium (SPIRIVA HANDIHALER) 18 MCG inhalation capsule Place 1 capsule (18 mcg total) into inhaler and inhale daily. 02/21/22  Yes Stacks, Cletus Gash, MD  glucose blood (ACCU-CHEK GUIDE) test strip TEST BLOOD SUGAR TWICE DAILY AS DIRECTED Dx E11.9 02/28/22   Claretta Fraise, MD      Allergies    Patient has no known allergies.    Review of Systems   Review of Systems  Unable to perform ROS: Mental status change    Physical Exam Updated Vital Signs BP 96/65   Pulse 93   Temp 98.8 F (37.1 C) (Axillary)   Resp 14   SpO2 97%  Physical Exam Constitutional:      General: He is not in acute distress.    Appearance: He is ill-appearing. He is not toxic-appearing or diaphoretic.  HENT:     Head: Normocephalic and atraumatic.     Right  Ear: External ear normal.     Left Ear: External ear normal.     Nose: Nose normal.     Mouth/Throat:     Mouth: Mucous membranes are moist.  Eyes:     Comments: Right gaze deviation  Cardiovascular:     Rate and Rhythm: Normal rate and regular rhythm.     Heart sounds: No murmur heard. Pulmonary:     Effort: Pulmonary effort is normal. No respiratory distress.     Breath sounds: Normal breath sounds. No stridor. No wheezing, rhonchi or rales.  Abdominal:     General: There is no distension.     Palpations: Abdomen is soft.     Tenderness: There is no abdominal tenderness.  Musculoskeletal:        General: No deformity.     Cervical back: Neck  supple.     Right lower leg: No edema.     Left lower leg: No edema.  Skin:    General: Skin is warm and dry.  Neurological:     Cranial Nerves: Dysarthria and facial asymmetry present.     Motor: Weakness (Left hemibody) present.     ED Results / Procedures / Treatments   Labs (all labs ordered are listed, but only abnormal results are displayed) Labs Reviewed  CBC - Abnormal; Notable for the following components:      Result Value   WBC 10.8 (*)    Hemoglobin 17.1 (*)    All other components within normal limits  DIFFERENTIAL - Abnormal; Notable for the following components:   Neutro Abs 9.9 (*)    Lymphs Abs 0.3 (*)    All other components within normal limits  COMPREHENSIVE METABOLIC PANEL - Abnormal; Notable for the following components:   Glucose, Bld 282 (*)    BUN 24 (*)    Total Bilirubin 1.5 (*)    GFR, Estimated 55 (*)    Anion gap 16 (*)    All other components within normal limits  I-STAT CHEM 8, ED - Abnormal; Notable for the following components:   BUN 26 (*)    Glucose, Bld 271 (*)    Hemoglobin 18.4 (*)    HCT 54.0 (*)    All other components within normal limits  ETHANOL  PROTIME-INR  APTT  RAPID URINE DRUG SCREEN, HOSP PERFORMED  URINALYSIS, ROUTINE W REFLEX MICROSCOPIC    EKG EKG Interpretation  Date/Time:  Monday May 08 2022 13:24:57 EST Ventricular Rate:  108 PR Interval:    QRS Duration: 95 QT Interval:  368 QTC Calculation: 441 R Axis:   70 Text Interpretation: Atrial fibrillation Anteroseptal infarct, age indeterminate Confirmed by Godfrey Pick [phone]) on 05/08/2022 4:04:47 PM  Radiology CT HEAD CODE STROKE WO CONTRAST  Result Date: 05/08/2022 CLINICAL DATA:  Code stroke.  Left-sided weakness EXAM: CT ANGIOGRAPHY HEAD AND NECK CT PERFUSION BRAIN TECHNIQUE: Multidetector CT imaging of the head and neck was performed using the standard protocol during bolus administration of intravenous contrast. Multiplanar CT image reconstructions  and MIPs were obtained to evaluate the vascular anatomy. Carotid stenosis measurements (when applicable) are obtained utilizing NASCET criteria, using the distal internal carotid diameter as the denominator. Multiphase CT imaging of the brain was performed following IV bolus contrast injection. Subsequent parametric perfusion maps were calculated using RAPID software. RADIATION DOSE REDUCTION: This exam was performed according to the departmental dose-optimization program which includes automated exposure control, adjustment of the mA and/or kV according to patient size and/or use of iterative  reconstruction technique. CONTRAST:  128m OMNIPAQUE IOHEXOL 350 MG/ML SOLN COMPARISON:  CT head 04/22/2012 FINDINGS: CT HEAD FINDINGS Brain: There is hypodensity with loss of gray-white differentiation in the right MCA distribution involving the internal capsule and lentiform nucleus, external capsule/insular cortex, and frontal operculum. ASPECTS is 5. There is no acute intracranial hemorrhage or extra-axial fluid collection. Background parenchymal volume is normal. The ventricles are normal in size. There is no solid mass lesion. There is no mass effect or midline shift. Vascular: There is hyperdensity in the right sylvian fissure likely reflecting thrombus (2-12). Skull: Normal. Negative for fracture or focal lesion. Sinuses/Orbits: There is complete opacification of the left maxillary sinus and partial opacification of the right maxillary sinus with surrounding hyperostosis. There is layering fluid on the right. Bilateral lens implants are in place. The globes and orbits are otherwise unremarkable. Other: None. Review of the MIP images confirms the above findings CTA NECK FINDINGS Aortic arch: There is calcified plaque in the aortic arch. The origins of the major branch vessels are patent. There is moderate to severe stenosis of the right subclavian artery after the origin of the common carotid artery. The left subclavian  artery is patent to the level imaged. Right carotid system: The right common carotid artery is occluded just after its origin. The internal carotid artery remains occluded throughout the neck. There is diminutive reconstitution of flow in the external carotid artery. Left carotid system: The left common carotid artery is patent. The internal carotid artery is occluded from just after the bifurcation throughout the remainder of the neck. The external carotid artery is patent with severe stenosis of the origin. Vertebral arteries: There is mild stenosis of the origin of the right vertebral artery. The right vertebral artery is otherwise patent, without hemodynamically significant stenosis or occlusion. The left vertebral artery is patent with mild calcified plaque proximally but no hemodynamically significant stenosis or occlusion. There is no dissection or aneurysm. Skeleton: There is advanced multilevel disc space narrowing and degenerative endplate change throughout the cervical spine. There is no acute osseous abnormality or suspicious osseous lesion. There is no visible canal hematoma. Other neck: The soft tissues of the neck are unremarkable. Upper chest: The imaged lung apices are clear. Review of the MIP images confirms the above findings CTA HEAD FINDINGS Anterior circulation: The right intracranial ICA remains occluded through the communicating segment. There is thrombus in the communicating segment of the ICA extending to the right M1 segment which is nearly occlusive. The superior right M2 division is occluded. There is reconstitution of flow in the smaller inferior M2 branches, and reconstitution of flow in the superior M2 branch in the sylvian fissure (8-103, 10-68). Thrombus also extends into the right A1 segment for a short segment (8-104). The left intracranial ICA is occluded through the paraclinoid segment where there is reconstitution of flow. The left MCA is patent The ACAs are otherwise patent,  without other hemodynamically significant stenosis or occlusion. The anterior communicating artery is normal. There is an anteriorly directed outpouching arising from the anterior communicating artery measuring proximally 3 mm which may reflect a fenestration, though aneurysm is not excluded. Posterior circulation: The bilateral V4 segments are patent. The basilar artery is patent. The major cerebellar arteries appear patent. The PCAs are patent, without proximal stenosis or occlusion. Bilateral posterior communicating arteries are identified. There is no aneurysm or AVM. Venous sinuses: Not well evaluated due to bolus timing. Anatomic variants: None. Review of the MIP images confirms the above findings  CT Brain Perfusion Findings: ASPECTS: 5 CBF (<30%) Volume: 24m Perfusion (Tmax>6.0s) volume: 4738mMismatch Volume: 39767mnfarction Location:Right MCA territory. Note that the reported 470 cc of ischemic brain encompasses large portions of the bilateral cerebral hemispheres. IMPRESSION: 1. Acute right MCA infarct.  Aspects is 5. 2. Occluded right common and internal carotid arteries throughout the neck with nearly occlusive thrombus extending to the right M1 segment, and occlusive thrombus in the superior right M2 branch with reconstitution of flow distally in the sylvian fissure. Thrombus also extends for short-segment into the right A1 segment. 3. Occluded left internal carotid artery from the bifurcation throughout the communicating segment with reconstitution of flow in the MCA likely via collateral flow. 4. 73 cc infarct core identified in the right MCA distribution. 470 cc of ischemic brain is identified in both cerebral hemispheres likely related to bilateral internal carotid artery occlusion. 5. 3 mm outpouching arising from the anterior communicating artery may reflect a fenestration though a small aneurysm is not excluded. 6. Moderate to severe stenosis of the right subclavian artery. 7. Chronic bilateral  maxillary sinus with near-complete opacification of the left and layering fluid on the right which could reflect superimposed acute sinusitis in the correct clinical setting. Findings of the noncontrast head CT and CTA were discussed with Dr. DicScot Dock 12:58 pm. Electronically Signed   By: PetValetta MoleD.   On: 05/08/2022 13:14   CT ANGIO HEAD NECK W WO CM W PERF (CODE STROKE)  Result Date: 05/08/2022 CLINICAL DATA:  Code stroke.  Left-sided weakness EXAM: CT ANGIOGRAPHY HEAD AND NECK CT PERFUSION BRAIN TECHNIQUE: Multidetector CT imaging of the head and neck was performed using the standard protocol during bolus administration of intravenous contrast. Multiplanar CT image reconstructions and MIPs were obtained to evaluate the vascular anatomy. Carotid stenosis measurements (when applicable) are obtained utilizing NASCET criteria, using the distal internal carotid diameter as the denominator. Multiphase CT imaging of the brain was performed following IV bolus contrast injection. Subsequent parametric perfusion maps were calculated using RAPID software. RADIATION DOSE REDUCTION: This exam was performed according to the departmental dose-optimization program which includes automated exposure control, adjustment of the mA and/or kV according to patient size and/or use of iterative reconstruction technique. CONTRAST:  100m46mNIPAQUE IOHEXOL 350 MG/ML SOLN COMPARISON:  CT head 04/22/2012 FINDINGS: CT HEAD FINDINGS Brain: There is hypodensity with loss of gray-white differentiation in the right MCA distribution involving the internal capsule and lentiform nucleus, external capsule/insular cortex, and frontal operculum. ASPECTS is 5. There is no acute intracranial hemorrhage or extra-axial fluid collection. Background parenchymal volume is normal. The ventricles are normal in size. There is no solid mass lesion. There is no mass effect or midline shift. Vascular: There is hyperdensity in the right sylvian fissure  likely reflecting thrombus (2-12). Skull: Normal. Negative for fracture or focal lesion. Sinuses/Orbits: There is complete opacification of the left maxillary sinus and partial opacification of the right maxillary sinus with surrounding hyperostosis. There is layering fluid on the right. Bilateral lens implants are in place. The globes and orbits are otherwise unremarkable. Other: None. Review of the MIP images confirms the above findings CTA NECK FINDINGS Aortic arch: There is calcified plaque in the aortic arch. The origins of the major branch vessels are patent. There is moderate to severe stenosis of the right subclavian artery after the origin of the common carotid artery. The left subclavian artery is patent to the level imaged. Right carotid system: The right common carotid artery is  occluded just after its origin. The internal carotid artery remains occluded throughout the neck. There is diminutive reconstitution of flow in the external carotid artery. Left carotid system: The left common carotid artery is patent. The internal carotid artery is occluded from just after the bifurcation throughout the remainder of the neck. The external carotid artery is patent with severe stenosis of the origin. Vertebral arteries: There is mild stenosis of the origin of the right vertebral artery. The right vertebral artery is otherwise patent, without hemodynamically significant stenosis or occlusion. The left vertebral artery is patent with mild calcified plaque proximally but no hemodynamically significant stenosis or occlusion. There is no dissection or aneurysm. Skeleton: There is advanced multilevel disc space narrowing and degenerative endplate change throughout the cervical spine. There is no acute osseous abnormality or suspicious osseous lesion. There is no visible canal hematoma. Other neck: The soft tissues of the neck are unremarkable. Upper chest: The imaged lung apices are clear. Review of the MIP images  confirms the above findings CTA HEAD FINDINGS Anterior circulation: The right intracranial ICA remains occluded through the communicating segment. There is thrombus in the communicating segment of the ICA extending to the right M1 segment which is nearly occlusive. The superior right M2 division is occluded. There is reconstitution of flow in the smaller inferior M2 branches, and reconstitution of flow in the superior M2 branch in the sylvian fissure (8-103, 10-68). Thrombus also extends into the right A1 segment for a short segment (8-104). The left intracranial ICA is occluded through the paraclinoid segment where there is reconstitution of flow. The left MCA is patent The ACAs are otherwise patent, without other hemodynamically significant stenosis or occlusion. The anterior communicating artery is normal. There is an anteriorly directed outpouching arising from the anterior communicating artery measuring proximally 3 mm which may reflect a fenestration, though aneurysm is not excluded. Posterior circulation: The bilateral V4 segments are patent. The basilar artery is patent. The major cerebellar arteries appear patent. The PCAs are patent, without proximal stenosis or occlusion. Bilateral posterior communicating arteries are identified. There is no aneurysm or AVM. Venous sinuses: Not well evaluated due to bolus timing. Anatomic variants: None. Review of the MIP images confirms the above findings CT Brain Perfusion Findings: ASPECTS: 5 CBF (<30%) Volume: 13m Perfusion (Tmax>6.0s) volume: 4752mMismatch Volume: 39761mnfarction Location:Right MCA territory. Note that the reported 470 cc of ischemic brain encompasses large portions of the bilateral cerebral hemispheres. IMPRESSION: 1. Acute right MCA infarct.  Aspects is 5. 2. Occluded right common and internal carotid arteries throughout the neck with nearly occlusive thrombus extending to the right M1 segment, and occlusive thrombus in the superior right M2  branch with reconstitution of flow distally in the sylvian fissure. Thrombus also extends for short-segment into the right A1 segment. 3. Occluded left internal carotid artery from the bifurcation throughout the communicating segment with reconstitution of flow in the MCA likely via collateral flow. 4. 73 cc infarct core identified in the right MCA distribution. 470 cc of ischemic brain is identified in both cerebral hemispheres likely related to bilateral internal carotid artery occlusion. 5. 3 mm outpouching arising from the anterior communicating artery may reflect a fenestration though a small aneurysm is not excluded. 6. Moderate to severe stenosis of the right subclavian artery. 7. Chronic bilateral maxillary sinus with near-complete opacification of the left and layering fluid on the right which could reflect superimposed acute sinusitis in the correct clinical setting. Findings of the noncontrast head CT and  CTA were discussed with Dr. Scot Dock at 12:58 pm. Electronically Signed   By: Valetta Mole M.D.   On: 05/08/2022 13:14    Procedures Procedures    Medications Ordered in ED Medications  lactated ringers infusion (has no administration in time range)  iohexol (OMNIPAQUE) 350 MG/ML injection 100 mL (100 mLs Intravenous Contrast Given 05/08/22 1251)  lactated ringers bolus 500 mL (500 mLs Intravenous New Bag/Given 05/08/22 1608)    ED Course/ Medical Decision Making/ A&P                           Medical Decision Making Amount and/or Complexity of Data Reviewed Labs: ordered. Radiology: ordered.  Risk Decision regarding hospitalization.   This patient presents to the ED for concern of strokelike symptoms, this involves an extensive number of treatment options, and is a complaint that carries with it a high risk of complications and morbidity.  The differential diagnosis includes CVA, ICH, seizure   Co morbidities that complicate the patient evaluation  DM, HLD, HTN, CAD, CHF,  neuropathy, GERD, hypothyroidism, COPD   Additional history obtained:  Additional history obtained from EMS External records from outside source obtained and reviewed including EMR   Lab Tests:  I Ordered, and personally interpreted labs.  The pertinent results include: Mild leukocytosis with neutrophilia, moderate hyperglycemia, mild elevation in bilirubin, slight anion gap   Imaging Studies ordered:  I ordered imaging studies including CT head I independently visualized and interpreted imaging which showed acute right MCA infarct, chronic bilateral ICA occlusions I agree with the radiologist interpretation   Cardiac Monitoring: / EKG:  The patient was maintained on a cardiac monitor.  I personally viewed and interpreted the cardiac monitored which showed an underlying rhythm of: Atrial fibrillation   Consultations Obtained:  I requested consultation with the neurologist, Dr. Leonel Ramsay,  and discussed lab and imaging findings as well as pertinent plan - they recommend: Aspirin monotherapy, admit to [facility] for further stroke workup.   Problem List / ED Course / Critical interventions / Medication management  Patient is a 88 year old male who reportedly lives independently he was found down shortly prior to arrival in his home.  He was reportedly last seen normal at 4 PM yesterday.  On arrival in the ED, patient has right gaze deviation, unintelligible speech, facility of left hemibody.  Findings are concerning for acute stroke or ICH.  Code stroke was initiated.  Patient's glucose was found to be moderately elevated.  Patient was taken directly to [facility].  CT scan did show an acute right MCA infarct.  He was also found to have bilateral ICA occlusions.  This was discussed with neurologist on-call who recommends admission to [facility].  This is in anticipation of possible swelling and decompensation.  Given the size of the stroke, patient to be on aspirin monotherapy only.   He is not a candidate for acute interventions.  Patient's son, Sean Hartman, was updated over telephone.  He stated that his father would not want CPR or intubation.  Patient was admitted to hospitalist for further management. I ordered medication including IV fluids for hydration Reevaluation of the patient after these medicines showed that the patient stayed the same I have reviewed the patients home medicines and have made adjustments as needed   Social Determinants of Health:  Lives independently  CRITICAL CARE Performed by: Godfrey Pick   Total critical care time: 32 minutes  Critical care time was  exclusive of separately billable procedures and treating other patients.  Critical care was necessary to treat or prevent imminent or life-threatening deterioration.  Critical care was time spent personally by me on the following activities: development of treatment plan with patient and/or surrogate as well as nursing, discussions with consultants, evaluation of patient's response to treatment, examination of patient, obtaining history from patient or surrogate, ordering and performing treatments and interventions, ordering and review of laboratory studies, ordering and review of radiographic studies, pulse oximetry and re-evaluation of patient's condition.        Final Clinical Impression(s) / ED Diagnoses Final diagnoses:  Cerebrovascular accident (CVA) due to occlusion of right middle cerebral artery [facility])    Rx / DC Orders ED Discharge Orders     None         Godfrey Pick, MD 05/08/22 1612

## 2022-05-08 NOTE — H&P (Signed)
History and Physical    Patient: Sean Hartman FVC:[id number] DOB: 20-Sep-1922 DOA: 05/08/2022 DOS: the patient was seen and examined on 05/08/2022 PCP: Claretta Fraise, MD  Patient coming from: Home  Chief Complaint:  Chief Complaint  Patient presents with   Code Stroke   HPI: Sean Hartman is a 88 year old male with a history of hypertension, hyperlipidemia, diabetes mellitus type 2, coronary disease, COPD, hypothyroidism was found down in the morning of 05/08/2022.  At baseline, the patient has cognitive impairment, but is able to perform his activities of daily living including cooking, bathing, and clothing.  He lives by himself.  Apparently, the patient's last known stated normal was around 6 PM on 05/07/2022.  On the morning of 05/08/2022, the patient's son was trying to repeatedly called the patient with no answer.  He sent a neighbor to go check on the patient.  The patient was found on the floor stuck between the bed and the wall.  EMS was activated.  Code stroke was activated in route.  The patient was noted to have slurred speech with right gaze preference. At the time of my evaluation, the patient is unable to provide any history.  He arouses to protopathic stimuli but does not follow command nor cannot speak.   In the ED, the patient was afebrile but hemodynamically stable albeit with soft blood pressures.  WBC 10.8, hemoglobin 17.1, platelets 176,000.  Sodium 136, potassium 4.1, bicarbonate 22, serum creatinine 1.20.  LFTs were unremarkable.  CTA head neck showed an acute right MCA infarct.  There is an occluded right common carotid and right internal carotid artery.  There was an occlusion of the left ICA also.  There is a 73 cc core infarct in the right MCA distribution. Neurology was consulted and did not feel the patient was a tPA candidate.  Did not feel the patient was candidate for any aggressive intervention.  Transfer to [facility] was requested for further evaluation and  treatment.   Review of Systems: As mentioned in the history of present illness. All other systems reviewed and are negative. Past Medical History:  Diagnosis Date   Anemia    fe deficient   CAD (coronary artery disease)    status post anterior wall MI November 2010   Carotid stenosis    with chronic occlusion of LICA and moderate RICA stenosis   Colon cancer (HCC)    DM2 (diabetes mellitus, type 2) (HCC)    Essential hypertension    HLD (hyperlipidemia)    Ischemic cardiomyopathy    Status post partial colectomy 11/19/2015   Past Surgical History:  Procedure Laterality Date   25 GAUGE PARS PLANA VITRECTOMY WITH 20 GAUGE MVR PORT Left 03/20/2017   25 GAUGE PARS PLANA VITRECTOMY WITH 20 GAUGE MVR PORT Left 03/20/2017   Procedure: 25 GAUGE PARS PLANA VITRECTOMY WITH 20 GAUGE MVR PORT;  Surgeon: Hayden Pedro, MD;  Location: [facility];  Service: Ophthalmology;  Laterality: Left;   CATARACT EXTRACTION W/ INTRAOCULAR LENS  IMPLANT, BILATERAL     COLECTOMY     CORONARY ANGIOPLASTY WITH STENT PLACEMENT     percutaneous transluminal coronary angioplasty and bare-metal stent to left anterior descending   INGUINAL HERNIA REPAIR Left    Social History:  reports that he quit smoking about 23 years ago. His smoking use included cigarettes. He has never used smokeless tobacco. He reports that he does not drink alcohol and does not use drugs.  No Known Allergies  Family History  Problem Relation Age of Onset   Hypertension Father     Prior to Admission medications   Medication Sig Start Date End Date Taking? Authorizing Provider  amLODipine (NORVASC) 2.5 MG tablet TAKE 1 TABLET EVERY DAY Patient taking differently: Take 2.5 mg by mouth daily. 02/13/22  Yes Claretta Fraise, MD  aspirin 81 MG EC tablet Take 81 mg by mouth daily.   Yes [provider]  carvedilol (COREG) 6.25 MG tablet Take 1 tablet (6.25 mg total) by mouth 2 (two) times daily with a meal. 02/14/22  Yes Claretta Fraise,  MD  donepezil (ARICEPT) 10 MG tablet Take 1 tablet (10 mg total) by mouth at bedtime. 02/14/22  Yes Stacks, Cletus Gash, MD  empagliflozin (JARDIANCE) 10 MG TABS tablet Take 1 tablet (10 mg total) by mouth daily. 02/14/22  Yes Claretta Fraise, MD  famotidine (PEPCID) 20 MG tablet Take 1 tablet (20 mg total) by mouth daily. 10/11/21  Yes Stacks, Cletus Gash, MD  furosemide (LASIX) 40 MG tablet TAKE 1 TABLET EVERY DAY AS NEEDED  (INCREASED  WEIGHT  GAIN OR SWELLING) 11/14/21  Yes Stacks, Cletus Gash, MD  JANUVIA 100 MG tablet TAKE 1 TABLET EVERY DAY 10/24/21  Yes Claretta Fraise, MD  levothyroxine (SYNTHROID) 137 MCG tablet TAKE 1 TABLET (137 MCG TOTAL) BY MOUTH DAILY. REPLACES Nathalie. 11/14/21  Yes Stacks, Cletus Gash, MD  linaclotide Rolan Lipa) 72 MCG capsule Take 1 capsule (72 mcg total) by mouth daily before breakfast. To keep bowels soft and regular 02/14/22  Yes Stacks, Cletus Gash, MD  losartan (COZAAR) 50 MG tablet TAKE 1 TABLET EVERY DAY 02/13/22  Yes Stacks, Cletus Gash, MD  memantine (NAMENDA) 5 MG tablet TAKE 1 TABLET TWICE DAILY 05/08/22  Yes Stacks, Cletus Gash, MD  nitroGLYCERIN (NITROSTAT) 0.4 MG SL tablet DISSOLVE 1 TABLET UNDER TONGUE EVERY 5 MINUTES AS NEEDED FOR CHEST PAIN UP TO 3 DOSES 12/14/20  Yes Claretta Fraise, MD  tiotropium (SPIRIVA HANDIHALER) 18 MCG inhalation capsule Place 1 capsule (18 mcg total) into inhaler and inhale daily. 02/21/22  Yes Stacks, Cletus Gash, MD  glucose blood (ACCU-CHEK GUIDE) test strip TEST BLOOD SUGAR TWICE DAILY AS DIRECTED Dx E11.9 02/28/22   Claretta Fraise, MD    Physical Exam: Vitals:   05/08/22 1348 05/08/22 1400 05/08/22 1430 05/08/22 1500  BP:  (!) 116/95 (!) 125/90 96/65  Pulse:  92 100 93  Resp:  (!) 37 (!) 31 14  Temp: 98.8 F (37.1 C)     TempSrc: Axillary     SpO2:  96% 97% 97%   GENERAL:  Awakens with protopathic stimuli.   NAD, well developed, cooperative, does not follows commands HEENT: [place]/AT, No thrush, No icterus, No oral ulcers Neck:  No neck mass, No meningismus,  soft, supple CV: RRR, no S3, no S4, no rub, no JVD Lungs:  CTA, no wheeze, no rhonchi, good air movement Abd: soft/NT +BS, nondistended Ext: No edema, no lymphangitis, no cyanosis, no rashes Neuro:  strength 4/5 in RUE, RLE, strength 0/5 LUE, LLE; sensation intact bilateral; no dysmetria; babinski equivocal  Data Reviewed: Data reviewed above in history Assessment and Plan: -Appreciate Neurology Consult -PT/OT evaluation -Speech therapy eval -CTA brain-, H&N-acute right MCA infarct, occluded right common carotid and right internal carotid arteries.  Occluded left ICA.  73 cc core infarct in the right MCA distribution. -Echo-- -LDL-- -HbA1C-- -Antiplatelet--ASA -seen by Dr. Dava Najjar to [facility] for further eval  CKD stage IIIa -Baseline creatinine 0.9-1.2  New onset Afib -personally reviewed EKG--afib, nonspecific T wave change -  not candidate for [facility] presently given large size of stroke  Uncontrolled diabetes mellitus type 2 with hyperglycemia -02/14/2021 hemoglobin A1c 8.1 -NovoLog sliding scale -Holding Jardiance  Hypothyroidism -Convert Synthroid to IV temporarily  Essential hypertension -Holding amlodipine, losartan, carvedilol for permissive hypertension  Mixed hyperlipidemia -Start statin if able to take p.o.  COPD -Stable on room air -Previously on Spiriva inhaler  Cognitive impairment -Restart Aricept and Namenda when able to take p.o.   Advance Care Planning: DNR  Consults: neurology  Family Communication: son updated--confirms DNR  Severity of Illness: The appropriate patient status for this patient is INPATIENT. Inpatient status is judged to be reasonable and necessary in order to provide the required intensity of service to ensure the patient's safety. The patient's presenting symptoms, physical exam findings, and initial radiographic and laboratory data in the context of their chronic comorbidities is felt to place them at high risk for  further clinical deterioration. Furthermore, it is not anticipated that the patient will be medically stable for discharge from the hospital within 2 midnights of admission.   * I certify that at the point of admission it is my clinical judgment that the patient will require inpatient hospital care spanning beyond 2 midnights from the point of admission due to high intensity of service, high risk for further deterioration and high frequency of surveillance required.*  Author: Orson Eva, MD 05/08/2022 3:29 PM  For on call review [email or web address].

## 2022-05-08 NOTE — ED Notes (Signed)
CODE STROKE paged out prior to arrival called by EMS in field.

## 2022-05-09 ENCOUNTER — Other Ambulatory Visit (HOSPITAL_COMMUNITY): Payer: Medicare HMO

## 2022-05-09 ENCOUNTER — Inpatient Hospital Stay (HOSPITAL_COMMUNITY): Payer: Medicare HMO

## 2022-05-09 DIAGNOSIS — I639 Cerebral infarction, unspecified: Secondary | ICD-10-CM | POA: Diagnosis present

## 2022-05-09 DIAGNOSIS — Z7189 Other specified counseling: Secondary | ICD-10-CM | POA: Diagnosis not present

## 2022-05-09 DIAGNOSIS — I63511 Cerebral infarction due to unspecified occlusion or stenosis of right middle cerebral artery: Secondary | ICD-10-CM | POA: Diagnosis not present

## 2022-05-09 LAB — URINALYSIS, ROUTINE W REFLEX MICROSCOPIC
Bilirubin Urine: NEGATIVE
Glucose, UA: >=500 mg/dL — AB
Ketones, ur: 20 mg/dL — AB
Nitrite: NEGATIVE
Protein, ur: 100 mg/dL — AB
Specific Gravity, Urine: >1.046 — ABNORMAL HIGH (ref 1.005–1.030)
WBC, UA: >50 WBC/hpf — ABNORMAL HIGH (ref 0–5)
pH: 5 (ref 5.0–8.0)

## 2022-05-09 LAB — CBG MONITORING, ED
Glucose-Capillary: 227 mg/dL — ABNORMAL HIGH (ref 70–99)
Glucose-Capillary: 212 mg/dL — ABNORMAL HIGH (ref 70–99)
Glucose-Capillary: 141 mg/dL — ABNORMAL HIGH (ref 70–99)
Glucose-Capillary: 160 mg/dL — ABNORMAL HIGH (ref 70–99)

## 2022-05-09 LAB — LIPID PANEL
Cholesterol: 173 mg/dL (ref 0–200)
HDL: 54 mg/dL (ref >40)
LDL Cholesterol: 108 mg/dL — ABNORMAL HIGH (ref 0–99)
Total CHOL/HDL Ratio: 3.2 RATIO
Triglycerides: 56 mg/dL (ref <150)
VLDL: 11 mg/dL (ref 0–40)

## 2022-05-09 LAB — RAPID URINE DRUG SCREEN, HOSP PERFORMED
Amphetamines: NOT DETECTED
Barbiturates: NOT DETECTED
Benzodiazepines: NOT DETECTED
Cocaine: NOT DETECTED
Opiates: NOT DETECTED
Tetrahydrocannabinol: NOT DETECTED

## 2022-05-09 MED ORDER — HALOPERIDOL LACTATE 2 MG/ML PO CONC: 0.5000 mg | ORAL | Status: DC | PRN

## 2022-05-09 MED ORDER — HALOPERIDOL 0.5 MG PO TABS: 0.5000 mg | ORAL_TABLET | ORAL | Status: DC | PRN

## 2022-05-09 MED ORDER — HALOPERIDOL LACTATE 5 MG/ML IJ SOLN: 0.5000 mg | INTRAMUSCULAR | Status: DC | PRN

## 2022-05-09 MED ORDER — MORPHINE SULFATE (PF) 2 MG/ML IV SOLN
1.0000 mg | INTRAVENOUS | Status: DC | PRN
  Administered 2022-05-10 (×3): 1 mg via INTRAVENOUS
  Filled 2022-05-09 (×3): qty 1

## 2022-05-09 MED ORDER — LORAZEPAM 2 MG/ML PO CONC: 1.0000 mg | ORAL | Status: DC | PRN

## 2022-05-09 MED ORDER — ONDANSETRON 4 MG PO TBDP: 4.0000 mg | ORAL_TABLET | Freq: Four times a day (QID) | ORAL | Status: DC | PRN

## 2022-05-09 MED ORDER — GLYCOPYRROLATE 0.2 MG/ML IJ SOLN: 0.2000 mg | INTRAMUSCULAR | Status: DC | PRN

## 2022-05-09 MED ORDER — LORAZEPAM 1 MG PO TABS: 1.0000 mg | ORAL_TABLET | ORAL | Status: DC | PRN

## 2022-05-09 MED ORDER — POLYVINYL ALCOHOL 1.4 % OP SOLN: 1.0000 [drp] | Freq: Four times a day (QID) | OPHTHALMIC | Status: DC | PRN

## 2022-05-09 MED ORDER — ONDANSETRON HCL 4 MG/2ML IJ SOLN: 4.0000 mg | Freq: Four times a day (QID) | INTRAMUSCULAR | Status: DC | PRN

## 2022-05-09 MED ORDER — GLYCOPYRROLATE 0.2 MG/ML IJ SOLN
0.2000 mg | INTRAMUSCULAR | Status: DC | PRN
  Administered 2022-05-09: 0.2 mg via INTRAVENOUS
  Filled 2022-05-09: qty 1

## 2022-05-09 MED ORDER — LEVOTHYROXINE SODIUM 100 MCG/5ML IV SOLN: 100.0000 ug | Freq: Every day | INTRAVENOUS | Status: DC

## 2022-05-09 MED ORDER — GLYCOPYRROLATE 1 MG PO TABS: 1.0000 mg | ORAL_TABLET | ORAL | Status: DC | PRN

## 2022-05-09 MED ORDER — LORAZEPAM 2 MG/ML IJ SOLN
1.0000 mg | INTRAMUSCULAR | Status: DC | PRN
  Administered 2022-05-10: 1 mg via INTRAVENOUS
  Filled 2022-05-09: qty 1

## 2022-05-09 NOTE — ED Notes (Signed)
MD notified that the pts family is at the bedside

## 2022-05-09 NOTE — ED Notes (Signed)
Checked pt blood sugar but did not cross over . Blood sugar was 141

## 2022-05-09 NOTE — Progress Notes (Signed)
Neurology Progress Note   S:// Seen and examined Atrial fibrillation on the monitor.  I am guessing this is a new diagnosis  O:// Current vital signs: BP (!) 163/82   Pulse 86   Temp 99.8 F (37.7 C) (Rectal)   Resp 20   Ht '5\' 10"'$  (1.778 m)   Wt 69.2 kg   SpO2 96%   BMI 21.89 kg/m  Vital signs in last 24 hours: Temp:  [98.8 F (37.1 C)-99.8 F (37.7 C)] 99.8 F (37.7 C) (12/05 1058) Pulse Rate:  [65-105] 86 (12/05 1030) Resp:  [13-37] 20 (12/05 1030) BP: (96-165)/(65-137) 163/82 (12/05 1030) SpO2:  [95 %-100 %] 96 % (12/05 1030) Weight:  [69.2 kg] 69.2 kg (12/04 2033) Gen: sleeping in bed, opens eyes to voice HEENT: [place] AT MMM CVS: irregularly irregular Resp: breathing well and saturating well on RA Abd: ND NT  Ext: warm well perfused Neurological exam Sleeping Awakens to voice Does not follow all commands consistently Mumbles incomprehensibly CN: PERRL, EOM shows right gaze preference, no blink to threat from left, left lower face weakness at rest, does not smile to command. Motor: flaccid LUE and LLE Sensory: diminished grimace and withdrawal to nox stim on the left Coordination: can not assess 1a Level of Conscious.: 0 1b LOC Questions: 2 1c LOC Commands: 2 2 Best Gaze: 1 3 Visual: 2 4 Facial Palsy: 2 5a Motor Arm - left: 4 5b Motor Arm - Right: 1 6a Motor Leg - Left: 3 6b Motor Leg - Right: 1 7 Limb Ataxia: 0 8 Sensory: 2 9 Best Language:3 10 Dysarthria: 2 11 Extinct. and Inatten.:2  TOTAL: 27    Medications  Current Facility-Administered Medications:     stroke: early stages of recovery book, , Does not apply, Once, Tat, David, MD   0.9 %  sodium chloride infusion, , Intravenous, Continuous, Tat, David, MD   acetaminophen (TYLENOL) tablet 650 mg, 650 mg, Oral, Q4H PRN **OR** acetaminophen (TYLENOL) 160 MG/5ML solution 650 mg, 650 mg, Per Tube, Q4H PRN **OR** acetaminophen (TYLENOL) suppository 650 mg, 650 mg, Rectal, Q4H PRN, Tat, David, MD    aspirin suppository 300 mg, 300 mg, Rectal, Daily, Tat, David, MD, 300 mg at 05/09/22 1055   heparin injection 5,000 Units, 5,000 Units, Subcutaneous, Q8H, Tat, David, MD, 5,000 Units at 05/09/22 0347   insulin aspart (novoLOG) injection 0-9 Units, 0-9 Units, Subcutaneous, Q4H, Tat, David, MD, 1 Units at 05/09/22 4259   lactated ringers infusion, , Intravenous, Continuous, Godfrey Pick, MD, Stopped at 05/09/22 0842   [START ON 05/14/2022] levothyroxine (SYNTHROID, LEVOTHROID) injection 100 mcg, 100 mcg, Intravenous, Daily, Emokpae, Courage, MD   senna-docusate (Senokot-S) tablet 1 tablet, 1 tablet, Oral, QHS PRN, Tat, David, MD  Current Outpatient Medications:    amLODipine (NORVASC) 2.5 MG tablet, TAKE 1 TABLET EVERY DAY (Patient taking differently: Take 2.5 mg by mouth daily.), Disp: 90 tablet, Rfl: 0   aspirin 81 MG EC tablet, Take 81 mg by mouth daily., Disp: , Rfl:    carvedilol (COREG) 6.25 MG tablet, Take 1 tablet (6.25 mg total) by mouth 2 (two) times daily with a meal., Disp: 180 tablet, Rfl: 3   donepezil (ARICEPT) 10 MG tablet, Take 1 tablet (10 mg total) by mouth at bedtime., Disp: 90 tablet, Rfl: 3   empagliflozin (JARDIANCE) 10 MG TABS tablet, Take 1 tablet (10 mg total) by mouth daily., Disp: 90 tablet, Rfl: 3   famotidine (PEPCID) 20 MG tablet, Take 1 tablet (20 mg total) by  mouth daily., Disp: 90 tablet, Rfl: 3   furosemide (LASIX) 40 MG tablet, TAKE 1 TABLET EVERY DAY AS NEEDED  (INCREASED  WEIGHT  GAIN OR SWELLING), Disp: 90 tablet, Rfl: 3   JANUVIA 100 MG tablet, TAKE 1 TABLET EVERY DAY, Disp: 90 tablet, Rfl: 3   levothyroxine (SYNTHROID) 137 MCG tablet, TAKE 1 TABLET (137 MCG TOTAL) BY MOUTH DAILY. REPLACES 112 MCG DOSE., Disp: 90 tablet, Rfl: 1   linaclotide (LINZESS) 72 MCG capsule, Take 1 capsule (72 mcg total) by mouth daily before breakfast. To keep bowels soft and regular, Disp: 90 capsule, Rfl: 3   losartan (COZAAR) 50 MG tablet, TAKE 1 TABLET EVERY DAY, Disp: 90 tablet,  Rfl: 0   memantine (NAMENDA) 5 MG tablet, TAKE 1 TABLET TWICE DAILY, Disp: 180 tablet, Rfl: 0   nitroGLYCERIN (NITROSTAT) 0.4 MG SL tablet, DISSOLVE 1 TABLET UNDER TONGUE EVERY 5 MINUTES AS NEEDED FOR CHEST PAIN UP TO 3 DOSES, Disp: 25 tablet, Rfl: 3   tiotropium (SPIRIVA HANDIHALER) 18 MCG inhalation capsule, Place 1 capsule (18 mcg total) into inhaler and inhale daily., Disp: 90 capsule, Rfl: 3   glucose blood (ACCU-CHEK GUIDE) test strip, TEST BLOOD SUGAR TWICE DAILY AS DIRECTED Dx E11.9, Disp: 200 strip, Rfl: 3 Labs CBC    Component Value Date/Time   WBC 10.8 (H) 05/08/2022 1228   RBC 5.26 05/08/2022 1228   HGB 18.4 (H) 05/08/2022 1241   HGB 14.6 02/14/2022 1023   HCT 54.0 (H) 05/08/2022 1241   HCT 42.9 02/14/2022 1023   PLT 176 05/08/2022 1228   PLT 157 02/14/2022 1023   MCV 96.8 05/08/2022 1228   MCV 94 02/14/2022 1023   MCH 32.5 05/08/2022 1228   MCHC 33.6 05/08/2022 1228   RDW 13.4 05/08/2022 1228   RDW 12.6 02/14/2022 1023   LYMPHSABS 0.3 (L) 05/08/2022 1228   LYMPHSABS 0.6 (L) 02/14/2022 1023   MONOABS 0.5 05/08/2022 1228   EOSABS 0.0 05/08/2022 1228   EOSABS 0.1 02/14/2022 1023   BASOSABS 0.0 05/08/2022 1228   BASOSABS 0.1 02/14/2022 1023    CMP     Component Value Date/Time   NA 138 05/08/2022 1241   NA 135 02/14/2022 1023   K 4.3 05/08/2022 1241   CL 98 05/08/2022 1241   CO2 22 05/08/2022 1228   GLUCOSE 271 (H) 05/08/2022 1241   BUN 26 (H) 05/08/2022 1241   BUN 23 02/14/2022 1023   CREATININE 1.00 05/08/2022 1241   CALCIUM 9.5 05/08/2022 1228   PROT 7.4 05/08/2022 1228   PROT 6.5 02/14/2022 1023   ALBUMIN 4.1 05/08/2022 1228   ALBUMIN 4.1 02/14/2022 1023   AST 27 05/08/2022 1228   ALT 15 05/08/2022 1228   ALKPHOS 58 05/08/2022 1228   BILITOT 1.5 (H) 05/08/2022 1228   BILITOT 0.5 02/14/2022 1023   GFRNONAA 55 (L) 05/08/2022 1228   GFRAA 57 (L) 07/23/2020 1046   Imaging I have reviewed images in epic and the results pertinent to this consultation  are: CT-scan of the brain aspects 5.  No bleed MRI examination of the brain-large right MCA territory infarction.  No significant mass effect or hemorrhage  Assessment and plan: 88 year old with extensive past medical history presenting with symptoms of right hemispheric dysfunction and a large stroke outside the window for IV thrombolysis as well as large infarct size precluded acute intervention in spite of his fairly decent functional status for his age. He is more sleepy today and NIH stroke scale remains high at 27.  Monitoring EKG showed atrial fibrillation-which I think is the etiology of stroke I had detailed discussion with the family regarding the size of the stroke and potentially poor outcomes from a neurologically meaningful recovery standpoint.  Family has decided that they want him to be comfortable and do not want any aggressive interventions made.  They had already made him DNR yesterday. I spoke with the son and daughter over the phone FaceTime.  2 grandkids were at bedside who also were in the conversation. I have relayed my plan to Dr. Denton Brick. He has requested palliative care consultation.  I appreciate palliative medicine's care of this patient. I recommend to cancel the transfer to [facility], echocardiogram and other testing as that is not likely to change any outcome here. Inpatient neurology will be available as needed.  Please call with questions.  -- Amie Portland, MD Neurologist [facility] Pager: [phone]

## 2022-05-09 NOTE — Progress Notes (Signed)
Brief Summary:-  88 year old male with a history of hypertension, hyperlipidemia, diabetes mellitus type 2, coronary disease, COPD, hypothyroidism admitted on 05/08/2022 with rather large acute stroke (MRI showed large right MCA territory infarction), CTA head and CTA Neck shows occluded right common carotid and right internal carotid arteries. Occluded left ICA. 73 cc core infarct in the right MCA distribution.    Exam--- patient is largely unresponsive, appears comfortable Lungs-sounds somewhat congested, no wheezing CV--irregularly irregular Neuro-left-sided hemiparesis especially left upper extremity -Not awake or alert enough for further neuro assessment  A/p 1)Acute Stroke--- rather large right MCA territory stroke--- detailed above in brief summary section -Patient remains largely unresponsive -Neurology consult from Dr. Leonel Ramsay on 05/08/2022 and Dr. Rory Percy on 05/09/2022 appreciated--  -Family request transition to comfort care -Palliative care consult on 05/09/2022 appreciated -TOC team reaching out to hospice to see about disposition  2) new onset atrial fibrillation----comfort care  3)DM2-comfort care  4)COPD-comfort care  5)CKD 3A---- comfort care  6) underlying dementia--- comfort care  7)HTN--- comfort care  8) hypothyroidism--- comfort care  - Await hospice consult may be transferred to residential hospice house if bed available - Comfort care orders initiated by palliative care team member - Total Care time 53 minutes including time spent coordinating care with neurology team, palliative care team and TOC  -Please see full H&P dictated by Dr. Carles Collet on 05/08/2022 -Please see neurology progress note from Dr. Rory Percy on 05/09/2022 -Please see palliative care consult note from 05/09/2022  Roxan Hockey, MD

## 2022-05-09 NOTE — ED Notes (Signed)
Patient transported to MRI 

## 2022-05-09 NOTE — Consult Note (Signed)
Consultation Note Date: 05/09/2022   Patient Name: Sean Hartman  DOB: 1923/02/15  MRN: [id number]  Age / Sex: 88 y.o., male  PCP: Claretta Fraise, MD Referring Physician: Roxan Hockey, MD  Reason for Consultation:  goals of care  HPI/Patient Profile: 88 y.o. male  with past medical history of hypertension, hyperlipidemia, DM 2, CAD, COPD, hypothyroidism admitted on 05/08/2022 with altered mental status.  He was found down in his home by his neighbor.  Work-up reveals large stroke.  Neurology consulted-NIH stroke scale at 13.  He was determined to have poor prognosis for meaningful functional recovery.  Palliative consulted for goals of care and assistance with end-of-life care.  Primary Decision Maker NEXT OF KIN  Discussion: Chart reviewed including labs imaging and progress notes from this and previous encounters.   On evaluation Sean Hartman is not responsive to my voice.  There are some sounds of accumulating secretions.  His granddaughter shares that he has woken up a few times but then quickly drifts back off to sleep.  His speech has been mostly unintelligible, but he they have been able to determine he has said a few family members names.  He has been minimally responsive since admission to the hospital. Neurology and attending MD have had goals of care discussions with family members.  I met with patient's grandson and his son by phone.  Patient's great granddaughter was also at the bedside. Prior to this admission patient was living in his home independent.  He was very popular with his friends in his apartment building. I confirmed their previous discussions with providers with the plans to transition to comfort measures only. Discussed transition to comfort measures only which includes stopping IV fluids, antibiotics, labs and providing symptom management for SOB, anxiety, nausea, vomiting,  and other symptoms of dying.  Options for hospice care were discussed.  Family would like to consult [facility] [place] for potential placement in their hospice facility for end-of-life care.    SUMMARY OF RECOMMENDATIONS -Transition to full comfort measures only -I have DC'd medications and orders that are not contributing to patient's comfort -Transition to full comfort measures 84m morphine IV q1hr prn SOB, air hunger, or any signs of discomfort Lorazepam 123mpo, SL or IV q4 hr anxiety Haldol .3m70mo, SL or IV q4 hr PRN agitation Glycopyrrolate .2mg72m q4hr secretions -Appreciate TOC assisting with [facility]: [place]   Prognosis:   < 2 weeks  Discharge Planning: Hospice facility  Primary Diagnoses: Present on Admission:  Acute right MCA stroke (HCC)Burdenhronic obstructive pulmonary disease (HCC)  Chronic diastolic heart failure (HCC)  Essential hypertension  Diabetic neuropathy with neurologic complication (HCC)  Moderate late onset Alzheimer's dementia with mood disturbance (HCC)  [facility] due to [facility]  Unable to perform ROS: Mental status change    Physical Exam Vitals and nursing note reviewed.  Constitutional:      Appearance: He is ill-appearing.  Neurological:     Comments: Unresponsive     Vital Signs: BP (!) 158/81   Pulse 67   Temp 99.8 F (37.7 C) (Rectal)   Resp 18   Ht _0  (1.778 m)   Wt 69.2 kg   SpO2 96%   BMI 21.89 kg/m          SpO2: SpO2: 96 % O2 Device:SpO2: 96 % O2 Flow Rate: .   IO: Intake/output summary: No intake or output data in the 24 hours ending 05/09/22 1252  LBM: Last BM Date :  (UTA) Baseline Weight: Weight: 69.2 kg Most recent weight: Weight: 69.2 kg       Thank you for this consult. Palliative medicine will continue to follow and assist as needed.   Greater than 50%  of this time was spent counseling and  coordinating care related to the above assessment and plan.  Signed by: Mariana Kaufman, AGNP-C Palliative Medicine    Please contact Palliative Medicine Team phone at [phone] for questions and concerns.  For individual provider: See Shea Evans

## 2022-05-09 NOTE — ED Notes (Signed)
Patient able to move right side of body. Moves left leg with painful stimuli. Left arm still flaccid.

## 2022-05-09 NOTE — TOC Progression Note (Signed)
  Transition of Care [facility]) Screening Note   Patient Details  Name: Sean Hartman Date of Birth: [birth year]   Transition of Care [facility]) CM/SW Contact:    Boneta Lucks, RN Phone Number: 05/09/2022, 11:06 AM   CVA - looking to transfer to [facility]   Transition of Care Department [facility]) has reviewed patient and no TOC needs have been identified at this time. We will continue to monitor patient advancement through interdisciplinary progression rounds. If new patient transition needs arise, please place a TOC consult.    Barriers to Discharge: Continued Medical Work up

## 2022-05-09 NOTE — ED Notes (Signed)
Rory Percy, Neuro MD at bedside updating family

## 2022-05-09 NOTE — TOC Progression Note (Addendum)
Transition of Care [facility]) - Progression Note    Patient Details  Name: Sean Hartman MRN: [id number] Date of Birth: Apr 26, 1923  Transition of Care [facility]) CM/SW Contact  Boneta Lucks, RN Phone Number: 05/09/2022, 1:45 PM  Clinical Narrative:   Palliative consulted TOC to refer patient to [facility], Family wants comfort care. Referral sent. Message sent to have them come assess.   Addendum : Hospice called they will send a nurse to come tomorrow around 11:30 to assess.   Expected Discharge Plan: [place] Barriers to Discharge: Continued Medical Work up  Expected Discharge Plan and Services Expected Discharge Plan: [place]

## 2022-05-10 DIAGNOSIS — I5032 Chronic diastolic (congestive) heart failure: Secondary | ICD-10-CM | POA: Diagnosis not present

## 2022-05-10 DIAGNOSIS — I63511 Cerebral infarction due to unspecified occlusion or stenosis of right middle cerebral artery: Secondary | ICD-10-CM | POA: Diagnosis not present

## 2022-05-10 DIAGNOSIS — Z515 Encounter for palliative care: Secondary | ICD-10-CM | POA: Diagnosis not present

## 2022-05-10 LAB — HEMOGLOBIN A1C
Hgb A1c MFr Bld: 8 % — ABNORMAL HIGH (ref 4.8–5.6)
Mean Plasma Glucose: 183 mg/dL

## 2022-05-10 MED ORDER — ORAL CARE MOUTH RINSE: 15.0000 mL | OROMUCOSAL | Status: DC | PRN

## 2022-05-10 MED ORDER — ORAL CARE MOUTH RINSE: 15.0000 mL | OROMUCOSAL | Status: DC

## 2022-05-10 NOTE — Progress Notes (Signed)
Pt has received a bed bath, shaved  pericare  was completed and condom cath replaced. This nurse also completed oral care several times this shift. Family has been bedside throughout this shift. Pt is currently waiting on transport to hospice.

## 2022-05-10 NOTE — Care Management Important Message (Signed)
Important Message  Patient Details  Name: Sean Hartman MRN: [id number] Date of Birth: [birth year]   Medicare Important Message Given:  N/A - LOS <3 / Initial given by admissions     Tommy Medal 05/10/2022, 12:20 PM

## 2022-05-10 NOTE — TOC Transition Note (Signed)
Transition of Care [facility]) - CM/SW Discharge Note   Patient Details  Name: Sean Hartman MRN: [id number] Date of Birth: 05-18-1923  Transition of [facility]) CM/SW Contact:  Boneta Lucks, RN Phone Number: 05/10/2022, 12:28 PM   Clinical Narrative:  [facility] accepted the patient has has a bed. They set up transporatation for 4 PM with EMS, MD and RN updated to discharge and call report.   Final next level of care: [place] Barriers to Discharge: Barriers Resolved   Patient Goals and CMS Choice Patient states their goals for this hospitalization and ongoing recovery are:: agreeable to Hospice CMS Medicare.gov Compare Post Acute Care list provided to:: Patient Represenative (must comment) Choice offered to / list presented to : Adult Children  Discharge Placement                       Discharge Plan and [place]  Readmission Risk Interventions    05/10/2022   12:28 PM  Readmission Risk Prevention Plan  Transportation Screening Complete  PCP or Specialist Appt within 5-7 Days Complete  Home Care Screening Complete  Medication Review (RN CM) Complete

## 2022-05-10 NOTE — Progress Notes (Signed)
Daily Progress Note   Patient Name: Sean Hartman       Date: 05/10/2022 DOB: 1922/12/13  Age: 88 y.o. MRN#: [id number] Attending Physician: Deatra Grahm, MD Primary Care Physician: Claretta Fraise, MD Admit Date: 05/08/2022  Reason for Consultation/Follow-up: Establishing goals of care  Patient Profile/HPI:  88 y.o. male  with past medical history of hypertension, hyperlipidemia, DM 2, CAD, COPD, hypothyroidism admitted on 05/08/2022 with altered mental status.  He was found down in his home by his neighbor.  Work-up reveals large stroke.  Neurology consulted-NIH stroke scale at 60.  He was determined to have poor prognosis for meaningful functional recovery.  Palliative consulted for goals of care and assistance with end-of-life care.   Subjective: Chart reviewed including labs, progress notes, imaging from this and previous encounters.  Mr. Woodfield appears comfortable. Eddie Dibbles is at bedside.  Hospice to evaluate today.  He does not respond to me, but Gerald Stabs notes there was some interaction with family last night. He is not able to eat or drink.   Review of Systems  Unable to perform ROS: Mental status change     Physical Exam Vitals and nursing note reviewed.  Constitutional:      Appearance: He is ill-appearing.  Cardiovascular:     Rate and Rhythm: Normal rate.  Pulmonary:     Effort: Pulmonary effort is normal.  Neurological:     Comments: unresponsive             Vital Signs: BP 127/80 (BP Location: Left Arm)   Pulse 100   Temp 98.4 F (36.9 C) (Axillary)   Resp 12   Ht '5\' 10"'$  (1.778 m)   Wt 69.2 kg   SpO2 97%   BMI 21.89 kg/m  SpO2: SpO2: 97 % O2 Device: O2 Device: Room Air O2 Flow Rate:    Intake/output summary:  Intake/Output Summary (Last  24 hours) at 05/10/2022 1030 Last data filed at 05/09/2022 1300 Gross per 24 hour  Intake --  Output 850 ml  Net -850 ml   LBM: Last BM Date :  (UTA) Baseline Weight: Weight: 69.2 kg Most recent weight: Weight: 69.2 kg       Palliative Assessment/Data: PPS: 10%      Patient Active Problem List   Diagnosis Date Noted   Acute stroke  due to ischemia ([place]) 05/09/2022   Acute right MCA stroke ([place]) 05/08/2022   Moderate late onset Alzheimer's dementia with mood disturbance ([place]) 10/11/2021   Hypothyroidism 10/10/2017   Chronic diastolic heart failure ([place]) 10/03/2017   Ischemic cardiomyopathy 10/03/2017   First degree heart block 10/03/2017   Retained lens material following cataract surgery of left eye 03/20/2017   Diabetic neuropathy with neurologic complication ([place]) 47/12/6149   Gastroesophageal reflux disease 07/12/2016   Chronic fatigue, unspecified 11/03/2015   Hilar mass 11/03/2015   Iron deficiency anemia 11/03/2015   CAFL (chronic airflow limitation) ([place]) 12/17/2013   Chronic obstructive pulmonary disease ([place]) 12/17/2013   DM type 2 with diabetic dyslipidemia ([place]) 09/17/2013   Hypertension associated with diabetes ([place]) 09/17/2013   Carotid artery disease ([place]) 12/27/2009   DIABETES MELLITUS, TYPE II 04/30/2009   HYPERLIPIDEMIA 04/30/2009   Essential hypertension 04/30/2009   Coronary artery disease of native artery of native heart with stable angina pectoris ([place]) 04/30/2009    Palliative Care Assessment & Plan    Assessment/Recommendations/Plan  Continue current comfort regimen Plan for hospice evaluation today- hopeful for inpatient hospice facility   Code Status: DNR  Prognosis:  < 2 weeks  Discharge Planning: Hospice facility pending hospice evaluation and bed availability  Care plan was discussed with family  Thank you for allowing the Palliative Medicine Team to assist in the care of this patient.  Greater than 50%  of this time was spent  counseling and coordinating care related to the above assessment and plan.  Mariana Kaufman, AGNP-C Palliative Medicine   Please contact Palliative Medicine Team phone at [phone] for questions and concerns.

## 2022-05-10 NOTE — Progress Notes (Signed)
Patient discharged to hospice today, transported by EMS. Paperwork placed in discharge packet. Family made aware of EMS arrival to take patient to hospice at bedside.

## 2022-05-10 NOTE — Progress Notes (Signed)
SLP Cancellation Note  Patient Details Name: TALLIN HART MRN: [id number] DOB: [birth year]   Cancelled treatment:       Reason Eval/Treat Not Completed: Fatigue/lethargy limiting ability to participate;Patient's level of consciousness;Other (comment) (Pt going to hospice later today. Hospice in room speaking with family.)  Thank you,  Genene Churn, [place]  [place] 05/10/2022, 12:02 PM

## 2022-05-10 NOTE — Progress Notes (Signed)
Patient rested comfortably throughout this shift, no evidence of pain or discomfort observed at this time.

## 2022-05-10 NOTE — Discharge Summary (Signed)
Physician Discharge Summary   Patient: Sean Hartman MRN: [id number] DOB: 07-19-1922  Admit date:     05/08/2022  Discharge date: 05/10/22  Discharge Physician: Deatra Clemence   PCP: Claretta Fraise, MD   Recommendations at discharge:   Discharge Planning: Hospice facility   Discharge Diagnoses: Principal Problem:   Acute right MCA stroke [facility]) Active Problems:   Essential hypertension   Chronic diastolic heart failure ([place])   Diabetic neuropathy with neurologic complication (HCC)   Chronic obstructive pulmonary disease (HCC)   Moderate late onset Alzheimer's dementia with mood disturbance ([place])   Acute stroke due to ischemia [facility])  Resolved Problems:   * No resolved hospital problems. *  Hospital Course: 88 year old male with a history of hypertension, hyperlipidemia, diabetes mellitus type 2, coronary disease, COPD, hypothyroidism was found down in the morning of 05/08/2022.  At baseline, the patient has cognitive impairment, but is able to perform his activities of daily living including cooking, bathing, and clothing.  He lives by himself.  Apparently, the patient's last known stated normal was around 6 PM on 05/07/2022.  On the morning of 05/08/2022, the patient's son was trying to repeatedly called the patient with no answer.  He sent a neighbor to go check on the patient.  The patient was found on the floor stuck between the bed and the wall.  EMS was activated.  Code stroke was activated in route.  The patient was noted to have slurred speech with right gaze preference. At the time of my evaluation, the patient is unable to provide any history.  He arouses to protopathic stimuli but does not follow command nor cannot speak.   In the ED, the patient was afebrile but hemodynamically stable albeit with soft blood pressures.  WBC 10.8, hemoglobin 17.1, platelets 176,000.  Sodium 136, potassium 4.1, bicarbonate 22, serum creatinine 1.20.  LFTs were unremarkable.  CTA head neck  showed an acute right MCA infarct.  There is an occluded right common carotid and right internal carotid artery.  There was an occlusion of the left ICA also.  There is a 73 cc core infarct in the right MCA distribution. Neurology was consulted and did not feel the patient was a tPA candidate.  Did not feel the patient was candidate for any aggressive intervention.  Transfer to [facility] was requested for further evaluation and treatment.      1)Acute Stroke--- rather large right MCA territory stroke--- detailed above in brief summary section -Patient remains largely unresponsive -Neurology consult from Dr. Leonel Ramsay on 05/08/2022 and Dr. Rory Percy on 05/09/2022 appreciated--  -Family request transition to comfort care -Palliative care consult on 05/09/2022 appreciated -TOC team reaching out to hospice to see about disposition   2) new onset atrial fibrillation----comfort care   3)DM2-comfort care   4)COPD-comfort care   5)CKD 3A---- comfort care   6) underlying dementia--- comfort care   7)HTN--- comfort care   8) hypothyroidism--- comfort care   Await hospice  -Palliative care team following, care measures in place  Confirmed DNR/DNI  - Neurology progress note from Dr. Rory Percy on 05/09/2022 -Please see palliative care consult note from 05/09/2022   Consultants: Palliative care-hospice  Disposition: Hospice care Diet recommendation:  Discharge Diet Orders (From admission, onward)     Start     Ordered   05/10/22 0000  Diet - low sodium heart healthy        05/10/22 1158           Currently n.p.o.  but as tolerated DISCHARGE MEDICATION: Allergies as of 05/10/2022   No Known Allergies      Medication List     STOP taking these medications    Accu-Chek Guide test strip Generic drug: glucose blood   amLODipine 2.5 MG tablet Commonly known as: NORVASC   aspirin EC 81 MG tablet   carvedilol 6.25 MG tablet Commonly known as: COREG   donepezil 10 MG  tablet Commonly known as: Aricept   empagliflozin 10 MG Tabs tablet Commonly known as: Jardiance   famotidine 20 MG tablet Commonly known as: PEPCID   furosemide 40 MG tablet Commonly known as: LASIX   Januvia 100 MG tablet Generic drug: sitaGLIPtin   levothyroxine 137 MCG tablet Commonly known as: SYNTHROID   linaclotide 72 MCG capsule Commonly known as: Linzess   losartan 50 MG tablet Commonly known as: COZAAR   memantine 5 MG tablet Commonly known as: NAMENDA   nitroGLYCERIN 0.4 MG SL tablet Commonly known as: NITROSTAT   tiotropium 18 MCG inhalation capsule Commonly known as: Spiriva HandiHaler       TAKE these medications    glycopyrrolate 0.2 MG/ML injection Commonly known as: ROBINUL Inject 1 mL (0.2 mg total) into the skin every 4 (four) hours as needed (excessive secretions).   morphine (PF) 2 MG/ML injection Inject 0.5 mLs (1 mg total) into the vein every 2 (two) hours as needed (or dyspnea).        Discharge Exam: Filed Weights   05/08/22 2033  Weight: 69.2 kg   Patient was seen and examined, Limited exam patient is unconscious and not following command, not responding to pain stimuli Labored breathing   Condition at discharge: critical  The results of significant diagnostics from this hospitalization (including imaging, microbiology, ancillary and laboratory) are listed below for reference.   Imaging Studies: MR BRAIN WO CONTRAST  Result Date: 05/09/2022 CLINICAL DATA:  Neuro deficit, acute, stroke suspected. Left-sided weakness. History of colon cancer. EXAM: MRI HEAD WITHOUT CONTRAST TECHNIQUE: Multiplanar, multiecho pulse sequences of the brain and surrounding structures were obtained without intravenous contrast. COMPARISON:  Head CT, CTA, and CTP 05/08/2022 FINDINGS: Brain: There is a large acute infarct involving the right MCA superior division which is mildly larger than what was demonstrated on yesterday's CT and CTP (predominantly  the portion near the vertex). There is cytotoxic edema without significant mass effect. There is at most trace associated petechial hemorrhage. T2 hyperintensities elsewhere in the cerebral white matter bilaterally and in the pons are nonspecific but compatible with mild chronic small vessel ischemic disease. Mild cerebral atrophy is within normal limits for age. There is no extra-axial fluid collection. Vascular: Known bilateral ICA occlusion. Susceptibility near the right MCA bifurcation corresponding to the known superior M2 branch occlusion. Skull and upper cervical spine: No suspicious marrow lesion. Advanced disc and facet degeneration in the included cervical spine. Sinuses/Orbits: Bilateral cataract extraction. Persistent, essentially complete opacification of the left maxillary sinus with moderate mucosal thickening in the right maxillary sinus. Trace left mastoid fluid. Other: None. IMPRESSION: 1. Large acute right MCA infarct. No significant mass effect or hemorrhage. 2. Mild chronic small vessel ischemic disease. Electronically Signed   By: Logan Bores M.D.   On: 05/09/2022 08:45   CT HEAD CODE STROKE WO CONTRAST  Result Date: 05/08/2022 CLINICAL DATA:  Code stroke.  Left-sided weakness EXAM: CT ANGIOGRAPHY HEAD AND NECK CT PERFUSION BRAIN TECHNIQUE: Multidetector CT imaging of the head and neck was performed using the standard protocol during bolus  administration of intravenous contrast. Multiplanar CT image reconstructions and MIPs were obtained to evaluate the vascular anatomy. Carotid stenosis measurements (when applicable) are obtained utilizing NASCET criteria, using the distal internal carotid diameter as the denominator. Multiphase CT imaging of the brain was performed following IV bolus contrast injection. Subsequent parametric perfusion maps were calculated using RAPID software. RADIATION DOSE REDUCTION: This exam was performed according to the departmental dose-optimization program which  includes automated exposure control, adjustment of the mA and/or kV according to patient size and/or use of iterative reconstruction technique. CONTRAST:  155m OMNIPAQUE IOHEXOL 350 MG/ML SOLN COMPARISON:  CT head 04/22/2012 FINDINGS: CT HEAD FINDINGS Brain: There is hypodensity with loss of gray-white differentiation in the right MCA distribution involving the internal capsule and lentiform nucleus, external capsule/insular cortex, and frontal operculum. ASPECTS is 5. There is no acute intracranial hemorrhage or extra-axial fluid collection. Background parenchymal volume is normal. The ventricles are normal in size. There is no solid mass lesion. There is no mass effect or midline shift. Vascular: There is hyperdensity in the right sylvian fissure likely reflecting thrombus (2-12). Skull: Normal. Negative for fracture or focal lesion. Sinuses/Orbits: There is complete opacification of the left maxillary sinus and partial opacification of the right maxillary sinus with surrounding hyperostosis. There is layering fluid on the right. Bilateral lens implants are in place. The globes and orbits are otherwise unremarkable. Other: None. Review of the MIP images confirms the above findings CTA NECK FINDINGS Aortic arch: There is calcified plaque in the aortic arch. The origins of the major branch vessels are patent. There is moderate to severe stenosis of the right subclavian artery after the origin of the common carotid artery. The left subclavian artery is patent to the level imaged. Right carotid system: The right common carotid artery is occluded just after its origin. The internal carotid artery remains occluded throughout the neck. There is diminutive reconstitution of flow in the external carotid artery. Left carotid system: The left common carotid artery is patent. The internal carotid artery is occluded from just after the bifurcation throughout the remainder of the neck. The external carotid artery is patent  with severe stenosis of the origin. Vertebral arteries: There is mild stenosis of the origin of the right vertebral artery. The right vertebral artery is otherwise patent, without hemodynamically significant stenosis or occlusion. The left vertebral artery is patent with mild calcified plaque proximally but no hemodynamically significant stenosis or occlusion. There is no dissection or aneurysm. Skeleton: There is advanced multilevel disc space narrowing and degenerative endplate change throughout the cervical spine. There is no acute osseous abnormality or suspicious osseous lesion. There is no visible canal hematoma. Other neck: The soft tissues of the neck are unremarkable. Upper chest: The imaged lung apices are clear. Review of the MIP images confirms the above findings CTA HEAD FINDINGS Anterior circulation: The right intracranial ICA remains occluded through the communicating segment. There is thrombus in the communicating segment of the ICA extending to the right M1 segment which is nearly occlusive. The superior right M2 division is occluded. There is reconstitution of flow in the smaller inferior M2 branches, and reconstitution of flow in the superior M2 branch in the sylvian fissure (8-103, 10-68). Thrombus also extends into the right A1 segment for a short segment (8-104). The left intracranial ICA is occluded through the paraclinoid segment where there is reconstitution of flow. The left MCA is patent The ACAs are otherwise patent, without other hemodynamically significant stenosis or occlusion. The anterior communicating  artery is normal. There is an anteriorly directed outpouching arising from the anterior communicating artery measuring proximally 3 mm which may reflect a fenestration, though aneurysm is not excluded. Posterior circulation: The bilateral V4 segments are patent. The basilar artery is patent. The major cerebellar arteries appear patent. The PCAs are patent, without proximal stenosis or  occlusion. Bilateral posterior communicating arteries are identified. There is no aneurysm or AVM. Venous sinuses: Not well evaluated due to bolus timing. Anatomic variants: None. Review of the MIP images confirms the above findings CT Brain Perfusion Findings: ASPECTS: 5 CBF (<30%) Volume: 36m Perfusion (Tmax>6.0s) volume: 4793mMismatch Volume: 39715mnfarction Location:Right MCA territory. Note that the reported 470 cc of ischemic brain encompasses large portions of the bilateral cerebral hemispheres. IMPRESSION: 1. Acute right MCA infarct.  Aspects is 5. 2. Occluded right common and internal carotid arteries throughout the neck with nearly occlusive thrombus extending to the right M1 segment, and occlusive thrombus in the superior right M2 branch with reconstitution of flow distally in the sylvian fissure. Thrombus also extends for short-segment into the right A1 segment. 3. Occluded left internal carotid artery from the bifurcation throughout the communicating segment with reconstitution of flow in the MCA likely via collateral flow. 4. 73 cc infarct core identified in the right MCA distribution. 470 cc of ischemic brain is identified in both cerebral hemispheres likely related to bilateral internal carotid artery occlusion. 5. 3 mm outpouching arising from the anterior communicating artery may reflect a fenestration though a small aneurysm is not excluded. 6. Moderate to severe stenosis of the right subclavian artery. 7. Chronic bilateral maxillary sinus with near-complete opacification of the left and layering fluid on the right which could reflect superimposed acute sinusitis in the correct clinical setting. Findings of the noncontrast head CT and CTA were discussed with Dr. DicScot Dock 12:58 pm. Electronically Signed   By: PetValetta MoleD.   On: 05/08/2022 13:14   CT ANGIO HEAD NECK W WO CM W PERF (CODE STROKE)  Result Date: 05/08/2022 CLINICAL DATA:  Code stroke.  Left-sided weakness EXAM: CT  ANGIOGRAPHY HEAD AND NECK CT PERFUSION BRAIN TECHNIQUE: Multidetector CT imaging of the head and neck was performed using the standard protocol during bolus administration of intravenous contrast. Multiplanar CT image reconstructions and MIPs were obtained to evaluate the vascular anatomy. Carotid stenosis measurements (when applicable) are obtained utilizing NASCET criteria, using the distal internal carotid diameter as the denominator. Multiphase CT imaging of the brain was performed following IV bolus contrast injection. Subsequent parametric perfusion maps were calculated using RAPID software. RADIATION DOSE REDUCTION: This exam was performed according to the departmental dose-optimization program which includes automated exposure control, adjustment of the mA and/or kV according to patient size and/or use of iterative reconstruction technique. CONTRAST:  100m56mNIPAQUE IOHEXOL 350 MG/ML SOLN COMPARISON:  CT head 04/22/2012 FINDINGS: CT HEAD FINDINGS Brain: There is hypodensity with loss of gray-white differentiation in the right MCA distribution involving the internal capsule and lentiform nucleus, external capsule/insular cortex, and frontal operculum. ASPECTS is 5. There is no acute intracranial hemorrhage or extra-axial fluid collection. Background parenchymal volume is normal. The ventricles are normal in size. There is no solid mass lesion. There is no mass effect or midline shift. Vascular: There is hyperdensity in the right sylvian fissure likely reflecting thrombus (2-12). Skull: Normal. Negative for fracture or focal lesion. Sinuses/Orbits: There is complete opacification of the left maxillary sinus and partial opacification of the right maxillary sinus with surrounding hyperostosis. There  is layering fluid on the right. Bilateral lens implants are in place. The globes and orbits are otherwise unremarkable. Other: None. Review of the MIP images confirms the above findings CTA NECK FINDINGS Aortic  arch: There is calcified plaque in the aortic arch. The origins of the major branch vessels are patent. There is moderate to severe stenosis of the right subclavian artery after the origin of the common carotid artery. The left subclavian artery is patent to the level imaged. Right carotid system: The right common carotid artery is occluded just after its origin. The internal carotid artery remains occluded throughout the neck. There is diminutive reconstitution of flow in the external carotid artery. Left carotid system: The left common carotid artery is patent. The internal carotid artery is occluded from just after the bifurcation throughout the remainder of the neck. The external carotid artery is patent with severe stenosis of the origin. Vertebral arteries: There is mild stenosis of the origin of the right vertebral artery. The right vertebral artery is otherwise patent, without hemodynamically significant stenosis or occlusion. The left vertebral artery is patent with mild calcified plaque proximally but no hemodynamically significant stenosis or occlusion. There is no dissection or aneurysm. Skeleton: There is advanced multilevel disc space narrowing and degenerative endplate change throughout the cervical spine. There is no acute osseous abnormality or suspicious osseous lesion. There is no visible canal hematoma. Other neck: The soft tissues of the neck are unremarkable. Upper chest: The imaged lung apices are clear. Review of the MIP images confirms the above findings CTA HEAD FINDINGS Anterior circulation: The right intracranial ICA remains occluded through the communicating segment. There is thrombus in the communicating segment of the ICA extending to the right M1 segment which is nearly occlusive. The superior right M2 division is occluded. There is reconstitution of flow in the smaller inferior M2 branches, and reconstitution of flow in the superior M2 branch in the sylvian fissure (8-103, 10-68).  Thrombus also extends into the right A1 segment for a short segment (8-104). The left intracranial ICA is occluded through the paraclinoid segment where there is reconstitution of flow. The left MCA is patent The ACAs are otherwise patent, without other hemodynamically significant stenosis or occlusion. The anterior communicating artery is normal. There is an anteriorly directed outpouching arising from the anterior communicating artery measuring proximally 3 mm which may reflect a fenestration, though aneurysm is not excluded. Posterior circulation: The bilateral V4 segments are patent. The basilar artery is patent. The major cerebellar arteries appear patent. The PCAs are patent, without proximal stenosis or occlusion. Bilateral posterior communicating arteries are identified. There is no aneurysm or AVM. Venous sinuses: Not well evaluated due to bolus timing. Anatomic variants: None. Review of the MIP images confirms the above findings CT Brain Perfusion Findings: ASPECTS: 5 CBF (<30%) Volume: 30m Perfusion (Tmax>6.0s) volume: 4739mMismatch Volume: 3979mnfarction Location:Right MCA territory. Note that the reported 470 cc of ischemic brain encompasses large portions of the bilateral cerebral hemispheres. IMPRESSION: 1. Acute right MCA infarct.  Aspects is 5. 2. Occluded right common and internal carotid arteries throughout the neck with nearly occlusive thrombus extending to the right M1 segment, and occlusive thrombus in the superior right M2 branch with reconstitution of flow distally in the sylvian fissure. Thrombus also extends for short-segment into the right A1 segment. 3. Occluded left internal carotid artery from the bifurcation throughout the communicating segment with reconstitution of flow in the MCA likely via collateral flow. 4. 73 cc infarct core identified  in the right MCA distribution. 470 cc of ischemic brain is identified in both cerebral hemispheres likely related to bilateral internal  carotid artery occlusion. 5. 3 mm outpouching arising from the anterior communicating artery may reflect a fenestration though a small aneurysm is not excluded. 6. Moderate to severe stenosis of the right subclavian artery. 7. Chronic bilateral maxillary sinus with near-complete opacification of the left and layering fluid on the right which could reflect superimposed acute sinusitis in the correct clinical setting. Findings of the noncontrast head CT and CTA were discussed with Dr. Scot Dock at 12:58 pm. Electronically Signed   By: Valetta Mole M.D.   On: 05/08/2022 13:14    Microbiology: Results for orders placed or performed during the hospital encounter of 04/19/21  Urine Culture     Status: None   Collection Time: 04/19/21 10:53 PM   Specimen: Urine, Clean Catch  Result Value Ref Range Status   Specimen Description   Final    URINE, CLEAN CATCH Performed at [facility], [address], [place], [place] [address]    Special Requests   Final    NONE Performed at [facility], [address] [place], [place] [address]    Culture   Final    NO GROWTH Performed at [facility], [place] [address], [place], [place] [address]    Report Status 04/21/2021 FINAL  Final    Labs: CBC: Recent Labs  Lab 05/08/22 1228 05/08/22 1241  WBC 10.8*  --   NEUTROABS 9.9*  --   HGB 17.1* 18.4*  HCT 50.9 54.0*  MCV 96.8  --   PLT 176  --    Basic Metabolic Panel: Recent Labs  Lab 05/08/22 1228 05/08/22 1241  NA 136 138  K 4.1 4.3  CL 98 98  CO2 22  --   GLUCOSE 282* 271*  BUN 24* 26*  CREATININE 1.20 1.00  CALCIUM 9.5  --    Liver Function Tests: Recent Labs  Lab 05/08/22 1228  AST 27  ALT 15  ALKPHOS 58  BILITOT 1.5*  PROT 7.4  ALBUMIN 4.1   CBG: Recent Labs  Lab 05/08/22 2257 05/09/22 0411 05/09/22 0904 05/09/22 1208  GLUCAP 227* 212* 141* 160*    Discharge time spent: greater than 55 minutes.  Signed: Deatra Ethon, MD [facility] 05/10/2022

## 2022-05-17 ENCOUNTER — Ambulatory Visit: Payer: Medicare HMO | Admitting: Family Medicine

## 2022-06-05 DEATH — deceased

## 2022-06-26 IMAGING — DX DG SHOULDER 2+V*R*
3 series · 3 of 3 positions shown · non-contrast
Comparison: None.

CLINICAL DATA: Right shoulder pain related to a fall.

EXAM:
RIGHT SHOULDER - 2+ VIEW

[shoulder ap]
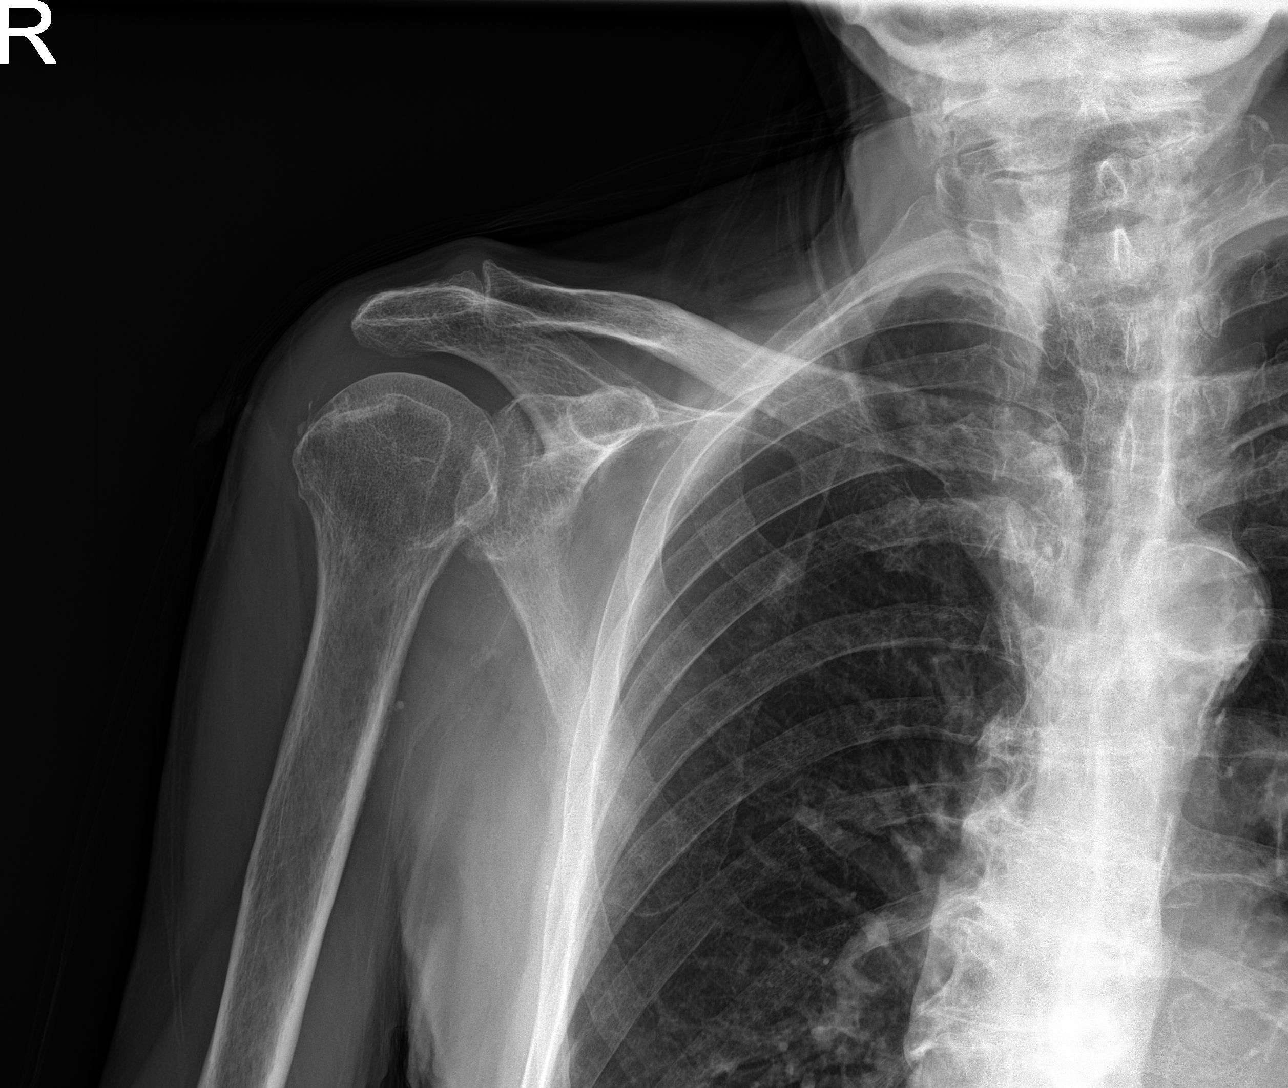

[shoulder obl]
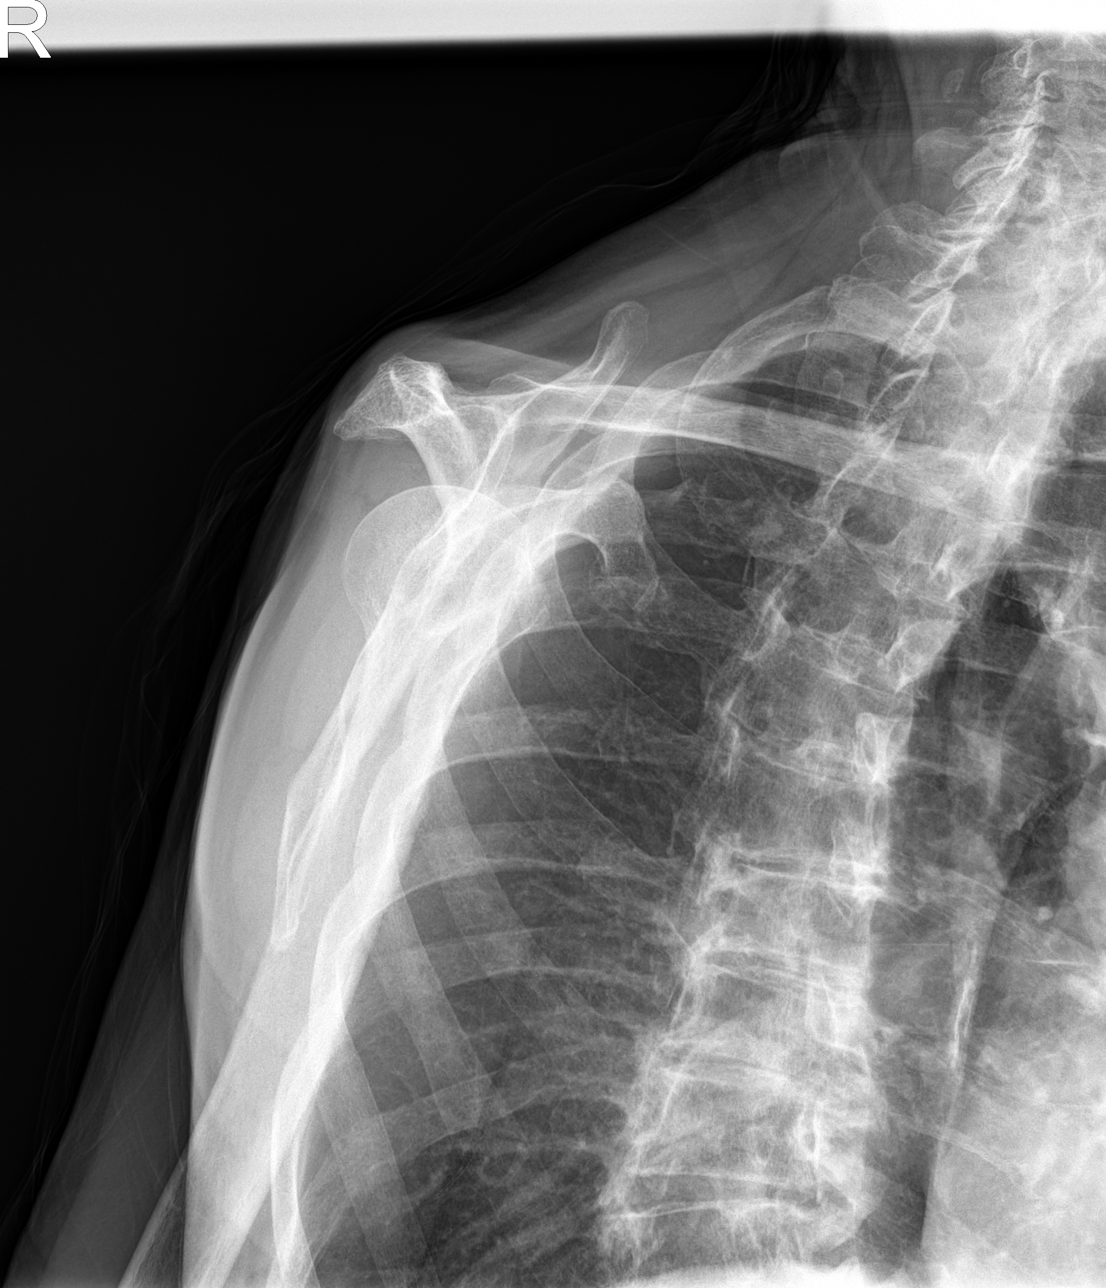

[shoulder axial]
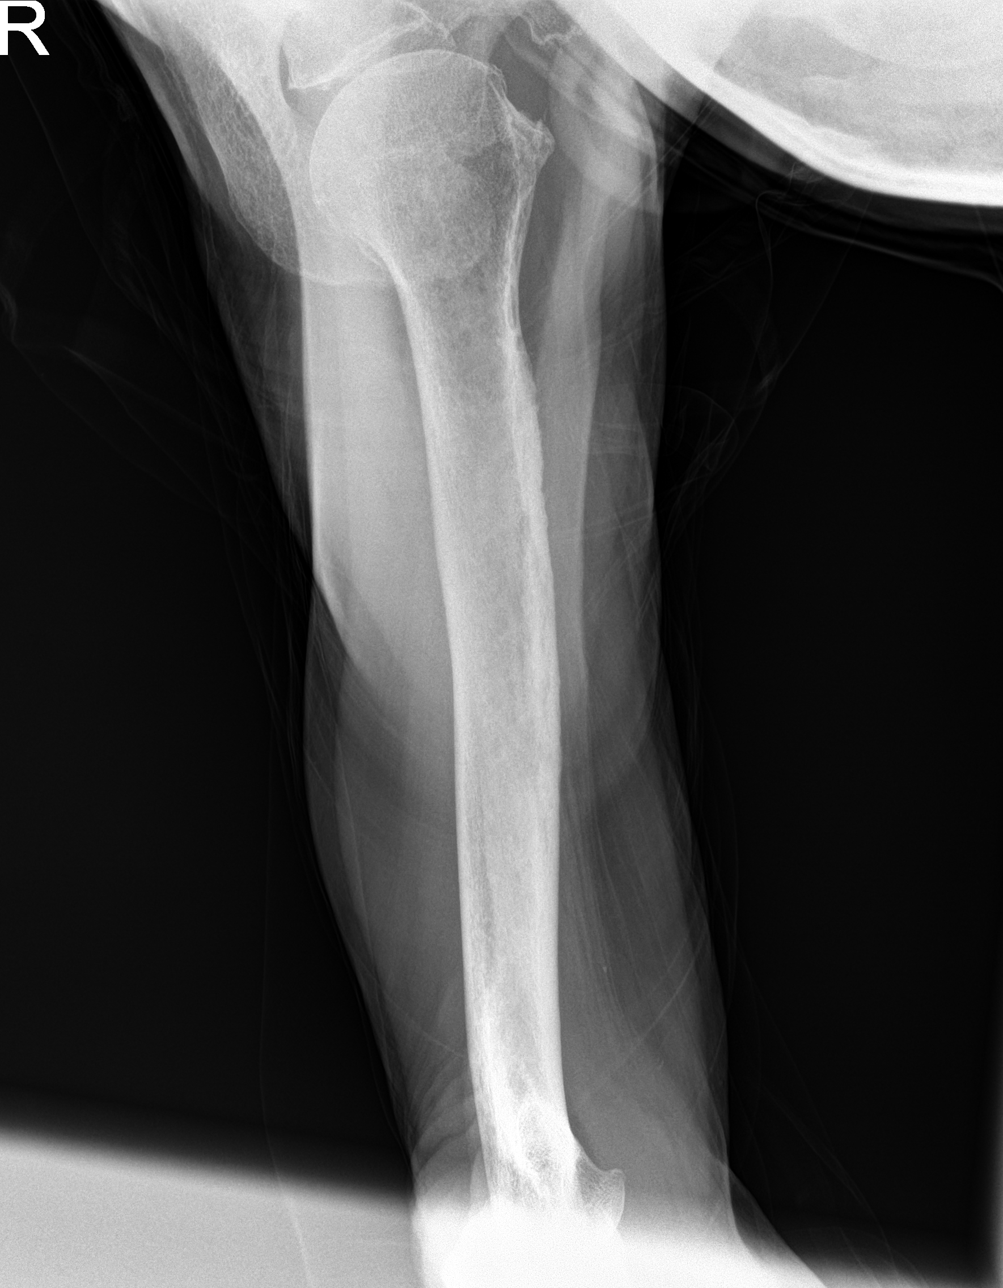

[3 of 3 positions shown; findings below may reference images not displayed]

FINDINGS: There is no evidence for acute fracture or dislocation. There is
mild glenohumeral and acromioclavicular joint space narrowing
compatible with degenerative change. There is soft tissue
calcifications adjacent to the greater tuberosity likely related to
calcific tendinitis. Soft tissues are otherwise within normal
limits.
IMPRESSION: 1. No acute bony abnormality.
2. Mild degenerative changes of the right shoulder.
3. Calcific tendinitis.
